# Patient Record
Sex: Female | Born: 1950
Health system: Southern US, Community
[De-identification: ages and names within clinical notes are randomized; demographics above are authoritative.]

## PROBLEM LIST (undated history)

## (undated) DIAGNOSIS — N2889 Other specified disorders of kidney and ureter: Secondary | ICD-10-CM

## (undated) DIAGNOSIS — N200 Calculus of kidney: Secondary | ICD-10-CM

## (undated) DIAGNOSIS — K5730 Diverticulosis of large intestine without perforation or abscess without bleeding: Secondary | ICD-10-CM

## (undated) DIAGNOSIS — I10 Essential (primary) hypertension: Secondary | ICD-10-CM

## (undated) DIAGNOSIS — K648 Other hemorrhoids: Secondary | ICD-10-CM

## (undated) DIAGNOSIS — K644 Residual hemorrhoidal skin tags: Secondary | ICD-10-CM

## (undated) DIAGNOSIS — K7689 Other specified diseases of liver: Secondary | ICD-10-CM

## (undated) DIAGNOSIS — E119 Type 2 diabetes mellitus without complications: Secondary | ICD-10-CM

## (undated) DIAGNOSIS — R011 Cardiac murmur, unspecified: Secondary | ICD-10-CM

## (undated) HISTORY — DX: Calculus of kidney: N20.0

## (undated) HISTORY — DX: Other hemorrhoids: K64.8

## (undated) HISTORY — DX: Type 2 diabetes mellitus without complications: E11.9

## (undated) HISTORY — DX: Diverticulosis of large intestine without perforation or abscess without bleeding: K57.30

## (undated) HISTORY — DX: Cardiac murmur, unspecified: R01.1

## (undated) HISTORY — DX: Residual hemorrhoidal skin tags: K64.4

## (undated) HISTORY — DX: Other specified diseases of liver: K76.89

## (undated) HISTORY — DX: Other specified disorders of kidney and ureter: N28.89

---

## 1981-02-24 HISTORY — PX: LITHOTRIPSY: SUR834

## 1983-02-25 HISTORY — PX: BREAST REDUCTION SURGERY: SHX8

## 1989-02-24 HISTORY — PX: ABDOMINAL HYSTERECTOMY: SHX81

## 1998-03-01 ENCOUNTER — Other Ambulatory Visit: Admission: RE | Admit: 1998-03-01 | Discharge: 1998-03-01 | Payer: Self-pay | Admitting: Obstetrics and Gynecology

## 1998-04-30 ENCOUNTER — Ambulatory Visit (HOSPITAL_COMMUNITY): Admission: RE | Admit: 1998-04-30 | Discharge: 1998-04-30 | Payer: Self-pay | Admitting: Gastroenterology

## 1998-10-09 ENCOUNTER — Encounter: Payer: Self-pay | Admitting: Gastroenterology

## 1998-10-09 ENCOUNTER — Ambulatory Visit (HOSPITAL_COMMUNITY): Admission: RE | Admit: 1998-10-09 | Discharge: 1998-10-09 | Payer: Self-pay | Admitting: Gastroenterology

## 1999-02-25 HISTORY — PX: HAND SURGERY: SHX662

## 2003-02-25 HISTORY — PX: LASIK: SHX215

## 2003-07-10 ENCOUNTER — Ambulatory Visit (HOSPITAL_COMMUNITY): Admission: RE | Admit: 2003-07-10 | Discharge: 2003-07-10 | Payer: Self-pay | Admitting: Gastroenterology

## 2005-09-26 ENCOUNTER — Encounter: Admission: RE | Admit: 2005-09-26 | Discharge: 2005-09-26 | Payer: Self-pay | Admitting: Gastroenterology

## 2005-10-03 ENCOUNTER — Encounter: Admission: RE | Admit: 2005-10-03 | Discharge: 2005-10-03 | Payer: Self-pay | Admitting: Gastroenterology

## 2008-03-27 ENCOUNTER — Emergency Department (HOSPITAL_COMMUNITY): Admission: EM | Admit: 2008-03-27 | Discharge: 2008-03-27 | Payer: Self-pay | Admitting: Emergency Medicine

## 2008-03-28 ENCOUNTER — Emergency Department (HOSPITAL_COMMUNITY): Admission: EM | Admit: 2008-03-28 | Discharge: 2008-03-28 | Payer: Self-pay | Admitting: Emergency Medicine

## 2008-07-25 ENCOUNTER — Encounter: Admission: RE | Admit: 2008-07-25 | Discharge: 2008-07-25 | Payer: Self-pay | Admitting: Gastroenterology

## 2009-03-13 ENCOUNTER — Encounter: Admission: RE | Admit: 2009-03-13 | Discharge: 2009-03-13 | Payer: Self-pay | Admitting: Otolaryngology

## 2010-06-11 LAB — HEMOGLOBIN AND HEMATOCRIT, BLOOD
HCT: 35.7 % — ABNORMAL LOW (ref 36.0–46.0)
Hemoglobin: 11.6 g/dL — ABNORMAL LOW (ref 12.0–15.0)

## 2010-07-12 NOTE — Op Note (Signed)
NAME:  Shannon Craig, Shannon Craig                           ACCOUNT NO.:  [id number]   MEDICAL RECORD NO.:  [id number]                   PATIENT TYPE:  AMB   LOCATION:  ENDO                                 FACILITY:  [facility]   PHYSICIAN:  Petra Kuba, M.D.                 DATE OF BIRTH:  10-02-1950   DATE OF PROCEDURE:  07/10/2003  DATE OF DISCHARGE:                                 OPERATIVE REPORT   PROCEDURE:  Colonoscopy.   ENDOSCOPIST:  Petra Kuba, M.D.   INDICATION:  The patient with questionable history of colon polyps, due for  repeat screening.  Consent was signed after risks, benefits, methods, and  options were thoroughly discussed multiple times in the past.   MEDICINES USED:  Demerol 90, Versed 9.   DESCRIPTION OF PROCEDURE:  Rectal inspection was pertinent for external  hemorrhoids, small.  Digital exam was negative.  Pediatric video adjustable  colonoscope was inserted and with some looping was able to be advanced to  the mid-transverse.  At that point, the patient was rolled on her back.  With abdominal pressure, we were able to advance to the hepatic flexure  which was very tortuous, but we were able to advance around it to the  ascending colon and to the cecum which was identified by the appendiceal  orifice and the ileocecal valve which was slightly lipomatous.  The scope  was then slowly withdrawn.  There was a rare right-sided diverticula with  some stool in the right side which could not be washed off.  Her hepatic  flexure was tortuous, but no other abnormalities were seen.  The scope was  further withdrawn.  Other than some scope trauma on the left side seen on  insertion primarily in the sigmoid, no other abnormalities were seen as we  slowly withdrew back to the rectum.  Anorectal pull through in retroflexion  confirmed some small hemorrhoids.  The scope was reinserted a short ways up  the left side of the colon. Air was suctioned, and the scope removed.  The  patient tolerated the procedure well.  There was no obvious immediate  complication.   ENDOSCOPIC DIAGNOSES:  1. Internal and external hemorrhoids.  2. Scope trauma in the sigmoid.  3. Hepatic flexure tortuosity.  4. Few right-sided diverticula.  5. Otherwise within normal limits to the cecum.   PLAN:  1. Yearly rectals and guaiac per the [facility] doctor.  2. Happy to see back p.r.n.  3. Repeat screening in five years.  Consider colonoscopy at that junction if     covered by insurance.                                               Petra Kuba, M.D.  MEM/MEDQ  D:  07/10/2003  T:  07/10/2003  Job:  [id number]   cc:   Daphene Jaeger, M.D.  [facility]

## 2010-12-09 ENCOUNTER — Other Ambulatory Visit: Payer: Self-pay | Admitting: Surgery

## 2011-09-03 ENCOUNTER — Encounter: Payer: Self-pay | Admitting: Gastroenterology

## 2011-09-03 ENCOUNTER — Ambulatory Visit (INDEPENDENT_AMBULATORY_CARE_PROVIDER_SITE_OTHER): Payer: 59 | Admitting: Gastroenterology

## 2011-09-03 VITALS — BP 134/86 | HR 88 | Ht <= 58 in | Wt 139.1 lb

## 2011-09-03 DIAGNOSIS — R933 Abnormal findings on diagnostic imaging of other parts of digestive tract: Secondary | ICD-10-CM

## 2011-09-03 NOTE — Patient Instructions (Addendum)
We will get copies of any recent liver tests through Dr. Juleen China, and Dr. Providence Lanius at [facility] (very interested in any liver tests).  You will repeat labs if those are not complete (AFP, LFTs, cbc). A copy of this information will be made available to Dr. Juleen China, Dr. Providence Lanius, Dr. Ewing Schlein. We will get copies of recent MRI, ultrasound to Radiologist in [place] to review, comparing to old scans of liver. Will call with results.

## 2011-09-03 NOTE — Progress Notes (Addendum)
HPI: This is a   very pleasant 61 year old woman whom I am meeting for the first time today.  She is along term patient of Dr. Ewing Schlein and plans to continue seeing him for colon cancer screening in the future.  She had a colonoscopy with Dr. Ewing Schlein in 06/2003, this was done for "questionable history of polyps in the past".    No polyps were found, he describes a tortuous colon, right sided tics. He recommended yearly Hemoccult tests following the examination as well as "repeat screening in 5 years, consider colonosocpy."  She has had abnormal lesions in her liver dating at least as far back as 2007 based on imaging I have available in this electronic medical record. Back in 2007 a CT scan describes multiple complex cysts in her liver. Most recently she had an MRI of her abdomen (07/2011). This also describes multiple complex hepatic nodules with indeterminate MR imaging characteristics. The largest is 4.6 x 5.7 cm, located at the hepatic dome. I only have the report here, not the images. The report does state that there is some fluid signal intensity within this lesion.  She never heard about previous scans, liver lesions.  Just now knows about liver lesions.  Never had jaundice or hepatitis.  Overall her weight is decreasing (trying to lose weight 40 pounds in 1.5 years).  No liver disease in family.   6-7 weeks ago she had very high fever 102.7, was found to have UTI.  Pain RUQ, thought maybe to have GB issue.  She had [place], then MRI follow up.  She was given z pack at first, then amoxicillin when UTI culture was back.  She tells me that she had abnormal liver tests as well however I have no record of any lab tests being done in the past 2-3 years.  An ultrasound of her abdomen done last month shows normal gallbladder. Multifocal hepatic nodules that were "not indicative of simple cysts"    Review of systems: Pertinent positive and negative review of systems were noted in the above HPI section.  Complete review of systems was performed and was otherwise normal.    Past Medical History  Diagnosis Date  . Internal hemorrhoid   . External hemorrhoid   . Diverticula of colon   . Diabetes mellitus   . Nodule of kidney   . Liver nodule   . Kidney stones     Past Surgical History  Procedure Date  . Lithotripsy 1983  . Breast reduction surgery 1985  . Abdominal hysterectomy 1991  . Hand surgery 2001    left, pins  . Lasik 2005    bilateral    Current Outpatient Prescriptions  Medication Sig Dispense Refill  . Calcium-Vitamin D-Vitamin K (VIACTIV PO) Take 1 tablet by mouth daily.      . Multiple Vitamin (MULTIVITAMIN) tablet Take 1 tablet by mouth daily.        Allergies as of 09/03/2011 - Review Complete 09/03/2011  Allergen Reaction Noted  . Darvocet (propoxyphene-acetaminophen)  09/03/2011  . Percodan (oxycodone-aspirin)  09/03/2011    Family History  Problem Relation Age of Onset  . Breast cancer Mother   . Diabetes Father   . Prostate cancer Maternal Uncle   . Stroke Maternal Grandmother   . Heart attack Maternal Grandfather   . Diabetes Brother     History   Social History  . Marital Status: Married    Spouse Name: N/A    Number of Children: 0  .  Years of Education: N/A   Occupational History  . housewife    Social History Main Topics  . Smoking status: Never Smoker   . Smokeless tobacco: Never Used  . Alcohol Use: No  . Drug Use: No  . Sexually Active: Not on file   Other Topics Concern  . Not on file   Social History Narrative  . No narrative on file       Physical Exam: BP 134/86  Pulse 88  Ht 4\' 9"  (1.448 m)  Wt 139 lb 2 oz (63.107 kg)  BMI 30.11 kg/m2 Constitutional: generally well-appearing Psychiatric: alert and oriented x3 Eyes: extraocular movements intact Mouth: oral pharynx moist, no lesions Neck: supple no lymphadenopathy Cardiovascular: heart regular rate and rhythm Lungs: clear to auscultation  bilaterally Abdomen: soft, nontender, nondistended, no obvious ascites, no peritoneal signs, normal bowel sounds Extremities: no lower extremity edema bilaterally Skin: no lesions on visible extremities    Assessment and plan: 61 y.o. female with  abnormal lesions in the liver  She has an abnormal liver dating back at least to 2007. I am going to have the films on disc which she brought over reviewed by one of the local radiologist, Dr. Jeronimo Greaves so that he may compare the new images with the old images that we have available here. I explained to her that my suspicion for cancer is low. I do think however it is interesting that she had high fevers, right upper quadrant pain around that time that this was discovered. She has always had several smaller cysts in the liver dating back to 2007 but the recent MRI suggests a larger process, somewhat fluid filled. This is up to 5-6 cm in size. Perhaps this was a hepatic abscess. I would've expected her to become a bit sicker and not to respond to Z-Pak or amoxicillin like she did. She was found instead to have a urinary tract infection.    I reviewed liver tests done early June 2013 and these were all completely normal.

## 2011-09-15 ENCOUNTER — Telehealth: Payer: Self-pay | Admitting: Gastroenterology

## 2011-09-15 NOTE — Telephone Encounter (Signed)
Please call her.  Apologize for the delay (vacation last week).  Let her know I am going to be reviewing the liver images with radiologist at [facility] on Wednesday or Thursday (she is out of town today and tomorrow). Will contact her after that.

## 2011-09-17 NOTE — Telephone Encounter (Signed)
Pt.notified

## 2011-09-19 ENCOUNTER — Telehealth: Payer: Self-pay | Admitting: Gastroenterology

## 2011-09-19 DIAGNOSIS — K769 Liver disease, unspecified: Secondary | ICD-10-CM

## 2011-09-19 NOTE — Telephone Encounter (Signed)
Pt has been notified disc has been sent interoffice mail to [facility] radiology scheduling pt will come in on Mon for labs

## 2011-09-19 NOTE — Telephone Encounter (Signed)
Number given to Dr Christella Hartigan regarding radiologist and info on pt

## 2011-09-19 NOTE — Telephone Encounter (Signed)
I spoke with Dr. Vear Clock, radiology.  She compared to old films.  The larger lesion (dome of liver 5-6cm is predominantly solid and is new).    Patty, Can you forward the discs on my desk over to interventional radiology so that they can review and consider biopsy of the new liver lesion.  She also needs labs (AFP, CEA, CA 19-9, LFTs), she is planning to come Monday for the bloodwork.  Thanks

## 2011-09-19 NOTE — Telephone Encounter (Signed)
Left message on machine to call back  

## 2011-09-19 NOTE — Telephone Encounter (Signed)
Patty, Can you please contact [facility] about this patient.  [phone].  I spoke with a woman there earlier this week who said a radiologist was going to be comparing her [facility] (recent) tests to her older scans done through [facility]. I was told the radiologist would be contacting me Wednesday or Thursday but have not heard from anyone yet.  Thanks

## 2011-09-22 ENCOUNTER — Other Ambulatory Visit: Payer: Self-pay

## 2011-09-22 ENCOUNTER — Other Ambulatory Visit (INDEPENDENT_AMBULATORY_CARE_PROVIDER_SITE_OTHER): Payer: 59

## 2011-09-22 DIAGNOSIS — K769 Liver disease, unspecified: Secondary | ICD-10-CM

## 2011-09-22 DIAGNOSIS — K7689 Other specified diseases of liver: Secondary | ICD-10-CM

## 2011-09-22 LAB — AFP TUMOR MARKER: AFP-Tumor Marker: 2.4 ng/mL (ref 0.0–8.0)

## 2011-09-22 LAB — HEPATIC FUNCTION PANEL
ALT: 17 U/L (ref 0–35)
AST: 18 U/L (ref 0–37)
Bilirubin, Direct: 0.1 mg/dL (ref 0.0–0.3)
Total Bilirubin: 0.6 mg/dL (ref 0.3–1.2)

## 2011-09-24 ENCOUNTER — Other Ambulatory Visit: Payer: Self-pay

## 2011-09-24 DIAGNOSIS — K769 Liver disease, unspecified: Secondary | ICD-10-CM

## 2011-09-25 ENCOUNTER — Other Ambulatory Visit (INDEPENDENT_AMBULATORY_CARE_PROVIDER_SITE_OTHER): Payer: 59

## 2011-09-25 ENCOUNTER — Telehealth: Payer: Self-pay

## 2011-09-25 DIAGNOSIS — K769 Liver disease, unspecified: Secondary | ICD-10-CM

## 2011-09-25 DIAGNOSIS — K7689 Other specified diseases of liver: Secondary | ICD-10-CM

## 2011-09-25 LAB — BUN: BUN: 11 mg/dL (ref 6–23)

## 2011-09-25 LAB — CREATININE, SERUM: Creatinine, Ser: 0.7 mg/dL (ref 0.4–1.2)

## 2011-09-25 NOTE — Telephone Encounter (Signed)
Patient came by to report her medications.  She is on a new invokana 300 mg 1 po qd.  This is a new diabetes medication.  Per Rose at CT because this is not a metformin class of medication it does not need held for CT scan.  Patient is advised

## 2011-09-26 ENCOUNTER — Other Ambulatory Visit: Payer: 59

## 2011-09-26 ENCOUNTER — Ambulatory Visit (INDEPENDENT_AMBULATORY_CARE_PROVIDER_SITE_OTHER)
Admission: RE | Admit: 2011-09-26 | Discharge: 2011-09-26 | Disposition: A | Discharge status: Home / Self Care | Payer: 59 | Source: Ambulatory Visit | Attending: Gastroenterology | Admitting: Gastroenterology

## 2011-09-26 DIAGNOSIS — K7689 Other specified diseases of liver: Secondary | ICD-10-CM

## 2011-09-26 DIAGNOSIS — K769 Liver disease, unspecified: Secondary | ICD-10-CM

## 2011-09-26 MED ORDER — IOHEXOL 350 MG/ML SOLN
100.0000 mL | Freq: Once | INTRAVENOUS | Status: AC | PRN
  Administered 2011-09-26: 100 mL via INTRAVENOUS

## 2011-12-01 ENCOUNTER — Telehealth: Payer: Self-pay

## 2011-12-01 DIAGNOSIS — K769 Liver disease, unspecified: Secondary | ICD-10-CM

## 2011-12-01 NOTE — Telephone Encounter (Signed)
Message copied by Donata Duff on Mon Dec 01, 2011  8:22 AM ------      Message from: Donata Duff      Created: Tue Sep 30, 2011 10:15 AM       MRI of the liver in 2 months

## 2011-12-02 NOTE — Telephone Encounter (Signed)
Pt has been scheduled for an MRI liver 12/09/11 1145 am arrival NPO midnight pt notified of the instructions

## 2011-12-09 ENCOUNTER — Ambulatory Visit (HOSPITAL_COMMUNITY): Payer: 59

## 2012-09-29 ENCOUNTER — Telehealth: Payer: Self-pay

## 2012-09-29 DIAGNOSIS — K769 Liver disease, unspecified: Secondary | ICD-10-CM

## 2012-09-29 NOTE — Telephone Encounter (Signed)
Message copied by Donata Duff on Wed Sep 29, 2012  8:18 AM ------      Message from: Donata Duff      Created: Tue Sep 30, 2011 10:15 AM       chest CT scan in 12 months ------

## 2012-09-29 NOTE — Telephone Encounter (Signed)
Left message on machine to call back  

## 2012-09-30 ENCOUNTER — Other Ambulatory Visit: Payer: Self-pay

## 2012-09-30 DIAGNOSIS — K769 Liver disease, unspecified: Secondary | ICD-10-CM

## 2012-09-30 DIAGNOSIS — R911 Solitary pulmonary nodule: Secondary | ICD-10-CM

## 2012-09-30 NOTE — Telephone Encounter (Signed)
Leb CT chest 10/01/12 10 am NPO 2 hours The patient has been notified of this information and all questions answered.

## 2012-10-01 ENCOUNTER — Ambulatory Visit (INDEPENDENT_AMBULATORY_CARE_PROVIDER_SITE_OTHER)
Admission: RE | Admit: 2012-10-01 | Discharge: 2012-10-01 | Disposition: A | Discharge status: Home / Self Care | Payer: 59 | Source: Ambulatory Visit | Attending: Gastroenterology | Admitting: Gastroenterology

## 2012-10-01 ENCOUNTER — Other Ambulatory Visit: Payer: 59

## 2012-10-01 DIAGNOSIS — R911 Solitary pulmonary nodule: Secondary | ICD-10-CM

## 2012-10-01 DIAGNOSIS — J984 Other disorders of lung: Secondary | ICD-10-CM

## 2012-10-01 MED ORDER — IOHEXOL 300 MG/ML  SOLN
80.0000 mL | Freq: Once | INTRAMUSCULAR | Status: AC | PRN
  Administered 2012-10-01: 80 mL via INTRAVENOUS

## 2012-11-10 ENCOUNTER — Other Ambulatory Visit (INDEPENDENT_AMBULATORY_CARE_PROVIDER_SITE_OTHER): Payer: 59

## 2012-11-10 ENCOUNTER — Encounter: Payer: Self-pay | Admitting: Gastroenterology

## 2012-11-10 ENCOUNTER — Ambulatory Visit (INDEPENDENT_AMBULATORY_CARE_PROVIDER_SITE_OTHER): Payer: 59 | Admitting: Gastroenterology

## 2012-11-10 VITALS — BP 120/70 | HR 83 | Ht <= 58 in | Wt 144.0 lb

## 2012-11-10 DIAGNOSIS — K7689 Other specified diseases of liver: Secondary | ICD-10-CM

## 2012-11-10 DIAGNOSIS — R911 Solitary pulmonary nodule: Secondary | ICD-10-CM

## 2012-11-10 DIAGNOSIS — J984 Other disorders of lung: Secondary | ICD-10-CM

## 2012-11-10 DIAGNOSIS — K769 Liver disease, unspecified: Secondary | ICD-10-CM

## 2012-11-10 LAB — HEPATIC FUNCTION PANEL
ALT: 18 U/L (ref 0–35)
Albumin: 3.6 g/dL (ref 3.5–5.2)
Total Bilirubin: 0.7 mg/dL (ref 0.3–1.2)

## 2012-11-10 NOTE — Progress Notes (Signed)
Review of pertinent gastrointestinal problems: 1. Abnormal liver on imaging: She has had abnormal lesions in her liver dating at least as far back as 2007 based on imaging I have available in this electronic medical record. Back in 2007 a CT scan describes multiple complex cysts in her liver. Most recently she had an MRI of her abdomen (07/2011). This also describes multiple complex hepatic nodules with indeterminate MR imaging characteristics. The largest is 4.6 x 5.7 cm, located at the hepatic dome. I only have the report here, not the images. The report does state that there is some fluid signal intensity within this lesion.  Labs 2013: lfts, afp all normal.  CT scan 2013: I discussed the CT scan results with radiologist in [place] . It seems that there has probably actually been no change in the liver lesions in 5 or 6 years. The quality of the [facility] MRI was brought into question. To be safe, radiologists here recommended repeat MRI of the liver in 2 months (patient cancelled this for cost reasons).  2. 2013 lung lesion on CT: Also the CT scan that was just recently done showed a very small lung nodule, radiologist recommended repeat CT scan of the chest in 12 months time since she has no risk factors (she does not smoke cigarettes and no family history of lung cancer).  Repeat CT scan chest 2 months later showed stable lesions, recommended repeat CT in 12 months.  09/2012: lung lesions no changes (8mm by 6mm), again radiologist recommended repeat CT in 12 months. 3. colonoscopy with Dr. Ewing Schlein in 06/2003, this was done for "questionable history of polyps in the past". No polyps were found, he describes a tortuous colon, right sided tics. He recommended yearly Hemoccult tests following the examination as well as "repeat screening in 5 years, consider colonosocpy."   HPI: This is a  very pleasant 62 year old woman whom I last saw about a year ago.  I have been following abnormal liver, also incidentally  noted lung lesion.  She feels absolutely fine. No pulmonary symptoms, no abdominal symptoms.  Stable weight.  No pains.  No overt gi bleeding.  Breathing comfortably.  Non smoker.    Bowels normal for her.   Past Medical History  Diagnosis Date  . Internal hemorrhoid   . External hemorrhoid   . Diverticula of colon   . Diabetes mellitus   . Nodule of kidney   . Liver nodule   . Kidney stones     Past Surgical History  Procedure Laterality Date  . Lithotripsy  1983  . Breast reduction surgery  1985  . Abdominal hysterectomy  1991  . Hand surgery  2001    left, pins  . Lasik  2005    bilateral    Current Outpatient Prescriptions  Medication Sig Dispense Refill  . Alogliptin Benzoate (NESINA) 25 MG TABS Take by mouth.      Marland Kitchen atorvastatin (LIPITOR) 40 MG tablet Take 40 mg by mouth daily.      . Calcium-Vitamin D-Vitamin K (VIACTIV PO) Take 1 tablet by mouth daily.      . Canagliflozin (INVOKANA) 300 MG TABS Take 300 mg by mouth daily.  30 tablet    . meloxicam (MOBIC) 15 MG tablet       . Multiple Vitamin (MULTIVITAMIN) tablet Take 1 tablet by mouth daily.      . ramipril (ALTACE) 5 MG capsule Take 5 mg by mouth daily.       No current  facility-administered medications for this visit.    Allergies as of 11/10/2012 - Review Complete 11/10/2012  Allergen Reaction Noted  . Darvocet [propoxyphene-acetaminophen]  09/03/2011  . Gadolinium derivatives Hives 09/25/2011  . Percodan [oxycodone-aspirin]  09/03/2011    Family History  Problem Relation Age of Onset  . Breast cancer Mother   . Diabetes Father   . Prostate cancer Maternal Uncle   . Stroke Maternal Grandmother   . Heart attack Maternal Grandfather   . Diabetes Brother     History   Social History  . Marital Status: Married    Spouse Name: N/A    Number of Children: 0  . Years of Education: N/A   Occupational History  . housewife    Social History Main Topics  . Smoking status: Never Smoker   .  Smokeless tobacco: Never Used  . Alcohol Use: No  . Drug Use: No  . Sexual Activity: Not on file   Other Topics Concern  . Not on file   Social History Narrative  . No narrative on file      Physical Exam: BP 120/70  Pulse 83  Ht 4\' 9"  (1.448 m)  Wt 144 lb (65.318 kg)  BMI 31.15 kg/m2  SpO2 97% Constitutional: generally well-appearing Psychiatric: alert and oriented x3 Abdomen: soft, nontender, nondistended, no obvious ascites, no peritoneal signs, normal bowel sounds     Assessment and plan: 62 y.o. female with abnormal liver on imaging, small lung lesion incidentally noted on imaging   She has had an abnormal appearing liver since at least 2007. most recent CT scan it was actually done of her chest seems to suggest that the liver lesions are resolving or at least not growing although admittedly it was not a dedicated liver scan. I spoke with radiologist about her imaging of the liver in 2013. I suspect that these are not neoplastic lesions. She cannot afford MRI in 2013 and knows she cannot afford one now either. I think given the stability of the lesions over 7 years that we can stop imaging for now. I will however get a set of liver tests and alpha-fetoprotein today. The lung lesion is subcentimeter, radiologists recommend repeat CT scan H. 2015 I will set that up for her. She had colon cancer screening with colonoscopy with Dr.  Ewing Schlein in the past. We will get those records sent over here for review to determine when she is due for next colon cancer screening.

## 2012-11-10 NOTE — Patient Instructions (Addendum)
You will have labs checked today in the basement lab.  Please head down after you check out with the front desk  (lfts, AFP). We will call to Dr. Marlane Hatcher office to get your previous colonoscopy reports and any associated pathology.  Dr. Christella Hartigan will review those records to determine when you need repeat colon cancer screening. Will remind you about repeat CT scan of chest in 09/2013.

## 2012-11-11 LAB — AFP TUMOR MARKER: AFP-Tumor Marker: 1.4 ng/mL (ref 0.0–8.0)

## 2012-12-20 ENCOUNTER — Telehealth: Payer: Self-pay | Admitting: Gastroenterology

## 2012-12-20 NOTE — Telephone Encounter (Signed)
Colonoscopy, Dr. Ewing Schlein 04/1998; done for guiac pos stool and 'almost due for colonic screening.' Findings internal hemorrhoids, scope trauma to sigmoid, no polyps Colonoscopy, Dr. Ewing Schlein 06/2003; for "questionable history of colon polyps." findings hemorrhoids, tics, no polyps. Colonoscopy, Dr. Ewing Schlein 08/2008; for "personal history of colonic polyps, IDA." Findings hemorrhoids, tics, no polyps.  Recommended; repeat colonoscopy in 5 years.  Shannon Craig, Please call her.  I cannot find any documentation of previous adenomatous polyps.  She is at routine risk for colon cancer screening and needs recall colonoscopy 08/2018.  Thanks

## 2012-12-20 NOTE — Telephone Encounter (Signed)
Recall has been added to EPIC Left message on machine to call back  

## 2012-12-21 NOTE — Telephone Encounter (Signed)
The patient has been notified of this information and all questions answered.

## 2012-12-21 NOTE — Telephone Encounter (Signed)
Left message on machine to call back  

## 2013-09-26 ENCOUNTER — Telehealth: Payer: Self-pay

## 2013-09-26 NOTE — Telephone Encounter (Signed)
Message copied by Barron Alvine on Mon Sep 26, 2013  8:15 AM ------      Message from: Barron Alvine      Created: Wed Nov 10, 2012  9:00 AM       Pt needs CT chest 09/2013 see 11/10/12 note  ------

## 2013-09-28 NOTE — Telephone Encounter (Signed)
The pt wants to call back to set up appt for CT of the chest

## 2013-09-30 ENCOUNTER — Other Ambulatory Visit: Payer: Self-pay | Admitting: Gastroenterology

## 2013-09-30 DIAGNOSIS — R911 Solitary pulmonary nodule: Secondary | ICD-10-CM

## 2013-09-30 DIAGNOSIS — R932 Abnormal findings on diagnostic imaging of liver and biliary tract: Secondary | ICD-10-CM

## 2013-09-30 DIAGNOSIS — R109 Unspecified abdominal pain: Secondary | ICD-10-CM

## 2013-10-05 ENCOUNTER — Ambulatory Visit
Admission: RE | Admit: 2013-10-05 | Discharge: 2013-10-05 | Disposition: A | Discharge status: Home / Self Care | Payer: 59 | Source: Ambulatory Visit | Attending: Gastroenterology | Admitting: Gastroenterology

## 2013-10-05 ENCOUNTER — Other Ambulatory Visit: Payer: Self-pay | Admitting: Gastroenterology

## 2013-10-05 DIAGNOSIS — R109 Unspecified abdominal pain: Secondary | ICD-10-CM

## 2013-10-05 DIAGNOSIS — R932 Abnormal findings on diagnostic imaging of liver and biliary tract: Secondary | ICD-10-CM

## 2013-10-05 DIAGNOSIS — R911 Solitary pulmonary nodule: Secondary | ICD-10-CM

## 2013-10-05 MED ORDER — IOHEXOL 350 MG/ML SOLN
100.0000 mL | Freq: Once | INTRAVENOUS | Status: AC | PRN
  Administered 2013-10-05: 100 mL via INTRAVENOUS

## 2015-07-21 ENCOUNTER — Emergency Department (HOSPITAL_BASED_OUTPATIENT_CLINIC_OR_DEPARTMENT_OTHER): Payer: Commercial Managed Care - HMO

## 2015-07-21 ENCOUNTER — Emergency Department (HOSPITAL_COMMUNITY): Payer: Commercial Managed Care - HMO

## 2015-07-21 ENCOUNTER — Encounter (HOSPITAL_BASED_OUTPATIENT_CLINIC_OR_DEPARTMENT_OTHER): Payer: Self-pay

## 2015-07-21 ENCOUNTER — Telehealth (HOSPITAL_BASED_OUTPATIENT_CLINIC_OR_DEPARTMENT_OTHER): Payer: Self-pay

## 2015-07-21 ENCOUNTER — Emergency Department (HOSPITAL_BASED_OUTPATIENT_CLINIC_OR_DEPARTMENT_OTHER)
Admission: EM | Admit: 2015-07-21 | Discharge: 2015-07-21 | Disposition: A | Discharge status: Home / Self Care | Payer: Commercial Managed Care - HMO | Attending: Emergency Medicine | Admitting: Emergency Medicine

## 2015-07-21 DIAGNOSIS — Z79899 Other long term (current) drug therapy: Secondary | ICD-10-CM | POA: Insufficient documentation

## 2015-07-21 DIAGNOSIS — R52 Pain, unspecified: Secondary | ICD-10-CM

## 2015-07-21 DIAGNOSIS — M79604 Pain in right leg: Secondary | ICD-10-CM

## 2015-07-21 DIAGNOSIS — I10 Essential (primary) hypertension: Secondary | ICD-10-CM | POA: Diagnosis not present

## 2015-07-21 DIAGNOSIS — M79661 Pain in right lower leg: Secondary | ICD-10-CM | POA: Insufficient documentation

## 2015-07-21 DIAGNOSIS — E119 Type 2 diabetes mellitus without complications: Secondary | ICD-10-CM | POA: Insufficient documentation

## 2015-07-21 HISTORY — DX: Essential (primary) hypertension: I10

## 2015-07-21 LAB — BASIC METABOLIC PANEL
Anion gap: 9 (ref 5–15)
BUN: 12 mg/dL (ref 6–20)
CALCIUM: 9.4 mg/dL (ref 8.9–10.3)
CO2: 25 mmol/L (ref 22–32)
CREATININE: 0.57 mg/dL (ref 0.44–1.00)
Chloride: 101 mmol/L (ref 101–111)
GFR calc Af Amer: >60 mL/min (ref >60)
GLUCOSE: 175 mg/dL — AB (ref 65–99)
POTASSIUM: 3.2 mmol/L — AB (ref 3.5–5.1)
SODIUM: 135 mmol/L (ref 135–145)

## 2015-07-21 LAB — CBC WITH DIFFERENTIAL/PLATELET
BASOS PCT: 0 %
Basophils Absolute: 0 10*3/uL (ref 0.0–0.1)
EOS ABS: 0.1 10*3/uL (ref 0.0–0.7)
Eosinophils Relative: 1 %
HCT: 42.4 % (ref 36.0–46.0)
Hemoglobin: 15.4 g/dL — ABNORMAL HIGH (ref 12.0–15.0)
LYMPHS ABS: 1.3 10*3/uL (ref 0.7–4.0)
Lymphocytes Relative: 20 %
MCH: 30.2 pg (ref 26.0–34.0)
MCHC: 36.3 g/dL — ABNORMAL HIGH (ref 30.0–36.0)
MCV: 83.1 fL (ref 78.0–100.0)
MONO ABS: 0.5 10*3/uL (ref 0.1–1.0)
Monocytes Relative: 8 %
NEUTROS PCT: 71 %
Neutro Abs: 4.8 10*3/uL (ref 1.7–7.7)
PLATELETS: 103 10*3/uL — AB (ref 150–400)
RBC: 5.1 MIL/uL (ref 3.87–5.11)
RDW: 13.8 % (ref 11.5–15.5)
SMEAR REVIEW: DECREASED
WBC: 6.7 10*3/uL (ref 4.0–10.5)

## 2015-07-21 MED ORDER — TRAMADOL HCL 50 MG PO TABS: 50.0000 mg | ORAL_TABLET | Freq: Four times a day (QID) | ORAL | Status: DC | PRN

## 2015-07-21 MED ORDER — POTASSIUM CHLORIDE CRYS ER 20 MEQ PO TBCR
40.0000 meq | EXTENDED_RELEASE_TABLET | Freq: Once | ORAL | Status: AC
  Administered 2015-07-21: 40 meq via ORAL
  Filled 2015-07-21: qty 2

## 2015-07-21 NOTE — ED Provider Notes (Signed)
CSN: [id number]     Arrival date & time 07/21/15  1210 History   First MD Initiated Contact with Patient 07/21/15 1250     Chief Complaint  Patient presents with  . Leg Pain     (Consider location/radiation/quality/duration/timing/severity/associated sxs/prior Treatment) HPI Complains of pain at right calf for the past 1 week. Pain is nonradiating not made better or worse by anything no fever no trauma no chest pain no shortness of breath no other associated symptoms. Treated with ibuprofen and Tylenol, without relief. Nothing makes symptoms better or worse Past Medical History  Diagnosis Date  . Internal hemorrhoid   . External hemorrhoid   . Diverticula of colon   . Diabetes mellitus ([place])   . Nodule of kidney   . Liver nodule   . Kidney stones   . Hypertension    Past Surgical History  Procedure Laterality Date  . Lithotripsy  1983  . Breast reduction surgery  1985  . Abdominal hysterectomy  1991  . Hand surgery  2001    left, pins  . Lasik  2005    bilateral   Family History  Problem Relation Age of Onset  . Breast cancer Mother   . Diabetes Father   . Prostate cancer Maternal Uncle   . Stroke Maternal Grandmother   . Heart attack Maternal Grandfather   . Diabetes Brother    Social History  Substance Use Topics  . Smoking status: Never Smoker   . Smokeless tobacco: Never Used  . Alcohol Use: No   OB History    No data available     Review of Systems  Constitutional: Negative.   HENT: Negative.   Respiratory: Negative.   Cardiovascular: Negative.   Gastrointestinal: Negative.   Musculoskeletal: Positive for myalgias.       Right calf pain  Skin: Negative.   Allergic/Immunologic: Positive for immunocompromised state.       Diabetic  Neurological: Negative.   Psychiatric/Behavioral: Negative.   All other systems reviewed and are negative.     Allergies  Darvocet; Gadolinium derivatives; and Percodan  Home Medications   Prior to Admission  medications   Medication Sig Start Date End Date Taking? Authorizing Provider  Albiglutide (TANZEUM) 50 MG PEN Inject into the skin.   Yes Historical Provider, MD  Calcium-Vitamin D-Vitamin K (VIACTIV PO) Take 1 tablet by mouth daily.   Yes Historical Provider, MD  empagliflozin (JARDIANCE) 25 MG TABS tablet Take 25 mg by mouth daily.   Yes Historical Provider, MD  InFLIXimab (REMICADE IV) Inject into the vein.   Yes Historical Provider, MD  Multiple Vitamin (MULTIVITAMIN) tablet Take 1 tablet by mouth daily.   Yes Historical Provider, MD  pioglitazone (ACTOS) 15 MG tablet Take 15 mg by mouth daily.   Yes Historical Provider, MD  ramipril (ALTACE) 5 MG capsule Take 5 mg by mouth daily.   Yes Historical Provider, MD  Alogliptin Benzoate (NESINA) 25 MG TABS Take by mouth.    Historical Provider, MD  atorvastatin (LIPITOR) 40 MG tablet Take 40 mg by mouth daily.    Historical Provider, MD  Canagliflozin (INVOKANA) 300 MG TABS Take 300 mg by mouth daily. 09/25/11   Milus Banister, MD  meloxicam [facility]) 15 MG tablet  08/27/12   Historical Provider, MD   BP 168/96 mmHg  Pulse 94  Temp(Src) 98.1 F (36.7 C) (Oral)  Resp 20  Ht 4\' 9"  (1.448 m)  Wt 140 lb (63.504 kg)  BMI 30.29 kg/m2  SpO2 98% Physical Exam  Constitutional: She appears well-developed and well-nourished. No distress.  HENT:  Head: Normocephalic and atraumatic.  Eyes: Conjunctivae are normal. Pupils are equal, round, and reactive to light.  Neck: Neck supple. No tracheal deviation present. No thyromegaly present.  Cardiovascular: Normal rate and regular rhythm.   No murmur heard. Pulmonary/Chest: Effort normal and breath sounds normal.  Abdominal: Soft. Bowel sounds are normal. She exhibits no distension. There is no tenderness.  Musculoskeletal: Normal range of motion. She exhibits tenderness. She exhibits no edema.  Right lower extremity not red or warm or swollen. She is tender at calf. Negative Homans sign. DP pulses 2+  bilaterally. Good capillary refill. Full range of motion. All other extremity is redness or tenderness neurovascularly intact  Neurological: She is alert. Coordination normal.  Skin: Skin is warm and dry. No rash noted.  Psychiatric: She has a normal mood and affect.  Nursing note and vitals reviewed.   ED Course  Procedures (including critical care time) Labs Review Labs Reviewed - No data to display  Imaging Review No results found. I have personally reviewed and evaluated these images and lab results as part of my medical decision-making.   EKG Interpretation None     Declines pain medicine Results for orders placed or performed during the hospital encounter of 07/21/15  CBC with Differential/Platelet  Result Value Ref Range   WBC 6.7 4.0 - 10.5 K/uL   RBC 5.10 3.87 - 5.11 MIL/uL   Hemoglobin 15.4 (H) 12.0 - 15.0 g/dL   HCT 42.4 36.0 - 46.0 %   MCV 83.1 78.0 - 100.0 fL   MCH 30.2 26.0 - 34.0 pg   MCHC 36.3 (H) 30.0 - 36.0 g/dL   RDW 13.8 11.5 - 15.5 %   Platelets 103 (L) 150 - 400 K/uL   Neutrophils Relative % 71 %   Lymphocytes Relative 20 %   Monocytes Relative 8 %   Eosinophils Relative 1 %   Basophils Relative 0 %   Neutro Abs 4.8 1.7 - 7.7 K/uL   Lymphs Abs 1.3 0.7 - 4.0 K/uL   Monocytes Absolute 0.5 0.1 - 1.0 K/uL   Eosinophils Absolute 0.1 0.0 - 0.7 K/uL   Basophils Absolute 0.0 0.0 - 0.1 K/uL   Smear Review PLATELETS APPEAR DECREASED   Basic metabolic panel  Result Value Ref Range   Sodium 135 135 - 145 mmol/L   Potassium 3.2 (L) 3.5 - 5.1 mmol/L   Chloride 101 101 - 111 mmol/L   CO2 25 22 - 32 mmol/L   Glucose, Bld 175 (H) 65 - 99 mg/dL   BUN 12 6 - 20 mg/dL   Creatinine, Ser 0.57 0.44 - 1.00 mg/dL   Calcium 9.4 8.9 - 10.3 mg/dL   GFR calc non Af Amer >60 >60 mL/min   GFR calc Af Amer >60 >60 mL/min   Anion gap 9 5 - 15   US Venous Img Lower Unilateral Right  07/21/2015  CLINICAL DATA:  Right calf pain with mild swelling. EXAM: RIGHT LOWER  EXTREMITY VENOUS DOPPLER ULTRASOUND TECHNIQUE: Gray-scale sonography with graded compression, as well as color Doppler and duplex ultrasound were performed to evaluate the lower extremity deep venous systems from the level of the common femoral vein and including the common femoral, femoral, profunda femoral, popliteal and calf veins including the posterior tibial, peroneal and gastrocnemius veins when visible. The superficial great saphenous vein was also interrogated. Spectral Doppler was utilized to evaluate flow at rest and  with distal augmentation maneuvers in the common femoral, femoral and popliteal veins. COMPARISON:  None. FINDINGS: Contralateral Common Femoral Vein: Respiratory phasicity is normal and symmetric with the symptomatic side. No evidence of thrombus. Normal compressibility. Common Femoral Vein: No evidence of thrombus. Normal compressibility, respiratory phasicity and response to augmentation. Saphenofemoral Junction: No evidence of thrombus. Normal compressibility and flow on color Doppler imaging. Profunda Femoral Vein: No evidence of thrombus. Normal compressibility and flow on color Doppler imaging. Femoral Vein: No evidence of thrombus. Normal compressibility, respiratory phasicity and response to augmentation. Popliteal Vein: No evidence of thrombus. Normal compressibility, respiratory phasicity and response to augmentation. Calf Veins: No evidence of thrombus. Normal compressibility and flow on color Doppler imaging. Superficial Great Saphenous Vein: No evidence of thrombus. Normal compressibility and flow on color Doppler imaging. Venous Reflux:  None. Other Findings:  None. IMPRESSION: No evidence of deep venous thrombosis. Electronically Signed   By: Kathreen Devoid   On: 07/21/2015 15:41    MDM  Pretest clinical suspicion for DVT moderate Final diagnoses:  None  No evidence of DVT on ultrasound. No evidence of infection or vascular compromise Plan prescription Ultram. She'll be  administered potassium chloride 40 no: By mouth prior to discharge. Follow-up with PCP Dr. Wilson Singer if significant pain by next week Diagnosis #1 pain in right calf #2 hypokalemia #3 hyperglycemia     Orlie Dakin, MD 07/21/15 1557

## 2015-07-21 NOTE — Discharge Instructions (Signed)
No evidence of blood clot in your right leg. Take the medication prescribed as needed for pain. You can also hold an ice pack on your right calf 4 times daily for 30 minutes at a time, and elevate your leg above your heart which should help with discomfort. Your blood potassium today was 3.2, slightly low. Blood sugar was 175, slightly elevated. Contact Dr.Kohut to be seen in the office if having significant pain by next week.

## 2015-07-21 NOTE — ED Notes (Signed)
Pt states right lower leg pain for 1 week with progressive worsening.  Pt seen at UC this morning and advised to come to ED for further eval.  Denies SOB, in NAD at this time.

## 2015-07-21 NOTE — ED Notes (Signed)
Previous notes documented as EM, entered by Apolonio Schneiders, Therapist, sports.

## 2015-07-21 NOTE — ED Notes (Signed)
Pt remains in ultrasound at present.

## 2016-04-03 ENCOUNTER — Other Ambulatory Visit: Payer: Self-pay | Admitting: Endocrinology

## 2016-04-03 DIAGNOSIS — E240 Pituitary-dependent Cushing's disease: Secondary | ICD-10-CM

## 2016-04-10 ENCOUNTER — Ambulatory Visit
Admission: RE | Admit: 2016-04-10 | Discharge: 2016-04-10 | Disposition: A | Discharge status: Home / Self Care | Payer: Commercial Managed Care - HMO | Source: Ambulatory Visit | Attending: Endocrinology | Admitting: Endocrinology

## 2016-04-10 DIAGNOSIS — E240 Pituitary-dependent Cushing's disease: Secondary | ICD-10-CM

## 2016-05-30 DIAGNOSIS — I10 Essential (primary) hypertension: Secondary | ICD-10-CM | POA: Diagnosis not present

## 2016-05-30 DIAGNOSIS — E118 Type 2 diabetes mellitus with unspecified complications: Secondary | ICD-10-CM | POA: Diagnosis not present

## 2016-06-03 DIAGNOSIS — E118 Type 2 diabetes mellitus with unspecified complications: Secondary | ICD-10-CM | POA: Diagnosis not present

## 2016-06-12 DIAGNOSIS — L4050 Arthropathic psoriasis, unspecified: Secondary | ICD-10-CM | POA: Diagnosis not present

## 2016-07-03 DIAGNOSIS — E118 Type 2 diabetes mellitus with unspecified complications: Secondary | ICD-10-CM | POA: Diagnosis not present

## 2016-07-10 DIAGNOSIS — E118 Type 2 diabetes mellitus with unspecified complications: Secondary | ICD-10-CM | POA: Diagnosis not present

## 2016-07-10 DIAGNOSIS — L4050 Arthropathic psoriasis, unspecified: Secondary | ICD-10-CM | POA: Diagnosis not present

## 2016-07-11 DIAGNOSIS — L4050 Arthropathic psoriasis, unspecified: Secondary | ICD-10-CM | POA: Diagnosis not present

## 2016-07-11 DIAGNOSIS — M150 Primary generalized (osteo)arthritis: Secondary | ICD-10-CM | POA: Diagnosis not present

## 2016-07-11 DIAGNOSIS — M79643 Pain in unspecified hand: Secondary | ICD-10-CM | POA: Diagnosis not present

## 2016-07-11 DIAGNOSIS — D696 Thrombocytopenia, unspecified: Secondary | ICD-10-CM | POA: Diagnosis not present

## 2016-07-11 DIAGNOSIS — Z13828 Encounter for screening for other musculoskeletal disorder: Secondary | ICD-10-CM | POA: Diagnosis not present

## 2016-07-11 DIAGNOSIS — M545 Low back pain: Secondary | ICD-10-CM | POA: Diagnosis not present

## 2016-07-17 DIAGNOSIS — E118 Type 2 diabetes mellitus with unspecified complications: Secondary | ICD-10-CM | POA: Diagnosis not present

## 2016-07-17 DIAGNOSIS — I10 Essential (primary) hypertension: Secondary | ICD-10-CM | POA: Diagnosis not present

## 2016-07-17 DIAGNOSIS — E249 Cushing's syndrome, unspecified: Secondary | ICD-10-CM | POA: Diagnosis not present

## 2016-08-07 DIAGNOSIS — L4050 Arthropathic psoriasis, unspecified: Secondary | ICD-10-CM | POA: Diagnosis not present

## 2016-09-04 DIAGNOSIS — L4050 Arthropathic psoriasis, unspecified: Secondary | ICD-10-CM | POA: Diagnosis not present

## 2016-10-13 DIAGNOSIS — M545 Low back pain: Secondary | ICD-10-CM | POA: Diagnosis not present

## 2016-10-13 DIAGNOSIS — L4050 Arthropathic psoriasis, unspecified: Secondary | ICD-10-CM | POA: Diagnosis not present

## 2016-10-13 DIAGNOSIS — D696 Thrombocytopenia, unspecified: Secondary | ICD-10-CM | POA: Diagnosis not present

## 2016-10-13 DIAGNOSIS — M150 Primary generalized (osteo)arthritis: Secondary | ICD-10-CM | POA: Diagnosis not present

## 2016-10-30 DIAGNOSIS — L4050 Arthropathic psoriasis, unspecified: Secondary | ICD-10-CM | POA: Diagnosis not present

## 2016-11-07 DIAGNOSIS — I10 Essential (primary) hypertension: Secondary | ICD-10-CM | POA: Diagnosis not present

## 2016-11-07 DIAGNOSIS — D3501 Benign neoplasm of right adrenal gland: Secondary | ICD-10-CM | POA: Diagnosis not present

## 2016-11-07 DIAGNOSIS — E119 Type 2 diabetes mellitus without complications: Secondary | ICD-10-CM | POA: Diagnosis not present

## 2016-11-07 DIAGNOSIS — M810 Age-related osteoporosis without current pathological fracture: Secondary | ICD-10-CM | POA: Diagnosis not present

## 2016-11-07 DIAGNOSIS — E118 Type 2 diabetes mellitus with unspecified complications: Secondary | ICD-10-CM | POA: Diagnosis not present

## 2016-12-22 DIAGNOSIS — Z7984 Long term (current) use of oral hypoglycemic drugs: Secondary | ICD-10-CM | POA: Diagnosis not present

## 2016-12-22 DIAGNOSIS — Z794 Long term (current) use of insulin: Secondary | ICD-10-CM | POA: Diagnosis not present

## 2016-12-22 DIAGNOSIS — H524 Presbyopia: Secondary | ICD-10-CM | POA: Diagnosis not present

## 2016-12-22 DIAGNOSIS — H2513 Age-related nuclear cataract, bilateral: Secondary | ICD-10-CM | POA: Diagnosis not present

## 2016-12-22 DIAGNOSIS — E119 Type 2 diabetes mellitus without complications: Secondary | ICD-10-CM | POA: Diagnosis not present

## 2016-12-29 DIAGNOSIS — L4050 Arthropathic psoriasis, unspecified: Secondary | ICD-10-CM | POA: Diagnosis not present

## 2017-01-13 DIAGNOSIS — Z79899 Other long term (current) drug therapy: Secondary | ICD-10-CM | POA: Diagnosis not present

## 2017-01-13 DIAGNOSIS — M810 Age-related osteoporosis without current pathological fracture: Secondary | ICD-10-CM | POA: Diagnosis not present

## 2017-01-13 DIAGNOSIS — M150 Primary generalized (osteo)arthritis: Secondary | ICD-10-CM | POA: Diagnosis not present

## 2017-01-13 DIAGNOSIS — D696 Thrombocytopenia, unspecified: Secondary | ICD-10-CM | POA: Diagnosis not present

## 2017-01-13 DIAGNOSIS — M25561 Pain in right knee: Secondary | ICD-10-CM | POA: Diagnosis not present

## 2017-01-13 DIAGNOSIS — L4050 Arthropathic psoriasis, unspecified: Secondary | ICD-10-CM | POA: Diagnosis not present

## 2017-01-13 DIAGNOSIS — M545 Low back pain: Secondary | ICD-10-CM | POA: Diagnosis not present

## 2017-01-29 DIAGNOSIS — J069 Acute upper respiratory infection, unspecified: Secondary | ICD-10-CM | POA: Diagnosis not present

## 2017-02-23 DIAGNOSIS — L4050 Arthropathic psoriasis, unspecified: Secondary | ICD-10-CM | POA: Diagnosis not present

## 2017-03-02 DIAGNOSIS — I10 Essential (primary) hypertension: Secondary | ICD-10-CM | POA: Diagnosis not present

## 2017-03-02 DIAGNOSIS — E1140 Type 2 diabetes mellitus with diabetic neuropathy, unspecified: Secondary | ICD-10-CM | POA: Diagnosis not present

## 2017-03-02 DIAGNOSIS — L4050 Arthropathic psoriasis, unspecified: Secondary | ICD-10-CM | POA: Diagnosis not present

## 2017-03-02 DIAGNOSIS — Z23 Encounter for immunization: Secondary | ICD-10-CM | POA: Diagnosis not present

## 2017-03-17 DIAGNOSIS — D3501 Benign neoplasm of right adrenal gland: Secondary | ICD-10-CM | POA: Diagnosis not present

## 2017-03-17 DIAGNOSIS — E1140 Type 2 diabetes mellitus with diabetic neuropathy, unspecified: Secondary | ICD-10-CM | POA: Diagnosis not present

## 2017-03-17 DIAGNOSIS — E1165 Type 2 diabetes mellitus with hyperglycemia: Secondary | ICD-10-CM | POA: Diagnosis not present

## 2017-03-17 DIAGNOSIS — I10 Essential (primary) hypertension: Secondary | ICD-10-CM | POA: Diagnosis not present

## 2017-03-26 DIAGNOSIS — E119 Type 2 diabetes mellitus without complications: Secondary | ICD-10-CM | POA: Diagnosis not present

## 2017-03-26 DIAGNOSIS — Z794 Long term (current) use of insulin: Secondary | ICD-10-CM | POA: Diagnosis not present

## 2017-03-26 DIAGNOSIS — R21 Rash and other nonspecific skin eruption: Secondary | ICD-10-CM | POA: Diagnosis not present

## 2017-03-26 DIAGNOSIS — Z862 Personal history of diseases of the blood and blood-forming organs and certain disorders involving the immune mechanism: Secondary | ICD-10-CM | POA: Diagnosis not present

## 2017-03-26 DIAGNOSIS — R319 Hematuria, unspecified: Secondary | ICD-10-CM | POA: Diagnosis not present

## 2017-04-03 DIAGNOSIS — E119 Type 2 diabetes mellitus without complications: Secondary | ICD-10-CM | POA: Diagnosis not present

## 2017-04-03 DIAGNOSIS — H2511 Age-related nuclear cataract, right eye: Secondary | ICD-10-CM | POA: Diagnosis not present

## 2017-04-03 DIAGNOSIS — H2512 Age-related nuclear cataract, left eye: Secondary | ICD-10-CM | POA: Diagnosis not present

## 2017-04-03 DIAGNOSIS — Z794 Long term (current) use of insulin: Secondary | ICD-10-CM | POA: Diagnosis not present

## 2017-04-03 DIAGNOSIS — H524 Presbyopia: Secondary | ICD-10-CM | POA: Diagnosis not present

## 2017-04-09 ENCOUNTER — Other Ambulatory Visit: Payer: Self-pay

## 2017-04-09 ENCOUNTER — Emergency Department (HOSPITAL_BASED_OUTPATIENT_CLINIC_OR_DEPARTMENT_OTHER)
Admission: EM | Admit: 2017-04-09 | Discharge: 2017-04-09 | Disposition: A | Discharge status: Home / Self Care | Payer: Medicare Other | Attending: Physician Assistant | Admitting: Physician Assistant

## 2017-04-09 ENCOUNTER — Encounter (HOSPITAL_BASED_OUTPATIENT_CLINIC_OR_DEPARTMENT_OTHER): Payer: Self-pay

## 2017-04-09 DIAGNOSIS — L959 Vasculitis limited to the skin, unspecified: Secondary | ICD-10-CM | POA: Diagnosis not present

## 2017-04-09 DIAGNOSIS — I776 Arteritis, unspecified: Secondary | ICD-10-CM

## 2017-04-09 DIAGNOSIS — R21 Rash and other nonspecific skin eruption: Secondary | ICD-10-CM | POA: Diagnosis present

## 2017-04-09 LAB — CBC WITH DIFFERENTIAL/PLATELET
Basophils Absolute: 0 10*3/uL (ref 0.0–0.1)
Basophils Relative: 0 %
Eosinophils Absolute: 0.2 10*3/uL (ref 0.0–0.7)
Eosinophils Relative: 4 %
HEMATOCRIT: 39.3 % (ref 36.0–46.0)
Hemoglobin: 13.2 g/dL (ref 12.0–15.0)
LYMPHS PCT: 33 %
Lymphs Abs: 1.9 10*3/uL (ref 0.7–4.0)
MCH: 28.6 pg (ref 26.0–34.0)
MCHC: 33.6 g/dL (ref 30.0–36.0)
MCV: 85.2 fL (ref 78.0–100.0)
MONO ABS: 0.6 10*3/uL (ref 0.1–1.0)
MONOS PCT: 11 %
NEUTROS ABS: 3 10*3/uL (ref 1.7–7.7)
Neutrophils Relative %: 52 %
Platelets: 141 10*3/uL — ABNORMAL LOW (ref 150–400)
RBC: 4.61 MIL/uL (ref 3.87–5.11)
RDW: 13.8 % (ref 11.5–15.5)
WBC: 5.7 10*3/uL (ref 4.0–10.5)

## 2017-04-09 LAB — URINALYSIS, MICROSCOPIC (REFLEX)

## 2017-04-09 LAB — URINALYSIS, ROUTINE W REFLEX MICROSCOPIC
Bilirubin Urine: NEGATIVE
Glucose, UA: >=500 mg/dL — AB
Ketones, ur: NEGATIVE mg/dL
Nitrite: NEGATIVE
Protein, ur: NEGATIVE mg/dL
Specific Gravity, Urine: 1.01 (ref 1.005–1.030)
pH: 6.5 (ref 5.0–8.0)

## 2017-04-09 LAB — COMPREHENSIVE METABOLIC PANEL
ALBUMIN: 3.3 g/dL — AB (ref 3.5–5.0)
ALK PHOS: 95 U/L (ref 38–126)
ALT: 11 U/L — ABNORMAL LOW (ref 14–54)
ANION GAP: 9 (ref 5–15)
AST: 16 U/L (ref 15–41)
BUN: 8 mg/dL (ref 6–20)
CO2: 26 mmol/L (ref 22–32)
Calcium: 8.9 mg/dL (ref 8.9–10.3)
Chloride: 105 mmol/L (ref 101–111)
Creatinine, Ser: 0.59 mg/dL (ref 0.44–1.00)
GFR calc Af Amer: >60 mL/min (ref >60)
GFR calc non Af Amer: >60 mL/min (ref >60)
GLUCOSE: 181 mg/dL — AB (ref 65–99)
POTASSIUM: 3.3 mmol/L — AB (ref 3.5–5.1)
SODIUM: 140 mmol/L (ref 135–145)
Total Bilirubin: 0.5 mg/dL (ref 0.3–1.2)
Total Protein: 7.2 g/dL (ref 6.5–8.1)

## 2017-04-09 LAB — PROTIME-INR
INR: 0.87
Prothrombin Time: 11.7 seconds (ref 11.4–15.2)

## 2017-04-09 NOTE — ED Triage Notes (Signed)
C/o rash to bilat LE x 2 weeks-was seen at UC "small cell vasculitis"-was advised to go to dermatologist-unable to get an appt-NAD-steady gait

## 2017-04-09 NOTE — Discharge Instructions (Signed)
We need you to follow-up with your primary care tomorrow morning.  In addition we will you to make an appointment and follow-up with your rheumatologist.  Please tell them that you are experiencing vasculitis given the fact that you are on  immunologic.  We have given you additional name of a dermatologist in the area.

## 2017-04-09 NOTE — ED Provider Notes (Signed)
[facility] EMERGENCY DEPARTMENT Provider Note   CSN: [id number] Arrival date & time: 04/09/17  1237     History   Chief Complaint Chief Complaint  Patient presents with  . Rash    HPI Shannon Craig is a 67 y.o. female.  HPI   Patient is a 67 year old female presenting with bilateral rash on external.  Was seen in urgent care and diagnosed with small cell vasculitis.  Patient's been unable to get in touch with any kind of dermatologist.  She reports that her normal dermatologist can see her till May.  She reports that she called several others were also not see her for very long time.  Patient has absolutely no symptoms.  No chest pain cough trouble sleeping, weight loss fevers.  Patient had no new medications.  We went extensively over her medication list and she is had no new medications for the last 2 weeks.  Has not had any recent travel, no other changes that she can remember.  Patient is on a monthly injectable biologic for her psoriatic arthritis.  Had at last 3 weeks ago..  Past Medical History:  Diagnosis Date  . Diabetes mellitus ([place])   . Diverticula of colon   . External hemorrhoid   . Hypertension   . Internal hemorrhoid   . Kidney stones   . Liver nodule   . Nodule of kidney     There are no active problems to display for this patient.   Past Surgical History:  Procedure Laterality Date  . ABDOMINAL HYSTERECTOMY  1991  . BREAST REDUCTION SURGERY  1985  . HAND SURGERY  2001   left, pins  . LASIK  2005   bilateral  . LITHOTRIPSY  1983    OB History    No data available       Home Medications    Prior to Admission medications   Medication Sig Start Date End Date Taking? Authorizing Provider  Albiglutide (TANZEUM) 50 MG PEN Inject into the skin.    [provider]  Alogliptin Benzoate (NESINA) 25 MG TABS Take by mouth.    [provider]  atorvastatin (LIPITOR) 40 MG tablet Take 40 mg by mouth daily.    [provider]  Calcium-Vitamin D-Vitamin K (VIACTIV PO) Take 1 tablet by mouth daily.    [provider]  Canagliflozin (INVOKANA) 300 MG TABS Take 300 mg by mouth daily. 09/25/11   Milus Banister, MD  empagliflozin (JARDIANCE) 25 MG TABS tablet Take 25 mg by mouth daily.    [provider]  InFLIXimab (REMICADE IV) Inject into the vein.    [provider]  meloxicam (MOBIC) 15 MG tablet  08/27/12   [provider]  Multiple Vitamin (MULTIVITAMIN) tablet Take 1 tablet by mouth daily.    [provider]  pioglitazone (ACTOS) 15 MG tablet Take 15 mg by mouth daily.    [provider]  ramipril (ALTACE) 5 MG capsule Take 5 mg by mouth daily.    [provider]  traMADol (ULTRAM) 50 MG tablet Take 1 tablet (50 mg total) by mouth every 6 (six) hours as needed. 07/21/15   Orlie Dakin, MD    Family History Family History  Problem Relation Age of Onset  . Breast cancer Mother   . Diabetes Father   . Prostate cancer Maternal Uncle   . Stroke Maternal Grandmother   . Heart attack Maternal Grandfather   . Diabetes Brother  Social History Social History   Tobacco Use  . Smoking status: Never Smoker  . Smokeless tobacco: Never Used  Substance Use Topics  . Alcohol use: No  . Drug use: No     Allergies   Darvocet [propoxyphene n-acetaminophen]; Gadolinium derivatives; Methotrexate derivatives; and Percodan [oxycodone-aspirin]   Review of Systems Review of Systems  Constitutional: Negative for activity change and fatigue.  HENT: Negative for congestion and drooling.   Eyes: Negative for discharge.  Respiratory: Negative for cough and chest tightness.   Cardiovascular: Negative for chest pain.  Gastrointestinal: Negative for abdominal distention.  Genitourinary: Negative for difficulty urinating and dysuria.  Musculoskeletal: Negative for joint swelling.  Skin: Positive for rash.  Allergic/Immunologic: Negative for  immunocompromised state.  Neurological: Negative for seizures and speech difficulty.  Psychiatric/Behavioral: Negative for agitation and behavioral problems.  All other systems reviewed and are negative.    Physical Exam Updated Vital Signs BP (!) 143/75 (BP Location: Right Arm)   Pulse 71   Temp 98.1 F (36.7 C) (Oral)   Resp 20   Ht 4\' 9"  (1.448 m)   Wt 63 kg (138 lb 14.2 oz)   SpO2 100%   BMI 30.06 kg/m   Physical Exam  Constitutional: She is oriented to person, place, and time. She appears well-developed and well-nourished.  HENT:  Head: Normocephalic and atraumatic.  Eyes: Right eye exhibits no discharge.  Cardiovascular: Normal rate.  Pulmonary/Chest: Effort normal.  Neurological: She is oriented to person, place, and time.  Skin: Skin is warm and dry. She is not diaphoretic.  Rash consistent with non-blanchable purpura.  See photo.  Psychiatric: She has a normal mood and affect.  Nursing note and vitals reviewed.      ED Treatments / Results  Labs (all labs ordered are listed, but only abnormal results are displayed) Labs Reviewed  CBC WITH DIFFERENTIAL/PLATELET - Abnormal; Notable for the following components:      Result Value   Platelets 141 (*)    All other components within normal limits  COMPREHENSIVE METABOLIC PANEL  PROTIME-INR  URINALYSIS, ROUTINE W REFLEX MICROSCOPIC    EKG  EKG Interpretation None       Radiology No results found.  Procedures Procedures (including critical care time)  Medications Ordered in ED Medications - No data to display   Initial Impression / Assessment and Plan / ED Course  I have reviewed the triage vital signs and the nursing notes.  Pertinent labs & imaging results that were available during my care of the patient were reviewed by me and considered in my medical decision making (see chart for details).     Patient is a 67 year old female presenting with bilateral rash on external.  Was seen in  urgent care and diagnosed with small cell vasculitis.  Patient's been unable to get in touch with any kind of dermatologist.  She reports that her normal dermatologist can see her till May.  She reports that she called several others were also not see her for very long time.  Patient has absolutely no symptoms.  No chest pain cough trouble sleeping, weight loss fevers.  Patient had no new medications.  We went extensively over her medication list and she is had no new medications for the last 2 weeks.  Has not had any recent travel, no other changes that she can remember.  Patient is on a monthly injectable biologic for her psoriatic arthritis.  5:04 PM With no symptoms, normal vital signs this is reassuring.  I doubt things such as HSP given her age.  Doubt dangerous pathology given her normal vital signs, and well appearance.  And also the time course of it going on for 2 weeks.  We will get labs to make sure the patient's platelets are normal and she has no white count.  Will recommend that she keeps the dermatology appointment and then follows up with rheumatology who is giving her the injectable biologic.  Final Clinical Impressions(s) / ED Diagnoses   Final diagnoses:  None    ED Discharge Orders    None       Macarthur Critchley, MD 04/09/17 2320

## 2017-04-13 DIAGNOSIS — E1165 Type 2 diabetes mellitus with hyperglycemia: Secondary | ICD-10-CM | POA: Diagnosis not present

## 2017-04-13 DIAGNOSIS — E1140 Type 2 diabetes mellitus with diabetic neuropathy, unspecified: Secondary | ICD-10-CM | POA: Diagnosis not present

## 2017-04-13 DIAGNOSIS — R21 Rash and other nonspecific skin eruption: Secondary | ICD-10-CM | POA: Diagnosis not present

## 2017-04-13 DIAGNOSIS — I10 Essential (primary) hypertension: Secondary | ICD-10-CM | POA: Diagnosis not present

## 2017-04-15 DIAGNOSIS — I10 Essential (primary) hypertension: Secondary | ICD-10-CM | POA: Diagnosis not present

## 2017-04-15 DIAGNOSIS — E118 Type 2 diabetes mellitus with unspecified complications: Secondary | ICD-10-CM | POA: Diagnosis not present

## 2017-04-15 DIAGNOSIS — R21 Rash and other nonspecific skin eruption: Secondary | ICD-10-CM | POA: Diagnosis not present

## 2017-04-16 DIAGNOSIS — M310 Hypersensitivity angiitis: Secondary | ICD-10-CM | POA: Diagnosis not present

## 2017-04-16 DIAGNOSIS — L958 Other vasculitis limited to the skin: Secondary | ICD-10-CM | POA: Diagnosis not present

## 2017-04-22 DIAGNOSIS — H2511 Age-related nuclear cataract, right eye: Secondary | ICD-10-CM | POA: Diagnosis not present

## 2017-04-22 DIAGNOSIS — H2512 Age-related nuclear cataract, left eye: Secondary | ICD-10-CM | POA: Diagnosis not present

## 2017-04-23 DIAGNOSIS — M810 Age-related osteoporosis without current pathological fracture: Secondary | ICD-10-CM | POA: Diagnosis not present

## 2017-04-23 DIAGNOSIS — I10 Essential (primary) hypertension: Secondary | ICD-10-CM | POA: Diagnosis not present

## 2017-04-23 DIAGNOSIS — E1140 Type 2 diabetes mellitus with diabetic neuropathy, unspecified: Secondary | ICD-10-CM | POA: Diagnosis not present

## 2017-04-23 DIAGNOSIS — R21 Rash and other nonspecific skin eruption: Secondary | ICD-10-CM | POA: Diagnosis not present

## 2017-04-23 DIAGNOSIS — E1165 Type 2 diabetes mellitus with hyperglycemia: Secondary | ICD-10-CM | POA: Diagnosis not present

## 2017-04-29 DIAGNOSIS — H2512 Age-related nuclear cataract, left eye: Secondary | ICD-10-CM | POA: Diagnosis not present

## 2017-04-30 DIAGNOSIS — L958 Other vasculitis limited to the skin: Secondary | ICD-10-CM | POA: Diagnosis not present

## 2017-05-04 DIAGNOSIS — M310 Hypersensitivity angiitis: Secondary | ICD-10-CM | POA: Diagnosis not present

## 2017-05-04 DIAGNOSIS — D696 Thrombocytopenia, unspecified: Secondary | ICD-10-CM | POA: Diagnosis not present

## 2017-05-04 DIAGNOSIS — M810 Age-related osteoporosis without current pathological fracture: Secondary | ICD-10-CM | POA: Diagnosis not present

## 2017-05-04 DIAGNOSIS — L4050 Arthropathic psoriasis, unspecified: Secondary | ICD-10-CM | POA: Diagnosis not present

## 2017-05-04 DIAGNOSIS — M150 Primary generalized (osteo)arthritis: Secondary | ICD-10-CM | POA: Diagnosis not present

## 2017-05-04 DIAGNOSIS — Z79899 Other long term (current) drug therapy: Secondary | ICD-10-CM | POA: Diagnosis not present

## 2017-05-27 DIAGNOSIS — M150 Primary generalized (osteo)arthritis: Secondary | ICD-10-CM | POA: Diagnosis not present

## 2017-05-27 DIAGNOSIS — L4050 Arthropathic psoriasis, unspecified: Secondary | ICD-10-CM | POA: Diagnosis not present

## 2017-05-27 DIAGNOSIS — M810 Age-related osteoporosis without current pathological fracture: Secondary | ICD-10-CM | POA: Diagnosis not present

## 2017-05-27 DIAGNOSIS — E559 Vitamin D deficiency, unspecified: Secondary | ICD-10-CM | POA: Diagnosis not present

## 2017-05-27 DIAGNOSIS — D696 Thrombocytopenia, unspecified: Secondary | ICD-10-CM | POA: Diagnosis not present

## 2017-05-27 DIAGNOSIS — M310 Hypersensitivity angiitis: Secondary | ICD-10-CM | POA: Diagnosis not present

## 2017-05-27 DIAGNOSIS — M25561 Pain in right knee: Secondary | ICD-10-CM | POA: Diagnosis not present

## 2017-05-27 DIAGNOSIS — Z79899 Other long term (current) drug therapy: Secondary | ICD-10-CM | POA: Diagnosis not present

## 2017-06-22 DIAGNOSIS — L4050 Arthropathic psoriasis, unspecified: Secondary | ICD-10-CM | POA: Diagnosis not present

## 2017-07-16 DIAGNOSIS — L4050 Arthropathic psoriasis, unspecified: Secondary | ICD-10-CM | POA: Diagnosis not present

## 2017-07-30 DIAGNOSIS — J309 Allergic rhinitis, unspecified: Secondary | ICD-10-CM | POA: Diagnosis not present

## 2017-07-30 DIAGNOSIS — R3915 Urgency of urination: Secondary | ICD-10-CM | POA: Diagnosis not present

## 2017-07-30 DIAGNOSIS — R300 Dysuria: Secondary | ICD-10-CM | POA: Diagnosis not present

## 2017-08-03 DIAGNOSIS — E1140 Type 2 diabetes mellitus with diabetic neuropathy, unspecified: Secondary | ICD-10-CM | POA: Diagnosis not present

## 2017-08-03 DIAGNOSIS — Z23 Encounter for immunization: Secondary | ICD-10-CM | POA: Diagnosis not present

## 2017-08-03 DIAGNOSIS — Z0000 Encounter for general adult medical examination without abnormal findings: Secondary | ICD-10-CM | POA: Diagnosis not present

## 2017-08-03 DIAGNOSIS — L4050 Arthropathic psoriasis, unspecified: Secondary | ICD-10-CM | POA: Diagnosis not present

## 2017-08-03 DIAGNOSIS — E559 Vitamin D deficiency, unspecified: Secondary | ICD-10-CM | POA: Diagnosis not present

## 2017-08-03 DIAGNOSIS — I10 Essential (primary) hypertension: Secondary | ICD-10-CM | POA: Diagnosis not present

## 2017-08-03 DIAGNOSIS — E118 Type 2 diabetes mellitus with unspecified complications: Secondary | ICD-10-CM | POA: Diagnosis not present

## 2017-08-05 DIAGNOSIS — L4050 Arthropathic psoriasis, unspecified: Secondary | ICD-10-CM | POA: Diagnosis not present

## 2017-08-05 DIAGNOSIS — M810 Age-related osteoporosis without current pathological fracture: Secondary | ICD-10-CM | POA: Diagnosis not present

## 2017-08-05 DIAGNOSIS — D696 Thrombocytopenia, unspecified: Secondary | ICD-10-CM | POA: Diagnosis not present

## 2017-08-05 DIAGNOSIS — M25561 Pain in right knee: Secondary | ICD-10-CM | POA: Diagnosis not present

## 2017-08-05 DIAGNOSIS — E559 Vitamin D deficiency, unspecified: Secondary | ICD-10-CM | POA: Diagnosis not present

## 2017-08-05 DIAGNOSIS — Z79899 Other long term (current) drug therapy: Secondary | ICD-10-CM | POA: Diagnosis not present

## 2017-08-05 DIAGNOSIS — M310 Hypersensitivity angiitis: Secondary | ICD-10-CM | POA: Diagnosis not present

## 2017-08-05 DIAGNOSIS — M150 Primary generalized (osteo)arthritis: Secondary | ICD-10-CM | POA: Diagnosis not present

## 2017-08-10 DIAGNOSIS — L4050 Arthropathic psoriasis, unspecified: Secondary | ICD-10-CM | POA: Diagnosis not present

## 2017-08-10 DIAGNOSIS — E1165 Type 2 diabetes mellitus with hyperglycemia: Secondary | ICD-10-CM | POA: Diagnosis not present

## 2017-08-10 DIAGNOSIS — N390 Urinary tract infection, site not specified: Secondary | ICD-10-CM | POA: Diagnosis not present

## 2017-08-10 DIAGNOSIS — R319 Hematuria, unspecified: Secondary | ICD-10-CM | POA: Diagnosis not present

## 2017-08-10 DIAGNOSIS — E782 Mixed hyperlipidemia: Secondary | ICD-10-CM | POA: Diagnosis not present

## 2017-08-10 DIAGNOSIS — E1140 Type 2 diabetes mellitus with diabetic neuropathy, unspecified: Secondary | ICD-10-CM | POA: Diagnosis not present

## 2017-08-10 DIAGNOSIS — I10 Essential (primary) hypertension: Secondary | ICD-10-CM | POA: Diagnosis not present

## 2017-08-10 DIAGNOSIS — M810 Age-related osteoporosis without current pathological fracture: Secondary | ICD-10-CM | POA: Diagnosis not present

## 2017-08-10 DIAGNOSIS — D3501 Benign neoplasm of right adrenal gland: Secondary | ICD-10-CM | POA: Diagnosis not present

## 2017-10-02 DIAGNOSIS — Z961 Presence of intraocular lens: Secondary | ICD-10-CM | POA: Diagnosis not present

## 2017-10-15 DIAGNOSIS — E1140 Type 2 diabetes mellitus with diabetic neuropathy, unspecified: Secondary | ICD-10-CM | POA: Diagnosis not present

## 2017-10-15 DIAGNOSIS — M810 Age-related osteoporosis without current pathological fracture: Secondary | ICD-10-CM | POA: Diagnosis not present

## 2017-10-15 DIAGNOSIS — I10 Essential (primary) hypertension: Secondary | ICD-10-CM | POA: Diagnosis not present

## 2017-10-15 DIAGNOSIS — E1165 Type 2 diabetes mellitus with hyperglycemia: Secondary | ICD-10-CM | POA: Diagnosis not present

## 2017-11-06 DIAGNOSIS — M25561 Pain in right knee: Secondary | ICD-10-CM | POA: Diagnosis not present

## 2017-11-06 DIAGNOSIS — M310 Hypersensitivity angiitis: Secondary | ICD-10-CM | POA: Diagnosis not present

## 2017-11-06 DIAGNOSIS — M150 Primary generalized (osteo)arthritis: Secondary | ICD-10-CM | POA: Diagnosis not present

## 2017-11-06 DIAGNOSIS — L4050 Arthropathic psoriasis, unspecified: Secondary | ICD-10-CM | POA: Diagnosis not present

## 2017-11-06 DIAGNOSIS — D696 Thrombocytopenia, unspecified: Secondary | ICD-10-CM | POA: Diagnosis not present

## 2017-11-06 DIAGNOSIS — E559 Vitamin D deficiency, unspecified: Secondary | ICD-10-CM | POA: Diagnosis not present

## 2017-11-06 DIAGNOSIS — M810 Age-related osteoporosis without current pathological fracture: Secondary | ICD-10-CM | POA: Diagnosis not present

## 2017-11-12 DIAGNOSIS — L4050 Arthropathic psoriasis, unspecified: Secondary | ICD-10-CM | POA: Diagnosis not present

## 2017-12-07 DIAGNOSIS — R319 Hematuria, unspecified: Secondary | ICD-10-CM | POA: Diagnosis not present

## 2017-12-07 DIAGNOSIS — R300 Dysuria: Secondary | ICD-10-CM | POA: Diagnosis not present

## 2017-12-07 DIAGNOSIS — I10 Essential (primary) hypertension: Secondary | ICD-10-CM | POA: Diagnosis not present

## 2017-12-07 DIAGNOSIS — E1165 Type 2 diabetes mellitus with hyperglycemia: Secondary | ICD-10-CM | POA: Diagnosis not present

## 2017-12-14 DIAGNOSIS — L4050 Arthropathic psoriasis, unspecified: Secondary | ICD-10-CM | POA: Diagnosis not present

## 2017-12-28 DIAGNOSIS — E782 Mixed hyperlipidemia: Secondary | ICD-10-CM | POA: Diagnosis not present

## 2017-12-28 DIAGNOSIS — E1140 Type 2 diabetes mellitus with diabetic neuropathy, unspecified: Secondary | ICD-10-CM | POA: Diagnosis not present

## 2017-12-28 DIAGNOSIS — I10 Essential (primary) hypertension: Secondary | ICD-10-CM | POA: Diagnosis not present

## 2017-12-28 DIAGNOSIS — M810 Age-related osteoporosis without current pathological fracture: Secondary | ICD-10-CM | POA: Diagnosis not present

## 2017-12-28 DIAGNOSIS — N390 Urinary tract infection, site not specified: Secondary | ICD-10-CM | POA: Diagnosis not present

## 2017-12-28 DIAGNOSIS — L4050 Arthropathic psoriasis, unspecified: Secondary | ICD-10-CM | POA: Diagnosis not present

## 2018-01-04 DIAGNOSIS — M25552 Pain in left hip: Secondary | ICD-10-CM | POA: Diagnosis not present

## 2018-01-04 DIAGNOSIS — Z23 Encounter for immunization: Secondary | ICD-10-CM | POA: Diagnosis not present

## 2018-01-04 DIAGNOSIS — S79912A Unspecified injury of left hip, initial encounter: Secondary | ICD-10-CM | POA: Diagnosis not present

## 2018-01-04 DIAGNOSIS — I10 Essential (primary) hypertension: Secondary | ICD-10-CM | POA: Diagnosis not present

## 2018-01-04 DIAGNOSIS — E1165 Type 2 diabetes mellitus with hyperglycemia: Secondary | ICD-10-CM | POA: Diagnosis not present

## 2018-01-04 DIAGNOSIS — E782 Mixed hyperlipidemia: Secondary | ICD-10-CM | POA: Diagnosis not present

## 2018-01-06 DIAGNOSIS — M310 Hypersensitivity angiitis: Secondary | ICD-10-CM | POA: Diagnosis not present

## 2018-01-06 DIAGNOSIS — M150 Primary generalized (osteo)arthritis: Secondary | ICD-10-CM | POA: Diagnosis not present

## 2018-01-06 DIAGNOSIS — M810 Age-related osteoporosis without current pathological fracture: Secondary | ICD-10-CM | POA: Diagnosis not present

## 2018-01-06 DIAGNOSIS — L4050 Arthropathic psoriasis, unspecified: Secondary | ICD-10-CM | POA: Diagnosis not present

## 2018-01-06 DIAGNOSIS — D696 Thrombocytopenia, unspecified: Secondary | ICD-10-CM | POA: Diagnosis not present

## 2018-01-06 DIAGNOSIS — M25561 Pain in right knee: Secondary | ICD-10-CM | POA: Diagnosis not present

## 2018-01-06 DIAGNOSIS — E559 Vitamin D deficiency, unspecified: Secondary | ICD-10-CM | POA: Diagnosis not present

## 2018-01-06 DIAGNOSIS — M7072 Other bursitis of hip, left hip: Secondary | ICD-10-CM | POA: Diagnosis not present

## 2018-01-12 DIAGNOSIS — L4050 Arthropathic psoriasis, unspecified: Secondary | ICD-10-CM | POA: Diagnosis not present

## 2018-02-09 DIAGNOSIS — L4050 Arthropathic psoriasis, unspecified: Secondary | ICD-10-CM | POA: Diagnosis not present

## 2018-02-10 DIAGNOSIS — M25561 Pain in right knee: Secondary | ICD-10-CM | POA: Diagnosis not present

## 2018-02-10 DIAGNOSIS — L4050 Arthropathic psoriasis, unspecified: Secondary | ICD-10-CM | POA: Diagnosis not present

## 2018-02-10 DIAGNOSIS — M150 Primary generalized (osteo)arthritis: Secondary | ICD-10-CM | POA: Diagnosis not present

## 2018-02-10 DIAGNOSIS — R319 Hematuria, unspecified: Secondary | ICD-10-CM | POA: Diagnosis not present

## 2018-02-10 DIAGNOSIS — D696 Thrombocytopenia, unspecified: Secondary | ICD-10-CM | POA: Diagnosis not present

## 2018-02-10 DIAGNOSIS — E559 Vitamin D deficiency, unspecified: Secondary | ICD-10-CM | POA: Diagnosis not present

## 2018-02-10 DIAGNOSIS — M810 Age-related osteoporosis without current pathological fracture: Secondary | ICD-10-CM | POA: Diagnosis not present

## 2018-02-10 DIAGNOSIS — M310 Hypersensitivity angiitis: Secondary | ICD-10-CM | POA: Diagnosis not present

## 2018-03-11 DIAGNOSIS — L4050 Arthropathic psoriasis, unspecified: Secondary | ICD-10-CM | POA: Diagnosis not present

## 2018-03-15 DIAGNOSIS — E1165 Type 2 diabetes mellitus with hyperglycemia: Secondary | ICD-10-CM | POA: Diagnosis not present

## 2018-03-15 DIAGNOSIS — E782 Mixed hyperlipidemia: Secondary | ICD-10-CM | POA: Diagnosis not present

## 2018-03-15 DIAGNOSIS — M25552 Pain in left hip: Secondary | ICD-10-CM | POA: Diagnosis not present

## 2018-03-22 DIAGNOSIS — E1165 Type 2 diabetes mellitus with hyperglycemia: Secondary | ICD-10-CM | POA: Diagnosis not present

## 2018-03-22 DIAGNOSIS — J0100 Acute maxillary sinusitis, unspecified: Secondary | ICD-10-CM | POA: Diagnosis not present

## 2018-03-22 DIAGNOSIS — I10 Essential (primary) hypertension: Secondary | ICD-10-CM | POA: Diagnosis not present

## 2018-03-22 DIAGNOSIS — E782 Mixed hyperlipidemia: Secondary | ICD-10-CM | POA: Diagnosis not present

## 2018-03-22 DIAGNOSIS — R51 Headache: Secondary | ICD-10-CM | POA: Diagnosis not present

## 2018-03-25 DIAGNOSIS — R310 Gross hematuria: Secondary | ICD-10-CM | POA: Diagnosis not present

## 2018-03-25 DIAGNOSIS — R3121 Asymptomatic microscopic hematuria: Secondary | ICD-10-CM | POA: Diagnosis not present

## 2018-03-25 DIAGNOSIS — N393 Stress incontinence (female) (male): Secondary | ICD-10-CM | POA: Diagnosis not present

## 2018-04-08 DIAGNOSIS — L4050 Arthropathic psoriasis, unspecified: Secondary | ICD-10-CM | POA: Diagnosis not present

## 2018-04-08 DIAGNOSIS — N200 Calculus of kidney: Secondary | ICD-10-CM | POA: Diagnosis not present

## 2018-04-08 DIAGNOSIS — R3121 Asymptomatic microscopic hematuria: Secondary | ICD-10-CM | POA: Diagnosis not present

## 2018-04-20 DIAGNOSIS — D49512 Neoplasm of unspecified behavior of left kidney: Secondary | ICD-10-CM | POA: Diagnosis not present

## 2018-04-20 DIAGNOSIS — R3121 Asymptomatic microscopic hematuria: Secondary | ICD-10-CM | POA: Diagnosis not present

## 2018-04-21 ENCOUNTER — Other Ambulatory Visit: Payer: Self-pay | Admitting: Urology

## 2018-04-21 DIAGNOSIS — D49512 Neoplasm of unspecified behavior of left kidney: Secondary | ICD-10-CM

## 2018-04-29 ENCOUNTER — Telehealth: Payer: Self-pay | Admitting: Nurse Practitioner

## 2018-04-29 NOTE — Telephone Encounter (Signed)
Phone call to patient to review instructions for 13 hr prep for a MRI w/ contrast on May 18th at 0950. Prescription called into [facility]. Pt aware and verbalized understanding of instructions. Prescription: 2050- 50mg  Prednisone 0250- 50mg  Prednisone 0850- 50mg  Prednisone and 50mg  Benadryl

## 2018-05-06 DIAGNOSIS — L4050 Arthropathic psoriasis, unspecified: Secondary | ICD-10-CM | POA: Diagnosis not present

## 2018-06-03 DIAGNOSIS — L4050 Arthropathic psoriasis, unspecified: Secondary | ICD-10-CM | POA: Diagnosis not present

## 2018-06-08 DIAGNOSIS — E1165 Type 2 diabetes mellitus with hyperglycemia: Secondary | ICD-10-CM | POA: Diagnosis not present

## 2018-06-15 DIAGNOSIS — I10 Essential (primary) hypertension: Secondary | ICD-10-CM | POA: Diagnosis not present

## 2018-06-15 DIAGNOSIS — E1165 Type 2 diabetes mellitus with hyperglycemia: Secondary | ICD-10-CM | POA: Diagnosis not present

## 2018-06-15 DIAGNOSIS — Z9641 Presence of insulin pump (external) (internal): Secondary | ICD-10-CM | POA: Diagnosis not present

## 2018-06-15 DIAGNOSIS — E782 Mixed hyperlipidemia: Secondary | ICD-10-CM | POA: Diagnosis not present

## 2018-06-15 DIAGNOSIS — E1140 Type 2 diabetes mellitus with diabetic neuropathy, unspecified: Secondary | ICD-10-CM | POA: Diagnosis not present

## 2018-06-16 DIAGNOSIS — E559 Vitamin D deficiency, unspecified: Secondary | ICD-10-CM | POA: Diagnosis not present

## 2018-06-16 DIAGNOSIS — L4050 Arthropathic psoriasis, unspecified: Secondary | ICD-10-CM | POA: Diagnosis not present

## 2018-06-16 DIAGNOSIS — Z79899 Other long term (current) drug therapy: Secondary | ICD-10-CM | POA: Diagnosis not present

## 2018-06-16 DIAGNOSIS — M310 Hypersensitivity angiitis: Secondary | ICD-10-CM | POA: Diagnosis not present

## 2018-06-16 DIAGNOSIS — E1165 Type 2 diabetes mellitus with hyperglycemia: Secondary | ICD-10-CM | POA: Diagnosis not present

## 2018-06-16 DIAGNOSIS — M79643 Pain in unspecified hand: Secondary | ICD-10-CM | POA: Diagnosis not present

## 2018-06-16 DIAGNOSIS — M150 Primary generalized (osteo)arthritis: Secondary | ICD-10-CM | POA: Diagnosis not present

## 2018-06-16 DIAGNOSIS — R319 Hematuria, unspecified: Secondary | ICD-10-CM | POA: Diagnosis not present

## 2018-06-16 DIAGNOSIS — M810 Age-related osteoporosis without current pathological fracture: Secondary | ICD-10-CM | POA: Diagnosis not present

## 2018-06-16 DIAGNOSIS — D696 Thrombocytopenia, unspecified: Secondary | ICD-10-CM | POA: Diagnosis not present

## 2018-07-12 ENCOUNTER — Ambulatory Visit
Admission: RE | Admit: 2018-07-12 | Discharge: 2018-07-12 | Disposition: A | Discharge status: Home / Self Care | Payer: Medicare Other | Source: Ambulatory Visit | Attending: Urology | Admitting: Urology

## 2018-07-12 ENCOUNTER — Other Ambulatory Visit: Payer: Self-pay

## 2018-07-12 DIAGNOSIS — D49512 Neoplasm of unspecified behavior of left kidney: Secondary | ICD-10-CM

## 2018-07-12 MED ORDER — GADOBENATE DIMEGLUMINE 529 MG/ML IV SOLN
14.0000 mL | Freq: Once | INTRAVENOUS | Status: AC | PRN
  Administered 2018-07-12: 14 mL via INTRAVENOUS

## 2018-07-15 DIAGNOSIS — N281 Cyst of kidney, acquired: Secondary | ICD-10-CM | POA: Diagnosis not present

## 2018-07-15 DIAGNOSIS — R3121 Asymptomatic microscopic hematuria: Secondary | ICD-10-CM | POA: Diagnosis not present

## 2018-08-05 DIAGNOSIS — Z0000 Encounter for general adult medical examination without abnormal findings: Secondary | ICD-10-CM | POA: Diagnosis not present

## 2018-08-05 DIAGNOSIS — I700 Atherosclerosis of aorta: Secondary | ICD-10-CM | POA: Diagnosis not present

## 2018-08-05 DIAGNOSIS — E1165 Type 2 diabetes mellitus with hyperglycemia: Secondary | ICD-10-CM | POA: Diagnosis not present

## 2018-08-05 DIAGNOSIS — Z7189 Other specified counseling: Secondary | ICD-10-CM | POA: Diagnosis not present

## 2018-08-05 DIAGNOSIS — I10 Essential (primary) hypertension: Secondary | ICD-10-CM | POA: Diagnosis not present

## 2018-08-05 DIAGNOSIS — M810 Age-related osteoporosis without current pathological fracture: Secondary | ICD-10-CM | POA: Diagnosis not present

## 2018-08-05 DIAGNOSIS — E782 Mixed hyperlipidemia: Secondary | ICD-10-CM | POA: Diagnosis not present

## 2018-08-05 DIAGNOSIS — N390 Urinary tract infection, site not specified: Secondary | ICD-10-CM | POA: Diagnosis not present

## 2018-08-12 DIAGNOSIS — E1140 Type 2 diabetes mellitus with diabetic neuropathy, unspecified: Secondary | ICD-10-CM | POA: Diagnosis not present

## 2018-08-12 DIAGNOSIS — L4050 Arthropathic psoriasis, unspecified: Secondary | ICD-10-CM | POA: Diagnosis not present

## 2018-08-12 DIAGNOSIS — M810 Age-related osteoporosis without current pathological fracture: Secondary | ICD-10-CM | POA: Diagnosis not present

## 2018-08-12 DIAGNOSIS — E782 Mixed hyperlipidemia: Secondary | ICD-10-CM | POA: Diagnosis not present

## 2018-08-12 DIAGNOSIS — E1165 Type 2 diabetes mellitus with hyperglycemia: Secondary | ICD-10-CM | POA: Diagnosis not present

## 2018-08-12 DIAGNOSIS — I700 Atherosclerosis of aorta: Secondary | ICD-10-CM | POA: Diagnosis not present

## 2018-08-12 DIAGNOSIS — I10 Essential (primary) hypertension: Secondary | ICD-10-CM | POA: Diagnosis not present

## 2018-08-12 DIAGNOSIS — K210 Gastro-esophageal reflux disease with esophagitis: Secondary | ICD-10-CM | POA: Diagnosis not present

## 2018-08-12 DIAGNOSIS — E1169 Type 2 diabetes mellitus with other specified complication: Secondary | ICD-10-CM | POA: Diagnosis not present

## 2018-08-12 DIAGNOSIS — Z7189 Other specified counseling: Secondary | ICD-10-CM | POA: Diagnosis not present

## 2018-08-13 ENCOUNTER — Encounter: Payer: Self-pay | Admitting: Gastroenterology

## 2018-08-26 DIAGNOSIS — E1140 Type 2 diabetes mellitus with diabetic neuropathy, unspecified: Secondary | ICD-10-CM | POA: Diagnosis not present

## 2018-08-26 DIAGNOSIS — Z136 Encounter for screening for cardiovascular disorders: Secondary | ICD-10-CM | POA: Diagnosis not present

## 2018-08-26 DIAGNOSIS — E1165 Type 2 diabetes mellitus with hyperglycemia: Secondary | ICD-10-CM | POA: Diagnosis not present

## 2018-08-31 DIAGNOSIS — M810 Age-related osteoporosis without current pathological fracture: Secondary | ICD-10-CM | POA: Diagnosis not present

## 2018-09-07 DIAGNOSIS — E113292 Type 2 diabetes mellitus with mild nonproliferative diabetic retinopathy without macular edema, left eye: Secondary | ICD-10-CM | POA: Diagnosis not present

## 2018-09-07 DIAGNOSIS — Z961 Presence of intraocular lens: Secondary | ICD-10-CM | POA: Diagnosis not present

## 2018-09-07 DIAGNOSIS — H26493 Other secondary cataract, bilateral: Secondary | ICD-10-CM | POA: Diagnosis not present

## 2018-09-15 DIAGNOSIS — Z9641 Presence of insulin pump (external) (internal): Secondary | ICD-10-CM | POA: Diagnosis not present

## 2018-09-15 DIAGNOSIS — E782 Mixed hyperlipidemia: Secondary | ICD-10-CM | POA: Diagnosis not present

## 2018-09-15 DIAGNOSIS — E1169 Type 2 diabetes mellitus with other specified complication: Secondary | ICD-10-CM | POA: Diagnosis not present

## 2018-09-15 DIAGNOSIS — E1165 Type 2 diabetes mellitus with hyperglycemia: Secondary | ICD-10-CM | POA: Diagnosis not present

## 2018-09-15 DIAGNOSIS — I10 Essential (primary) hypertension: Secondary | ICD-10-CM | POA: Diagnosis not present

## 2018-09-22 DIAGNOSIS — H26492 Other secondary cataract, left eye: Secondary | ICD-10-CM | POA: Diagnosis not present

## 2018-09-23 ENCOUNTER — Other Ambulatory Visit: Payer: Self-pay

## 2018-10-05 DIAGNOSIS — E1165 Type 2 diabetes mellitus with hyperglycemia: Secondary | ICD-10-CM | POA: Diagnosis not present

## 2018-10-05 DIAGNOSIS — Z9641 Presence of insulin pump (external) (internal): Secondary | ICD-10-CM | POA: Diagnosis not present

## 2018-10-05 DIAGNOSIS — I10 Essential (primary) hypertension: Secondary | ICD-10-CM | POA: Diagnosis not present

## 2018-10-05 DIAGNOSIS — E1140 Type 2 diabetes mellitus with diabetic neuropathy, unspecified: Secondary | ICD-10-CM | POA: Diagnosis not present

## 2018-10-05 DIAGNOSIS — E782 Mixed hyperlipidemia: Secondary | ICD-10-CM | POA: Diagnosis not present

## 2018-10-06 DIAGNOSIS — H26491 Other secondary cataract, right eye: Secondary | ICD-10-CM | POA: Diagnosis not present

## 2018-10-07 DIAGNOSIS — E113412 Type 2 diabetes mellitus with severe nonproliferative diabetic retinopathy with macular edema, left eye: Secondary | ICD-10-CM | POA: Diagnosis not present

## 2018-10-07 DIAGNOSIS — H3561 Retinal hemorrhage, right eye: Secondary | ICD-10-CM | POA: Diagnosis not present

## 2018-10-07 DIAGNOSIS — H359 Unspecified retinal disorder: Secondary | ICD-10-CM | POA: Diagnosis not present

## 2018-10-07 DIAGNOSIS — E113411 Type 2 diabetes mellitus with severe nonproliferative diabetic retinopathy with macular edema, right eye: Secondary | ICD-10-CM | POA: Diagnosis not present

## 2018-10-11 DIAGNOSIS — Z79899 Other long term (current) drug therapy: Secondary | ICD-10-CM | POA: Diagnosis not present

## 2018-10-11 DIAGNOSIS — D696 Thrombocytopenia, unspecified: Secondary | ICD-10-CM | POA: Diagnosis not present

## 2018-10-11 DIAGNOSIS — M310 Hypersensitivity angiitis: Secondary | ICD-10-CM | POA: Diagnosis not present

## 2018-10-11 DIAGNOSIS — M150 Primary generalized (osteo)arthritis: Secondary | ICD-10-CM | POA: Diagnosis not present

## 2018-10-11 DIAGNOSIS — L4050 Arthropathic psoriasis, unspecified: Secondary | ICD-10-CM | POA: Diagnosis not present

## 2018-10-11 DIAGNOSIS — M810 Age-related osteoporosis without current pathological fracture: Secondary | ICD-10-CM | POA: Diagnosis not present

## 2018-10-11 DIAGNOSIS — R319 Hematuria, unspecified: Secondary | ICD-10-CM | POA: Diagnosis not present

## 2018-10-11 DIAGNOSIS — E559 Vitamin D deficiency, unspecified: Secondary | ICD-10-CM | POA: Diagnosis not present

## 2018-10-11 DIAGNOSIS — M79643 Pain in unspecified hand: Secondary | ICD-10-CM | POA: Diagnosis not present

## 2018-10-20 DIAGNOSIS — L4050 Arthropathic psoriasis, unspecified: Secondary | ICD-10-CM | POA: Diagnosis not present

## 2018-10-21 DIAGNOSIS — E113412 Type 2 diabetes mellitus with severe nonproliferative diabetic retinopathy with macular edema, left eye: Secondary | ICD-10-CM | POA: Diagnosis not present

## 2018-10-21 DIAGNOSIS — E113411 Type 2 diabetes mellitus with severe nonproliferative diabetic retinopathy with macular edema, right eye: Secondary | ICD-10-CM | POA: Diagnosis not present

## 2018-10-26 DIAGNOSIS — E113411 Type 2 diabetes mellitus with severe nonproliferative diabetic retinopathy with macular edema, right eye: Secondary | ICD-10-CM | POA: Diagnosis not present

## 2018-11-02 DIAGNOSIS — E113412 Type 2 diabetes mellitus with severe nonproliferative diabetic retinopathy with macular edema, left eye: Secondary | ICD-10-CM | POA: Diagnosis not present

## 2018-11-03 DIAGNOSIS — L4050 Arthropathic psoriasis, unspecified: Secondary | ICD-10-CM | POA: Diagnosis not present

## 2018-11-11 DIAGNOSIS — E113411 Type 2 diabetes mellitus with severe nonproliferative diabetic retinopathy with macular edema, right eye: Secondary | ICD-10-CM | POA: Diagnosis not present

## 2018-11-17 DIAGNOSIS — L4050 Arthropathic psoriasis, unspecified: Secondary | ICD-10-CM | POA: Diagnosis not present

## 2018-11-23 DIAGNOSIS — E113412 Type 2 diabetes mellitus with severe nonproliferative diabetic retinopathy with macular edema, left eye: Secondary | ICD-10-CM | POA: Diagnosis not present

## 2018-12-02 DIAGNOSIS — E113411 Type 2 diabetes mellitus with severe nonproliferative diabetic retinopathy with macular edema, right eye: Secondary | ICD-10-CM | POA: Diagnosis not present

## 2018-12-09 DIAGNOSIS — Z1159 Encounter for screening for other viral diseases: Secondary | ICD-10-CM | POA: Diagnosis not present

## 2018-12-14 DIAGNOSIS — Z1211 Encounter for screening for malignant neoplasm of colon: Secondary | ICD-10-CM | POA: Diagnosis not present

## 2018-12-14 DIAGNOSIS — K449 Diaphragmatic hernia without obstruction or gangrene: Secondary | ICD-10-CM | POA: Diagnosis not present

## 2018-12-14 DIAGNOSIS — K3189 Other diseases of stomach and duodenum: Secondary | ICD-10-CM | POA: Diagnosis not present

## 2018-12-14 DIAGNOSIS — K5710 Diverticulosis of small intestine without perforation or abscess without bleeding: Secondary | ICD-10-CM | POA: Diagnosis not present

## 2018-12-14 DIAGNOSIS — K5730 Diverticulosis of large intestine without perforation or abscess without bleeding: Secondary | ICD-10-CM | POA: Diagnosis not present

## 2018-12-14 DIAGNOSIS — D123 Benign neoplasm of transverse colon: Secondary | ICD-10-CM | POA: Diagnosis not present

## 2018-12-14 DIAGNOSIS — D122 Benign neoplasm of ascending colon: Secondary | ICD-10-CM | POA: Diagnosis not present

## 2018-12-14 DIAGNOSIS — K222 Esophageal obstruction: Secondary | ICD-10-CM | POA: Diagnosis not present

## 2018-12-14 DIAGNOSIS — R1310 Dysphagia, unspecified: Secondary | ICD-10-CM | POA: Diagnosis not present

## 2018-12-15 DIAGNOSIS — L4050 Arthropathic psoriasis, unspecified: Secondary | ICD-10-CM | POA: Diagnosis not present

## 2018-12-17 DIAGNOSIS — D123 Benign neoplasm of transverse colon: Secondary | ICD-10-CM | POA: Diagnosis not present

## 2018-12-17 DIAGNOSIS — D122 Benign neoplasm of ascending colon: Secondary | ICD-10-CM | POA: Diagnosis not present

## 2019-01-03 DIAGNOSIS — E113411 Type 2 diabetes mellitus with severe nonproliferative diabetic retinopathy with macular edema, right eye: Secondary | ICD-10-CM | POA: Diagnosis not present

## 2019-01-03 DIAGNOSIS — E113412 Type 2 diabetes mellitus with severe nonproliferative diabetic retinopathy with macular edema, left eye: Secondary | ICD-10-CM | POA: Diagnosis not present

## 2019-01-03 DIAGNOSIS — H359 Unspecified retinal disorder: Secondary | ICD-10-CM | POA: Diagnosis not present

## 2019-01-12 DIAGNOSIS — E782 Mixed hyperlipidemia: Secondary | ICD-10-CM | POA: Diagnosis not present

## 2019-01-12 DIAGNOSIS — E1165 Type 2 diabetes mellitus with hyperglycemia: Secondary | ICD-10-CM | POA: Diagnosis not present

## 2019-01-12 DIAGNOSIS — I10 Essential (primary) hypertension: Secondary | ICD-10-CM | POA: Diagnosis not present

## 2019-01-12 DIAGNOSIS — L4050 Arthropathic psoriasis, unspecified: Secondary | ICD-10-CM | POA: Diagnosis not present

## 2019-01-19 DIAGNOSIS — E1165 Type 2 diabetes mellitus with hyperglycemia: Secondary | ICD-10-CM | POA: Diagnosis not present

## 2019-01-19 DIAGNOSIS — Z794 Long term (current) use of insulin: Secondary | ICD-10-CM | POA: Diagnosis not present

## 2019-01-19 DIAGNOSIS — I10 Essential (primary) hypertension: Secondary | ICD-10-CM | POA: Diagnosis not present

## 2019-01-19 DIAGNOSIS — E782 Mixed hyperlipidemia: Secondary | ICD-10-CM | POA: Diagnosis not present

## 2019-01-19 DIAGNOSIS — L4050 Arthropathic psoriasis, unspecified: Secondary | ICD-10-CM | POA: Diagnosis not present

## 2019-01-19 DIAGNOSIS — Z9641 Presence of insulin pump (external) (internal): Secondary | ICD-10-CM | POA: Diagnosis not present

## 2019-01-19 DIAGNOSIS — E7800 Pure hypercholesterolemia, unspecified: Secondary | ICD-10-CM | POA: Diagnosis not present

## 2019-01-19 DIAGNOSIS — Z23 Encounter for immunization: Secondary | ICD-10-CM | POA: Diagnosis not present

## 2019-01-19 DIAGNOSIS — E1140 Type 2 diabetes mellitus with diabetic neuropathy, unspecified: Secondary | ICD-10-CM | POA: Diagnosis not present

## 2019-01-24 DIAGNOSIS — E1165 Type 2 diabetes mellitus with hyperglycemia: Secondary | ICD-10-CM | POA: Diagnosis not present

## 2019-01-24 DIAGNOSIS — E782 Mixed hyperlipidemia: Secondary | ICD-10-CM | POA: Diagnosis not present

## 2019-01-24 DIAGNOSIS — I10 Essential (primary) hypertension: Secondary | ICD-10-CM | POA: Diagnosis not present

## 2019-02-07 DIAGNOSIS — E113411 Type 2 diabetes mellitus with severe nonproliferative diabetic retinopathy with macular edema, right eye: Secondary | ICD-10-CM | POA: Diagnosis not present

## 2019-02-07 DIAGNOSIS — E113412 Type 2 diabetes mellitus with severe nonproliferative diabetic retinopathy with macular edema, left eye: Secondary | ICD-10-CM | POA: Diagnosis not present

## 2019-03-07 DIAGNOSIS — M810 Age-related osteoporosis without current pathological fracture: Secondary | ICD-10-CM | POA: Diagnosis not present

## 2019-03-09 DIAGNOSIS — Z79899 Other long term (current) drug therapy: Secondary | ICD-10-CM | POA: Diagnosis not present

## 2019-03-09 DIAGNOSIS — M65312 Trigger thumb, left thumb: Secondary | ICD-10-CM | POA: Diagnosis not present

## 2019-03-09 DIAGNOSIS — M810 Age-related osteoporosis without current pathological fracture: Secondary | ICD-10-CM | POA: Diagnosis not present

## 2019-03-09 DIAGNOSIS — R319 Hematuria, unspecified: Secondary | ICD-10-CM | POA: Diagnosis not present

## 2019-03-09 DIAGNOSIS — E559 Vitamin D deficiency, unspecified: Secondary | ICD-10-CM | POA: Diagnosis not present

## 2019-03-09 DIAGNOSIS — M150 Primary generalized (osteo)arthritis: Secondary | ICD-10-CM | POA: Diagnosis not present

## 2019-03-09 DIAGNOSIS — L4050 Arthropathic psoriasis, unspecified: Secondary | ICD-10-CM | POA: Diagnosis not present

## 2019-03-09 DIAGNOSIS — M79643 Pain in unspecified hand: Secondary | ICD-10-CM | POA: Diagnosis not present

## 2019-03-09 DIAGNOSIS — D696 Thrombocytopenia, unspecified: Secondary | ICD-10-CM | POA: Diagnosis not present

## 2019-03-17 DIAGNOSIS — H359 Unspecified retinal disorder: Secondary | ICD-10-CM | POA: Diagnosis not present

## 2019-03-17 DIAGNOSIS — E113412 Type 2 diabetes mellitus with severe nonproliferative diabetic retinopathy with macular edema, left eye: Secondary | ICD-10-CM | POA: Diagnosis not present

## 2019-03-18 ENCOUNTER — Ambulatory Visit: Payer: Medicare Other | Attending: Internal Medicine

## 2019-03-18 DIAGNOSIS — Z23 Encounter for immunization: Secondary | ICD-10-CM | POA: Insufficient documentation

## 2019-03-18 NOTE — Progress Notes (Signed)
   Covid-19 Vaccination Clinic  Name:  [name]    MRN: [id number] DOB: 1950-05-20  03/18/2019  [name] was observed post Covid-19 immunization for 15 minutes without incidence. She was provided with Vaccine Information Sheet and instruction to access the V-Safe system.   [name] was instructed to call 911 with any severe reactions post vaccine: Marland Kitchen Difficulty breathing  . Swelling of your face and throat  . A fast heartbeat  . A bad rash all over your body  . Dizziness and weakness    Immunizations Administered    Name Date Dose VIS Date Route   Pfizer COVID-19 Vaccine 03/18/2019  9:02 AM 0.3 mL 02/04/2019 Intramuscular   Manufacturer: [place]   Lot: [id number]   [place]: [id number]

## 2019-03-31 DIAGNOSIS — E1165 Type 2 diabetes mellitus with hyperglycemia: Secondary | ICD-10-CM | POA: Diagnosis not present

## 2019-03-31 DIAGNOSIS — L4050 Arthropathic psoriasis, unspecified: Secondary | ICD-10-CM | POA: Diagnosis not present

## 2019-03-31 DIAGNOSIS — I10 Essential (primary) hypertension: Secondary | ICD-10-CM | POA: Diagnosis not present

## 2019-03-31 DIAGNOSIS — E782 Mixed hyperlipidemia: Secondary | ICD-10-CM | POA: Diagnosis not present

## 2019-04-04 DIAGNOSIS — E113412 Type 2 diabetes mellitus with severe nonproliferative diabetic retinopathy with macular edema, left eye: Secondary | ICD-10-CM | POA: Diagnosis not present

## 2019-04-04 DIAGNOSIS — E113411 Type 2 diabetes mellitus with severe nonproliferative diabetic retinopathy with macular edema, right eye: Secondary | ICD-10-CM | POA: Diagnosis not present

## 2019-04-06 DIAGNOSIS — L4050 Arthropathic psoriasis, unspecified: Secondary | ICD-10-CM | POA: Diagnosis not present

## 2019-04-07 DIAGNOSIS — I10 Essential (primary) hypertension: Secondary | ICD-10-CM | POA: Diagnosis not present

## 2019-04-07 DIAGNOSIS — E782 Mixed hyperlipidemia: Secondary | ICD-10-CM | POA: Diagnosis not present

## 2019-04-07 DIAGNOSIS — E1140 Type 2 diabetes mellitus with diabetic neuropathy, unspecified: Secondary | ICD-10-CM | POA: Diagnosis not present

## 2019-04-07 DIAGNOSIS — E1169 Type 2 diabetes mellitus with other specified complication: Secondary | ICD-10-CM | POA: Diagnosis not present

## 2019-04-07 DIAGNOSIS — Z794 Long term (current) use of insulin: Secondary | ICD-10-CM | POA: Diagnosis not present

## 2019-04-07 DIAGNOSIS — M810 Age-related osteoporosis without current pathological fracture: Secondary | ICD-10-CM | POA: Diagnosis not present

## 2019-04-07 DIAGNOSIS — Z9641 Presence of insulin pump (external) (internal): Secondary | ICD-10-CM | POA: Diagnosis not present

## 2019-04-08 ENCOUNTER — Ambulatory Visit: Payer: Medicare Other | Attending: Internal Medicine

## 2019-04-08 DIAGNOSIS — Z23 Encounter for immunization: Secondary | ICD-10-CM

## 2019-04-08 NOTE — Progress Notes (Signed)
   Covid-19 Vaccination Clinic  Name:  Shannon Craig    MRN: [id number] DOB: 11/15/1950  04/08/2019  Ms. Grieb was observed post Covid-19 immunization for 15 minutes without incidence. She was provided with Vaccine Information Sheet and instruction to access the V-Safe system.   [name] was instructed to call 911 with any severe reactions post vaccine: Marland Kitchen Difficulty breathing  . Swelling of your face and throat  . A fast heartbeat  . A bad rash all over your body  . Dizziness and weakness    Immunizations Administered    Name Date Dose VIS Date Route   Pfizer COVID-19 Vaccine 04/08/2019  8:52 AM 0.3 mL 02/04/2019 Intramuscular   Manufacturer: [place]   Lot: [id number]   [place]: [id number]

## 2019-04-20 DIAGNOSIS — H35031 Hypertensive retinopathy, right eye: Secondary | ICD-10-CM | POA: Diagnosis not present

## 2019-04-20 DIAGNOSIS — E113411 Type 2 diabetes mellitus with severe nonproliferative diabetic retinopathy with macular edema, right eye: Secondary | ICD-10-CM | POA: Diagnosis not present

## 2019-04-20 DIAGNOSIS — R0683 Snoring: Secondary | ICD-10-CM | POA: Diagnosis not present

## 2019-04-25 DIAGNOSIS — E113412 Type 2 diabetes mellitus with severe nonproliferative diabetic retinopathy with macular edema, left eye: Secondary | ICD-10-CM | POA: Diagnosis not present

## 2019-04-27 DIAGNOSIS — M7751 Other enthesopathy of right foot: Secondary | ICD-10-CM | POA: Diagnosis not present

## 2019-04-27 DIAGNOSIS — G4730 Sleep apnea, unspecified: Secondary | ICD-10-CM | POA: Diagnosis not present

## 2019-04-27 DIAGNOSIS — M19071 Primary osteoarthritis, right ankle and foot: Secondary | ICD-10-CM | POA: Diagnosis not present

## 2019-04-27 DIAGNOSIS — M7661 Achilles tendinitis, right leg: Secondary | ICD-10-CM | POA: Diagnosis not present

## 2019-05-04 DIAGNOSIS — L4050 Arthropathic psoriasis, unspecified: Secondary | ICD-10-CM | POA: Diagnosis not present

## 2019-05-25 DIAGNOSIS — H3561 Retinal hemorrhage, right eye: Secondary | ICD-10-CM | POA: Diagnosis not present

## 2019-05-25 DIAGNOSIS — E113411 Type 2 diabetes mellitus with severe nonproliferative diabetic retinopathy with macular edema, right eye: Secondary | ICD-10-CM | POA: Diagnosis not present

## 2019-05-25 DIAGNOSIS — H35031 Hypertensive retinopathy, right eye: Secondary | ICD-10-CM | POA: Diagnosis not present

## 2019-06-02 ENCOUNTER — Ambulatory Visit (INDEPENDENT_AMBULATORY_CARE_PROVIDER_SITE_OTHER): Payer: Medicare Other | Admitting: Ophthalmology

## 2019-06-02 ENCOUNTER — Other Ambulatory Visit: Payer: Self-pay

## 2019-06-02 ENCOUNTER — Encounter (INDEPENDENT_AMBULATORY_CARE_PROVIDER_SITE_OTHER): Payer: Self-pay | Admitting: Ophthalmology

## 2019-06-02 DIAGNOSIS — Z961 Presence of intraocular lens: Secondary | ICD-10-CM | POA: Diagnosis not present

## 2019-06-02 DIAGNOSIS — H35031 Hypertensive retinopathy, right eye: Secondary | ICD-10-CM | POA: Diagnosis not present

## 2019-06-02 DIAGNOSIS — H359 Unspecified retinal disorder: Secondary | ICD-10-CM | POA: Diagnosis not present

## 2019-06-02 DIAGNOSIS — R0683 Snoring: Secondary | ICD-10-CM | POA: Diagnosis not present

## 2019-06-02 DIAGNOSIS — E113411 Type 2 diabetes mellitus with severe nonproliferative diabetic retinopathy with macular edema, right eye: Secondary | ICD-10-CM | POA: Diagnosis not present

## 2019-06-02 DIAGNOSIS — E113412 Type 2 diabetes mellitus with severe nonproliferative diabetic retinopathy with macular edema, left eye: Secondary | ICD-10-CM | POA: Diagnosis not present

## 2019-06-02 DIAGNOSIS — L4050 Arthropathic psoriasis, unspecified: Secondary | ICD-10-CM | POA: Diagnosis not present

## 2019-06-02 MED ORDER — BEVACIZUMAB CHEMO INJECTION 1.25MG/0.05ML SYRINGE FOR KALEIDOSCOPE
1.2500 mg | INTRAVITREAL | Status: AC | PRN
  Administered 2019-06-02: 09:00:00 1.25 mg via INTRAVITREAL

## 2019-06-02 NOTE — Progress Notes (Signed)
06/02/2019     CHIEF COMPLAINT Patient presents for Diabetic Retinopathy without Macular Edema   HISTORY OF PRESENT ILLNESS: Shannon Craig is a 69 y.o. female who presents to the clinic today for:   HPI    5 week follow up - OCT OU, Possible Avastin OS,, hx of CSME Patient denies change in vision and overall has no complaints, except for allergies. LBS 89 this AM A1C 7.05 Apr 2019    Last edited by Hurman Horn, MD on 06/02/2019  8:59 AM. (History)      Referring physician: Merrilee Seashore, MD [place],  [place] [address]  HISTORICAL INFORMATION:   Selected notes from the [facility]: No current outpatient medications on file. (Ophthalmic Drugs)   No current facility-administered medications for this visit. (Ophthalmic Drugs)   Current Outpatient Medications (Other)  Medication Sig  . Certolizumab Pegol (CIMZIA) 2 X 200 MG KIT Inject 1 mL into the skin.  Marland Kitchen insulin aspart protamine - aspart (NOVOLOG MIX 70/30 FLEXPEN) (70-30) 100 UNIT/ML FlexPen inject 25 units before breakfast and 10 units before dinner subcutaneously  . Albiglutide (TANZEUM) 50 MG PEN Inject into the skin.  . Alogliptin Benzoate (NESINA) 25 MG TABS Take by mouth.  Marland Kitchen atorvastatin (LIPITOR) 40 MG tablet Take 40 mg by mouth daily.  . Calcium-Vitamin D-Vitamin K (VIACTIV PO) Take 1 tablet by mouth daily.  . Canagliflozin (INVOKANA) 300 MG TABS Take 300 mg by mouth daily.  Marland Kitchen diltiazem (CARDIZEM) 120 MG tablet Take by mouth.  . empagliflozin (JARDIANCE) 25 MG TABS tablet Take 25 mg by mouth daily.  . InFLIXimab (REMICADE IV) Inject into the vein.  . meloxicam (MOBIC) 15 MG tablet   . Multiple Vitamin (MULTIVITAMIN) tablet Take 1 tablet by mouth daily.  . pioglitazone (ACTOS) 15 MG tablet Take 15 mg by mouth daily.  . ramipril (ALTACE) 5 MG capsule Take 5 mg by mouth daily.  . traMADol (ULTRAM) 50 MG tablet Take 1 tablet (50 mg total) by  mouth every 6 (six) hours as needed. (Patient not taking: Reported on 06/02/2019)   No current facility-administered medications for this visit. (Other)      REVIEW OF SYSTEMS:    ALLERGIES Allergies  Allergen Reactions  . Darvocet [Propoxyphene N-Acetaminophen]   . Gadolinium Derivatives Hives  . Methotrexate Derivatives   . Percodan [Oxycodone-Aspirin]     PAST MEDICAL HISTORY Past Medical History:  Diagnosis Date  . Diabetes mellitus ([place])   . Diverticula of colon   . External hemorrhoid   . Hypertension   . Internal hemorrhoid   . Kidney stones   . Liver nodule   . Nodule of kidney    Past Surgical History:  Procedure Laterality Date  . ABDOMINAL HYSTERECTOMY  1991  . BREAST REDUCTION SURGERY  1985  . HAND SURGERY  2001   left, pins  . LASIK  2005   bilateral  . LITHOTRIPSY  1983    FAMILY HISTORY Family History  Problem Relation Age of Onset  . Breast cancer Mother   . Diabetes Father   . Prostate cancer Maternal Uncle   . Stroke Maternal Grandmother   . Heart attack Maternal Grandfather   . Diabetes Brother     SOCIAL HISTORY Social History   Tobacco Use  . Smoking status: Never Smoker  . Smokeless tobacco: Never Used  Substance Use Topics  . Alcohol use: No  .  Drug use: No         OPHTHALMIC EXAM:  Base Eye Exam    Visual Acuity (Snellen - Linear)      Right Left   Dist [place] 20/20 20/25 -1       Tonometry (Tonopen, 8:13 AM)      Right Left   Pressure 19 17       Pupils      Pupils Dark Light Shape React APD   Right PERRL 3 2 Round Brisk None   Left PERRL 3 2 Round Brisk None       Visual Fields (Counting fingers)      Left Right    Full Full       Extraocular Movement      Right Left    Full Full       Neuro/Psych    Oriented x3: Yes   Mood/Affect: Normal       Dilation    Left eye: 1.0% Mydriacyl, 2.5% Phenylephrine @ 8:14 AM        Slit Lamp and Fundus Exam    External Exam      Right Left   External  Normal Normal       Slit Lamp Exam      Right Left   Lids/Lashes Normal Normal   Conjunctiva/Sclera White and quiet White and quiet   Cornea Clear Clear   Anterior Chamber Deep and quiet Deep and quiet   Iris Round and reactive Round and reactive   Lens Posterior chamber intraocular lens Posterior chamber intraocular lens   Vitreous Normal Normal       Fundus Exam      Right Left   C/D Ratio  0.5-6   Macula  Macular thickening, Clinically significant macular edema, Mild clinically significant macular edema, Exudates, Microaneurysms   Vessels  Retinopathy, severe nonproliferative diabetic retinopathy   Periphery  Peripheral retinal nonperfusion          IMAGING AND PROCEDURES  Imaging and Procedures for 06/02/19  OCT, Retina - OU - Both Eyes       Right Eye Quality was good. Scan locations included subfoveal. Central Foveal Thickness: 249.   Left Eye Quality was good. Scan locations included subfoveal. Central Foveal Thickness: 242.   Notes OD, clinically significant macular edema persist superior to the foveal region.,  Although focally improved.  OS clinically significant macular edema profound superior to the foveal region, will need repeat Avastin OS today.       Intravitreal Injection, Pharmacologic Agent - OS - Left Eye       Time Out 06/02/2019. 9:03 AM. Confirmed correct patient, procedure, site, and patient consented.   Anesthesia Topical anesthesia was used.   Procedure Preparation included 5% betadine to ocular surface, 10% betadine to eyelids. A 30 gauge needle was used.   Injection:  1.25 mg Bevacizumab (AVASTIN) SOLN   NDC: 70360-001-02, Lot: [id number]   Route: Intravitreal, Site: Left Eye, Waste: 0 mg  Post-op Post injection exam found visual acuity of at least counting fingers. The patient tolerated the procedure well. There were no complications. The patient received written and verbal post procedure care education.                   ASSESSMENT/PLAN:    ICD-10-CM   1. Severe nonproliferative diabetic retinopathy of right eye, with macular edema, associated with type 2 diabetes mellitus (HCC)  E11.3411 OCT, Retina - OU - Both Eyes  2. Severe nonproliferative  diabetic retinopathy of left eye, with macular edema, associated with type 2 diabetes mellitus (HCC)  H72.9021 OCT, Retina - OU - Both Eyes    Intravitreal Injection, Pharmacologic Agent - OS - Left Eye    Bevacizumab (AVASTIN) SOLN 1.25 mg  3. Retinal exudates and deposits  H35.9   4. Pseudophakia  Z96.1   5. Hypertensive retinopathy of right eye  H35.031   6. Snoring  R06.83     1.  2.  3.  Ophthalmic Meds Ordered this visit:  Meds ordered this encounter  Medications  . Bevacizumab (AVASTIN) SOLN 1.25 mg       No follow-ups on file.  There are no Patient Instructions on file for this visit.   Explained the diagnoses, plan, and follow up with the patient and they expressed understanding.  Patient expressed understanding of the importance of proper follow up care.   Clent Demark Raquell Richer M.D. Diseases & Surgery of the Retina and Vitreous Retina & Diabetic [place] 06/02/19     Abbreviations: M myopia (nearsighted); A astigmatism; H hyperopia (farsighted); P presbyopia; Mrx spectacle prescription;  CTL contact lenses; OD right eye; OS left eye; OU both eyes  XT exotropia; ET esotropia; PEK punctate epithelial keratitis; PEE punctate epithelial erosions; DES dry eye syndrome; MGD meibomian gland dysfunction; ATs artificial tears; PFAT's preservative free artificial tears; [place] nuclear sclerotic cataract; PSC posterior subcapsular cataract; ERM epi-retinal membrane; PVD posterior vitreous detachment; RD retinal detachment; DM diabetes mellitus; DR diabetic retinopathy; NPDR non-proliferative diabetic retinopathy; PDR proliferative diabetic retinopathy; CSME clinically significant macular edema; DME diabetic macular edema; dbh dot blot hemorrhages; CWS  cotton wool spot; POAG primary open angle glaucoma; C/D cup-to-disc ratio; HVF humphrey visual field; GVF goldmann visual field; OCT optical coherence tomography; IOP intraocular pressure; BRVO Branch retinal vein occlusion; CRVO central retinal vein occlusion; CRAO central retinal artery occlusion; BRAO branch retinal artery occlusion; RT retinal tear; SB scleral buckle; PPV pars plana vitrectomy; VH Vitreous hemorrhage; PRP panretinal laser photocoagulation; IVK intravitreal kenalog; VMT vitreomacular traction; MH Macular hole;  NVD neovascularization of the disc; NVE neovascularization elsewhere; AREDS age related eye disease study; ARMD age related macular degeneration; POAG primary open angle glaucoma; EBMD epithelial/anterior basement membrane dystrophy; ACIOL anterior chamber intraocular lens; IOL intraocular lens; PCIOL posterior chamber intraocular lens; Phaco/IOL phacoemulsification with intraocular lens placement; [place] photorefractive keratectomy; LASIK laser assisted in situ keratomileusis; HTN hypertension; DM diabetes mellitus; COPD chronic obstructive pulmonary disease

## 2019-06-23 DIAGNOSIS — R319 Hematuria, unspecified: Secondary | ICD-10-CM | POA: Diagnosis not present

## 2019-06-23 DIAGNOSIS — M810 Age-related osteoporosis without current pathological fracture: Secondary | ICD-10-CM | POA: Diagnosis not present

## 2019-06-23 DIAGNOSIS — E559 Vitamin D deficiency, unspecified: Secondary | ICD-10-CM | POA: Diagnosis not present

## 2019-06-23 DIAGNOSIS — M79673 Pain in unspecified foot: Secondary | ICD-10-CM | POA: Diagnosis not present

## 2019-06-23 DIAGNOSIS — M7660 Achilles tendinitis, unspecified leg: Secondary | ICD-10-CM | POA: Diagnosis not present

## 2019-06-23 DIAGNOSIS — M79643 Pain in unspecified hand: Secondary | ICD-10-CM | POA: Diagnosis not present

## 2019-06-23 DIAGNOSIS — Z79899 Other long term (current) drug therapy: Secondary | ICD-10-CM | POA: Diagnosis not present

## 2019-06-23 DIAGNOSIS — L4050 Arthropathic psoriasis, unspecified: Secondary | ICD-10-CM | POA: Diagnosis not present

## 2019-06-23 DIAGNOSIS — M150 Primary generalized (osteo)arthritis: Secondary | ICD-10-CM | POA: Diagnosis not present

## 2019-06-23 DIAGNOSIS — D696 Thrombocytopenia, unspecified: Secondary | ICD-10-CM | POA: Diagnosis not present

## 2019-06-30 DIAGNOSIS — M67871 Other specified disorders of synovium, right ankle and foot: Secondary | ICD-10-CM | POA: Diagnosis not present

## 2019-07-01 DIAGNOSIS — L4050 Arthropathic psoriasis, unspecified: Secondary | ICD-10-CM | POA: Diagnosis not present

## 2019-07-07 ENCOUNTER — Other Ambulatory Visit: Payer: Self-pay

## 2019-07-07 ENCOUNTER — Encounter (INDEPENDENT_AMBULATORY_CARE_PROVIDER_SITE_OTHER): Payer: Self-pay | Admitting: Ophthalmology

## 2019-07-07 ENCOUNTER — Ambulatory Visit (INDEPENDENT_AMBULATORY_CARE_PROVIDER_SITE_OTHER): Payer: Medicare Other | Admitting: Ophthalmology

## 2019-07-07 DIAGNOSIS — E113411 Type 2 diabetes mellitus with severe nonproliferative diabetic retinopathy with macular edema, right eye: Secondary | ICD-10-CM | POA: Diagnosis not present

## 2019-07-07 DIAGNOSIS — E113412 Type 2 diabetes mellitus with severe nonproliferative diabetic retinopathy with macular edema, left eye: Secondary | ICD-10-CM | POA: Diagnosis not present

## 2019-07-07 MED ORDER — BEVACIZUMAB CHEMO INJECTION 1.25MG/0.05ML SYRINGE FOR KALEIDOSCOPE
1.2500 mg | INTRAVITREAL | Status: AC | PRN
  Administered 2019-07-07: 1.25 mg via INTRAVITREAL

## 2019-07-07 NOTE — Progress Notes (Signed)
07/07/2019     CHIEF COMPLAINT Patient presents for Diabetic Eye Exam   HISTORY OF PRESENT ILLNESS: Shannon Craig is a 69 y.o. female who presents to the clinic today for:   HPI    Diabetic Eye Exam    Vision is stable.  Associated Symptoms Negative for Flashes and Floaters.  Diabetes characteristics include Type 2.  Last Blood Glucose 129.  Last A1C 7.9.  I, the attending physician,  performed the HPI with the patient and updated documentation appropriately.          Comments    6 Week Diabetic Exam OD. Possible Avastin OD. OCT  Pt states vision is stable. Denies FOL and floaters.       Last edited by Tilda Franco on 07/07/2019  8:40 AM. (History)      Referring physician: Merrilee Seashore, MD [address] [place],  [place] [address]  HISTORICAL INFORMATION:   Selected notes from the MEDICAL RECORD NUMBER       CURRENT MEDICATIONS: No current outpatient medications on file. (Ophthalmic Drugs)   No current facility-administered medications for this visit. (Ophthalmic Drugs)   Current Outpatient Medications (Other)  Medication Sig  . Albiglutide (TANZEUM) 50 MG PEN Inject into the skin.  . Alogliptin Benzoate (NESINA) 25 MG TABS Take by mouth.  Marland Kitchen atorvastatin (LIPITOR) 40 MG tablet Take 40 mg by mouth daily.  . Calcium-Vitamin D-Vitamin K (VIACTIV PO) Take 1 tablet by mouth daily.  . Canagliflozin (INVOKANA) 300 MG TABS Take 300 mg by mouth daily.  . Certolizumab Pegol (CIMZIA) 2 X 200 MG KIT Inject 1 mL into the skin.  Marland Kitchen diltiazem (CARDIZEM) 120 MG tablet Take by mouth.  . empagliflozin (JARDIANCE) 25 MG TABS tablet Take 25 mg by mouth daily.  . InFLIXimab (REMICADE IV) Inject into the vein.  Marland Kitchen insulin aspart protamine - aspart (NOVOLOG MIX 70/30 FLEXPEN) (70-30) 100 UNIT/ML FlexPen inject 25 units before breakfast and 10 units before dinner subcutaneously  . meloxicam (MOBIC) 15 MG tablet   . Multiple Vitamin (MULTIVITAMIN) tablet Take 1  tablet by mouth daily.  . pioglitazone (ACTOS) 15 MG tablet Take 15 mg by mouth daily.  . ramipril (ALTACE) 5 MG capsule Take 5 mg by mouth daily.  . traMADol (ULTRAM) 50 MG tablet Take 1 tablet (50 mg total) by mouth every 6 (six) hours as needed. (Patient not taking: Reported on 06/02/2019)   No current facility-administered medications for this visit. (Other)      REVIEW OF SYSTEMS: ROS    Positive for: Endocrine   Last edited by Tilda Franco on 07/07/2019  8:40 AM. (History)       ALLERGIES Allergies  Allergen Reactions  . Darvocet [Propoxyphene N-Acetaminophen]   . Gadolinium Derivatives Hives  . Methotrexate Derivatives   . Percodan [Oxycodone-Aspirin]     PAST MEDICAL HISTORY Past Medical History:  Diagnosis Date  . Diabetes mellitus ([place])   . Diverticula of colon   . External hemorrhoid   . Hypertension   . Internal hemorrhoid   . Kidney stones   . Liver nodule   . Nodule of kidney    Past Surgical History:  Procedure Laterality Date  . ABDOMINAL HYSTERECTOMY  1991  . BREAST REDUCTION SURGERY  1985  . HAND SURGERY  2001   left, pins  . LASIK  2005   bilateral  . LITHOTRIPSY  1983    FAMILY HISTORY Family History  Problem Relation Age of Onset  .  Breast cancer Mother   . Diabetes Father   . Prostate cancer Maternal Uncle   . Stroke Maternal Grandmother   . Heart attack Maternal Grandfather   . Diabetes Brother     SOCIAL HISTORY Social History   Tobacco Use  . Smoking status: Never Smoker  . Smokeless tobacco: Never Used  Substance Use Topics  . Alcohol use: No  . Drug use: No         OPHTHALMIC EXAM:  Base Eye Exam    Visual Acuity (Snellen - Linear)      Right Left   Dist [place] 20/25 + 20/30 -2       Tonometry (Tonopen, 8:44 AM)      Right Left   Pressure 15 16       Pupils      Dark Light Shape React APD   Right 3 2 Round Brisk None   Left 3 2 Round Brisk None       Visual Fields (Counting fingers)      Left  Right    Full Full       Neuro/Psych    Oriented x3: Yes   Mood/Affect: Normal       Dilation    Right eye: 1.0% Mydriacyl, 2.5% Phenylephrine @ 8:44 AM        Slit Lamp and Fundus Exam    External Exam      Right Left   External Normal Normal       Slit Lamp Exam      Right Left   Lids/Lashes Normal Normal   Conjunctiva/Sclera White and quiet White and quiet   Cornea Clear Clear   Anterior Chamber Deep and quiet Deep and quiet   Iris Round and reactive Round and reactive   Lens Posterior chamber intraocular lens Posterior chamber intraocular lens   Anterior Vitreous Normal Normal       Fundus Exam      Right Left   Posterior Vitreous Normal    Disc Peripapillary atrophy    C/D Ratio 0.7    Macula Mild clinically significant macular edema, Microaneurysms    Vessels NPDR severe    Periphery Good PRP nasal and superiorly.  Room inferiorly for PRP.           IMAGING AND PROCEDURES  Imaging and Procedures for 07/07/19  OCT, Retina - OU - Both Eyes       Right Eye Quality was good. Scan locations included subfoveal. Central Foveal Thickness: 250. Progression has improved. Findings include abnormal foveal contour.   Left Eye Quality was good. Scan locations included subfoveal. Central Foveal Thickness: 226. Progression has improved. Findings include abnormal foveal contour.   Notes OD, much improved extra foveal CSME superior to the FAZ.  We will repeat intravitreal Avastin OD today and consider focal laser photocoagulation for long-term treatment of this condition and diminish intravitreal Avastin  OS, large region of CSME superior to the foveal avascular zone much improved now 4 weeks post recent intravitreal Avastin.  Will reexamine soon.       Intravitreal Injection, Pharmacologic Agent - OD - Right Eye       Time Out 07/07/2019. 9:24 AM. Confirmed correct patient, procedure, site, and patient consented.   Anesthesia Topical anesthesia was used.  Anesthetic medications included [place] 3.5%.   Procedure Preparation included Ofloxacin , 10% betadine to eyelids, 5% betadine to ocular surface. A 30 gauge needle was used.   Injection:  1.25 mg Bevacizumab (AVASTIN) SOLN  NDC: [phone], Lot: [id number]   Route: Intravitreal, Site: Right Eye, Waste: 0 mg  Post-op Post injection exam found visual acuity of at least counting fingers. The patient tolerated the procedure well. There were no complications. The patient received written and verbal post procedure care education. Post injection medications were not given.                 ASSESSMENT/PLAN:  No problem-specific Assessment & Plan notes found for this encounter.      ICD-10-CM   1. Severe nonproliferative diabetic retinopathy of right eye, with macular edema, associated with type 2 diabetes mellitus (HCC)  E11.3411 OCT, Retina - OU - Both Eyes    Intravitreal Injection, Pharmacologic Agent - OD - Right Eye    Bevacizumab (AVASTIN) SOLN 1.25 mg  2. Severe nonproliferative diabetic retinopathy of left eye, with macular edema, associated with type 2 diabetes mellitus (HCC)  R83.0940 OCT, Retina - OU - Both Eyes    1.OD, much improved extra foveal CSME superior to the FAZ.  We will repeat intravitreal Avastin OD today and consider focal laser photocoagulation for long-term treatment of this condition and diminish intravitreal Avastin  2.OS, large region of CSME superior to the foveal avascular zone much improved now 4 weeks post recent intravitreal Avastin.  Will reexamine soon.  3.  Ophthalmic Meds Ordered this visit:  Meds ordered this encounter  Medications  . Bevacizumab (AVASTIN) SOLN 1.25 mg       Return in about 3 weeks (around 07/28/2019) for FOCAL, OD.  There are no Patient Instructions on file for this visit.   Explained the diagnoses, plan, and follow up with the patient and they expressed understanding.  Patient expressed understanding of the importance of  proper follow up care.   Clent Demark Chirstopher Iovino M.D. Diseases & Surgery of the Retina and Vitreous Retina & Diabetic [place] 07/07/19     Abbreviations: M myopia (nearsighted); A astigmatism; H hyperopia (farsighted); P presbyopia; Mrx spectacle prescription;  CTL contact lenses; OD right eye; OS left eye; OU both eyes  XT exotropia; ET esotropia; PEK punctate epithelial keratitis; PEE punctate epithelial erosions; DES dry eye syndrome; MGD meibomian gland dysfunction; ATs artificial tears; PFAT's preservative free artificial tears; [place] nuclear sclerotic cataract; PSC posterior subcapsular cataract; ERM epi-retinal membrane; PVD posterior vitreous detachment; RD retinal detachment; DM diabetes mellitus; DR diabetic retinopathy; NPDR non-proliferative diabetic retinopathy; PDR proliferative diabetic retinopathy; CSME clinically significant macular edema; DME diabetic macular edema; dbh dot blot hemorrhages; CWS cotton wool spot; POAG primary open angle glaucoma; C/D cup-to-disc ratio; HVF humphrey visual field; GVF goldmann visual field; OCT optical coherence tomography; IOP intraocular pressure; BRVO Branch retinal vein occlusion; CRVO central retinal vein occlusion; CRAO central retinal artery occlusion; BRAO branch retinal artery occlusion; RT retinal tear; SB scleral buckle; PPV pars plana vitrectomy; VH Vitreous hemorrhage; PRP panretinal laser photocoagulation; IVK intravitreal kenalog; VMT vitreomacular traction; MH Macular hole;  NVD neovascularization of the disc; NVE neovascularization elsewhere; AREDS age related eye disease study; ARMD age related macular degeneration; POAG primary open angle glaucoma; EBMD epithelial/anterior basement membrane dystrophy; ACIOL anterior chamber intraocular lens; IOL intraocular lens; PCIOL posterior chamber intraocular lens; Phaco/IOL phacoemulsification with intraocular lens placement; [place] photorefractive keratectomy; LASIK laser assisted in situ keratomileusis;  HTN hypertension; DM diabetes mellitus; COPD chronic obstructive pulmonary disease

## 2019-07-11 ENCOUNTER — Other Ambulatory Visit: Payer: Self-pay

## 2019-07-11 ENCOUNTER — Ambulatory Visit (INDEPENDENT_AMBULATORY_CARE_PROVIDER_SITE_OTHER): Payer: Medicare Other | Admitting: Ophthalmology

## 2019-07-11 ENCOUNTER — Encounter (INDEPENDENT_AMBULATORY_CARE_PROVIDER_SITE_OTHER): Payer: Self-pay | Admitting: Ophthalmology

## 2019-07-11 DIAGNOSIS — E113412 Type 2 diabetes mellitus with severe nonproliferative diabetic retinopathy with macular edema, left eye: Secondary | ICD-10-CM

## 2019-07-11 DIAGNOSIS — E113411 Type 2 diabetes mellitus with severe nonproliferative diabetic retinopathy with macular edema, right eye: Secondary | ICD-10-CM

## 2019-07-11 MED ORDER — BEVACIZUMAB CHEMO INJECTION 1.25MG/0.05ML SYRINGE FOR KALEIDOSCOPE
1.2500 mg | INTRAVITREAL | Status: AC | PRN
  Administered 2019-07-11: 1.25 mg via INTRAVITREAL

## 2019-07-11 NOTE — Patient Instructions (Signed)
Diabetic Retinopathy Diabetic retinopathy is a disease of the retina. The retina is a light-sensitive membrane at the back of the eye. Retinopathy is a complication of diabetes (diabetes mellitus) and a common cause of bad eyesight (visual impairment). It can eventually cause blindness. Early detection and treatment of diabetic retinopathy is important in keeping your eyes healthy and preventing further damage to them. What are the causes? Diabetic retinopathy is caused by blood sugar (glucose) levels that are too high for an extended period of time. High blood glucose over an extended period of time can:  Damage small blood vessels in the retina, allowing blood to leak through the vessel walls.  Cause new, abnormal blood vessels to grow on the retina. This can scar the retina in the advanced stage of diabetic retinopathy. What increases the risk? You are more likely to develop this condition if:  You have had diabetes for a long time.  You have poorly controlled blood glucose.  You have high blood pressure. What are the signs or symptoms? In the early stages of diabetic retinopathy, there are often no symptoms. As the condition gets worse, symptoms may include:  Blurred vision. This is usually caused by swelling due to abnormal blood glucose levels. The blurriness may go away when blood glucose levels return to normal.  Moving specks or dark spots (floaters) in your vision. These can be caused by a small amount of bleeding (hemorrhage) from retinal blood vessels.  Missing parts of your field of vision, such as vision at the sides of the eyes. This can be caused by larger retinal hemorrhages.  Difficulty reading.  Double vision.  Pain in one or both eyes.  Feeling pressure in one or both eyes.  Trouble seeing straight lines. Straight lines may not look straight.  Redness of the eyes that does not go away. How is this diagnosed? This condition may be diagnosed with an eye exam in  which your eye care specialist puts drops in your eyes that enlarge (dilate) your pupils. This lets your health care provider examine your retina and check for changes in your retinal blood vessels. How is this treated? This condition may be treated by:  Keeping your blood glucose and blood pressure within a target range.  Using a type of laser beam to seal your retinal blood vessels. This stops them from bleeding and decreases pressure in your eye.  Getting shots of medicine in the eye to reduce swelling of the center of the retina (macula). You may be given: ? Anti-VEGF medicine. This medicine can help slow vision loss, and may even improve vision. ? Steroid medicine. Follow these instructions at home:   Follow your diabetes management plan as directed by your health care provider. This may include exercising regularly and eating a healthy diet.  Keep your blood glucose level and your blood pressure in your target range, as directed by your health care provider.  Check your blood glucose as often as directed.  Take over the counter and prescription medicines only as told by your health care provider. This includes insulin and oral diabetes medicine.  Get your eyes checked at least once every year. An eye specialist can usually see diabetic retinopathy developing long before it starts to cause problems. In many cases, it can be treated to prevent complications from occurring.  Do not use any products that contain nicotine or tobacco, such as cigarettes and e-cigarettes. If you need help quitting, ask your health care provider.  Keep all follow-up   visits as told by your health care provider. This is important. Contact a health care provider if:  You notice gradual blurring or other changes in your vision over time.  You notice that your glasses or contact lenses do not make things look as sharp as they once did.  You have trouble reading or seeing details at a distance with either  eye.  You notice a change in your vision or notice that parts of your field of vision appear missing or hazy.  You suddenly see moving specks or dark spots in the field of vision of either eye. Get help right away if:  You have sudden pain or pressure in one or both eyes.  You suddenly lose vision or a curtain or veil seems to come across your eyes.  You have a sudden burst of floaters in your vision. Summary  Diabetic retinopathy is a disease of the retina. The retina is a light-sensitive membrane at the back of the eye. Retinopathy is a complication of diabetes.  Get your eyes checked at least once every year. An eye specialist can usually see diabetic retinopathy developing long before it starts to cause problems. In many cases, it can be treated to prevent complications from occurring.  Keep your blood glucose and your blood pressure in target range. Follow your diabetes management plan as directed by your health care provider.  Protect your eyes. Wear sunglasses and eye protection when needed. This information is not intended to replace advice given to you by your health care provider. Make sure you discuss any questions you have with your health care provider. Document Revised: 03/25/2017 Document Reviewed: 03/17/2016 Elsevier Patient Education  2020 [employer]  

## 2019-07-11 NOTE — Assessment & Plan Note (Signed)
CSME OS is still active with a focal region superior to the fovea.  Will repeat treatment today and likely repeat fluorescein angiography with focal laser photocoagulation in 3 to 4 weeks left eye

## 2019-07-11 NOTE — Progress Notes (Signed)
07/11/2019     CHIEF COMPLAINT Patient presents for Diabetic Eye Exam   HISTORY OF PRESENT ILLNESS: [facility] is a 69 y.o. female who presents to the clinic today for:   HPI    Diabetic Eye Exam    Vision is stable.  Associated Symptoms Negative for Flashes and Floaters.  Diabetes characteristics include Type 2.  This started 5 weeks ago.  Blood sugar level fluctuates.  Last Blood Glucose 117.  Last A1C 7.9.  I, the attending physician,  performed the HPI with the patient and updated documentation appropriately.          Comments    5 Week Diabetic Exam OS. Possible Avastin OS. OCT  Pt states vision is stable. Denies any new complaints.       Last edited by Tilda Franco on 07/11/2019  8:53 AM. (History)      Referring physician: Merrilee Seashore, MD [address] [place],  [place] [address]  HISTORICAL INFORMATION:   Selected notes from the [facility]: No current outpatient medications on file. (Ophthalmic Drugs)   No current facility-administered medications for this visit. (Ophthalmic Drugs)   Current Outpatient Medications (Other)  Medication Sig  . Albiglutide (TANZEUM) 50 MG PEN Inject into the skin.  . Alogliptin Benzoate (NESINA) 25 MG TABS Take by mouth.  Marland Kitchen atorvastatin (LIPITOR) 40 MG tablet Take 40 mg by mouth daily.  . Calcium-Vitamin D-Vitamin K (VIACTIV PO) Take 1 tablet by mouth daily.  . Canagliflozin (INVOKANA) 300 MG TABS Take 300 mg by mouth daily.  . Certolizumab Pegol (CIMZIA) 2 X 200 MG KIT Inject 1 mL into the skin.  Marland Kitchen diltiazem (CARDIZEM) 120 MG tablet Take by mouth.  . empagliflozin (JARDIANCE) 25 MG TABS tablet Take 25 mg by mouth daily.  . InFLIXimab (REMICADE IV) Inject into the vein.  Marland Kitchen insulin aspart protamine - aspart (NOVOLOG MIX 70/30 FLEXPEN) (70-30) 100 UNIT/ML FlexPen inject 25 units before breakfast and 10 units before dinner subcutaneously  . meloxicam (MOBIC) 15 MG  tablet   . Multiple Vitamin (MULTIVITAMIN) tablet Take 1 tablet by mouth daily.  . pioglitazone (ACTOS) 15 MG tablet Take 15 mg by mouth daily.  . ramipril (ALTACE) 5 MG capsule Take 5 mg by mouth daily.  . traMADol (ULTRAM) 50 MG tablet Take 1 tablet (50 mg total) by mouth every 6 (six) hours as needed. (Patient not taking: Reported on 06/02/2019)   No current facility-administered medications for this visit. (Other)      REVIEW OF SYSTEMS: ROS    Positive for: Endocrine   Last edited by Tilda Franco on 07/11/2019  8:53 AM. (History)       ALLERGIES Allergies  Allergen Reactions  . Darvocet [Propoxyphene N-Acetaminophen]   . Gadolinium Derivatives Hives  . Methotrexate Derivatives   . Percodan [Oxycodone-Aspirin]     PAST MEDICAL HISTORY Past Medical History:  Diagnosis Date  . Diabetes mellitus ([place])   . Diverticula of colon   . External hemorrhoid   . Hypertension   . Internal hemorrhoid   . Kidney stones   . Liver nodule   . Nodule of kidney    Past Surgical History:  Procedure Laterality Date  . ABDOMINAL HYSTERECTOMY  1991  . BREAST REDUCTION SURGERY  1985  . HAND SURGERY  2001   left, pins  . LASIK  2005   bilateral  . LITHOTRIPSY  1983  FAMILY HISTORY Family History  Problem Relation Age of Onset  . Breast cancer Mother   . Diabetes Father   . Prostate cancer Maternal Uncle   . Stroke Maternal Grandmother   . Heart attack Maternal Grandfather   . Diabetes Brother     SOCIAL HISTORY Social History   Tobacco Use  . Smoking status: Never Smoker  . Smokeless tobacco: Never Used  Substance Use Topics  . Alcohol use: No  . Drug use: No         OPHTHALMIC EXAM:  Base Eye Exam    Visual Acuity (Snellen - Linear)      Right Left   Dist [place] 20/20 -2 20/40   Dist ph [place]  20/20       Tonometry (Tonopen, 8:58 AM)      Right Left   Pressure 15 15       Pupils      Dark Light Shape React APD   Right 3 2 Round Brisk None   Left 3  2 Round Brisk None       Visual Fields (Counting fingers)      Left Right    Full Full       Neuro/Psych    Oriented x3: Yes   Mood/Affect: Normal       Dilation    Left eye: 1.0% Mydriacyl, 2.5% Phenylephrine @ 8:58 AM        Slit Lamp and Fundus Exam    External Exam      Right Left   External Normal Normal       Slit Lamp Exam      Right Left   Lids/Lashes Normal Normal   Conjunctiva/Sclera White and quiet White and quiet   Cornea Clear Clear   Anterior Chamber Deep and quiet Deep and quiet   Iris Round and reactive Round and reactive   Lens Posterior chamber intraocular lens Posterior chamber intraocular lens   Anterior Vitreous Normal Normal       Fundus Exam      Right Left   Posterior Vitreous  Normal   Disc  Normal   C/D Ratio  0.5-6   Macula  Macular thickening, focal clinically significant macular edema, superior to the FAZ region, exudates, Microaneurysms   Vessels  Retinopathy, severe nonproliferative diabetic retinopathy   Periphery  Peripheral retinal nonperfusion          IMAGING AND PROCEDURES  Imaging and Procedures for 07/11/19  OCT, Retina - OU - Both Eyes       Right Eye Quality was good. Scan locations included subfoveal. Progression has improved. Findings include abnormal foveal contour.   Left Eye Quality was good. Scan locations included subfoveal. Central Foveal Thickness: 267. Progression has worsened. Findings include abnormal foveal contour.   Notes OD overall improved and stable upon treatment of clinically significant macular edema.  Small regions superior and inferior to the fovea may need focal laser in the future.  OS, large region superior to the foveal avascular zone of CSME still active will likely need repeat fluorescein angiography and focal laser photocoagulation to the left eye       Intravitreal Injection, Pharmacologic Agent - OS - Left Eye       Time Out 07/11/2019. 10:08 AM. Confirmed correct patient,  procedure, site, and patient consented.   Anesthesia Topical anesthesia was used. Anesthetic medications included Ak[place] 3.5%.   Procedure Preparation included 10% betadine to eyelids, Tobramycin 0.3%, Ofloxacin . A 30 gauge  needle was used.   Injection:  1.25 mg Bevacizumab (AVASTIN) SOLN   NDC: [address]-2, Lot: [id number]   Route: Intravitreal, Site: Left Eye, Waste: 0 mg  Post-op Post injection exam found visual acuity of at least counting fingers. The patient tolerated the procedure well. There were no complications. The patient received written and verbal post procedure care education. Post injection medications were not given.                 ASSESSMENT/PLAN:  Severe nonproliferative diabetic retinopathy of left eye, with macular edema, associated with type 2 diabetes mellitus (HCC) CSME OS is still active with a focal region superior to the fovea.  Will repeat treatment today and likely repeat fluorescein angiography with focal laser photocoagulation in 3 to 4 weeks left eye      ICD-10-CM   1. Severe nonproliferative diabetic retinopathy of left eye, with macular edema, associated with type 2 diabetes mellitus (HCC)  X32.4401 OCT, Retina - OU - Both Eyes    Intravitreal Injection, Pharmacologic Agent - OS - Left Eye    Bevacizumab (AVASTIN) SOLN 1.25 mg  2. Severe nonproliferative diabetic retinopathy of right eye, with macular edema, associated with type 2 diabetes mellitus (HCC)  E11.3411 OCT, Retina - OU - Both Eyes    1.CSME OS is still active with a focal region superior to the fovea.  Will repeat treatment OS with intravitreal Avastin today and likely repeat fluorescein angiography with focal laser photocoagulation in 3 to 4 weeks left eye  2.  Focal laser is now scheduled in the right eye for June 10  3.  Fluorescein angiography and likely focal laser left eye to follow thereafter  Ophthalmic Meds Ordered this visit:  Meds ordered this encounter  Medications   . Bevacizumab (AVASTIN) SOLN 1.25 mg       Return in about 3 weeks (around 08/01/2019) for OPTOS FFA L/R, DILATE OU, FOCAL, OS.  Patient Instructions  Diabetic Retinopathy Diabetic retinopathy is a disease of the retina. The retina is a light-sensitive membrane at the back of the eye. Retinopathy is a complication of diabetes (diabetes mellitus) and a common cause of bad eyesight (visual impairment). It can eventually cause blindness. Early detection and treatment of diabetic retinopathy is important in keeping your eyes healthy and preventing further damage to them. What are the causes? Diabetic retinopathy is caused by blood sugar (glucose) levels that are too high for an extended period of time. High blood glucose over an extended period of time can:  Damage small blood vessels in the retina, allowing blood to leak through the vessel walls.  Cause new, abnormal blood vessels to grow on the retina. This can scar the retina in the advanced stage of diabetic retinopathy. What increases the risk? You are more likely to develop this condition if:  You have had diabetes for a long time.  You have poorly controlled blood glucose.  You have high blood pressure. What are the signs or symptoms? In the early stages of diabetic retinopathy, there are often no symptoms. As the condition gets worse, symptoms may include:  Blurred vision. This is usually caused by swelling due to abnormal blood glucose levels. The blurriness may go away when blood glucose levels return to normal.  Moving specks or dark spots (floaters) in your vision. These can be caused by a small amount of bleeding (hemorrhage) from retinal blood vessels.  Missing parts of your field of vision, such as vision at the sides of the  eyes. This can be caused by larger retinal hemorrhages.  Difficulty reading.  Double vision.  Pain in one or both eyes.  Feeling pressure in one or both eyes.  Trouble seeing straight lines.  Straight lines may not look straight.  Redness of the eyes that does not go away. How is this diagnosed? This condition may be diagnosed with an eye exam in which your eye care specialist puts drops in your eyes that enlarge (dilate) your pupils. This lets your health care provider examine your retina and check for changes in your retinal blood vessels. How is this treated? This condition may be treated by:  Keeping your blood glucose and blood pressure within a target range.  Using a type of laser beam to seal your retinal blood vessels. This stops them from bleeding and decreases pressure in your eye.  Getting shots of medicine in the eye to reduce swelling of the center of the retina (macula). You may be given: ? Anti-VEGF medicine. This medicine can help slow vision loss, and may even improve vision. ? Steroid medicine. Follow these instructions at home:   Follow your diabetes management plan as directed by your health care provider. This may include exercising regularly and eating a healthy diet.  Keep your blood glucose level and your blood pressure in your target range, as directed by your health care provider.  Check your blood glucose as often as directed.  Take over the counter and prescription medicines only as told by your health care provider. This includes insulin and oral diabetes medicine.  Get your eyes checked at least once every year. An eye specialist can usually see diabetic retinopathy developing long before it starts to cause problems. In many cases, it can be treated to prevent complications from occurring.  Do not use any products that contain nicotine or tobacco, such as cigarettes and e-cigarettes. If you need help quitting, ask your health care provider.  Keep all follow-up visits as told by your health care provider. This is important. Contact a health care provider if:  You notice gradual blurring or other changes in your vision over time.  You notice  that your glasses or contact lenses do not make things look as sharp as they once did.  You have trouble reading or seeing details at a distance with either eye.  You notice a change in your vision or notice that parts of your field of vision appear missing or hazy.  You suddenly see moving specks or dark spots in the field of vision of either eye. Get help right away if:  You have sudden pain or pressure in one or both eyes.  You suddenly lose vision or a curtain or veil seems to come across your eyes.  You have a sudden burst of floaters in your vision. Summary  Diabetic retinopathy is a disease of the retina. The retina is a light-sensitive membrane at the back of the eye. Retinopathy is a complication of diabetes.  Get your eyes checked at least once every year. An eye specialist can usually see diabetic retinopathy developing long before it starts to cause problems. In many cases, it can be treated to prevent complications from occurring.  Keep your blood glucose and your blood pressure in target range. Follow your diabetes management plan as directed by your health care provider.  Protect your eyes. Wear sunglasses and eye protection when needed. This information is not intended to replace advice given to you by your health care provider. Make  sure you discuss any questions you have with your health care provider. Document Revised: 03/25/2017 Document Reviewed: 03/17/2016 Elsevier Patient Education  2020 [employer].     Explained the diagnoses, plan, and follow up with the patient and they expressed understanding.  Patient expressed understanding of the importance of proper follow up care.   Clent Demark Rankin M.D. Diseases & Surgery of the Retina and Vitreous [facility]ina [facility] Diabetic [place] 07/11/19     Abbreviations: M myopia (nearsighted); A astigmatism; H hyperopia (farsighted); P presbyopia; Mrx spectacle prescription;  CTL contact lenses; OD right eye; OS left eye; OU  both eyes  XT exotropia; ET esotropia; PEK punctate epithelial keratitis; PEE punctate epithelial erosions; DES dry eye syndrome; MGD meibomian gland dysfunction; ATs artificial tears; PFAT's preservative free artificial tears; [place]ma nuclear sclerotic cataract; PSC posterior subcapsular cataract; ERM epi-retinal membrane; PVD posterior vitreous detachment; RD retinal detachment; DM diabetes mellitus; DR diabetic retinopathy; NPDR non-proliferative diabetic retinopathy; PDR proliferative diabetic retinopathy; CSME clinically significant macular edema; DME diabetic macular edema; dbh dot blot hemorrhages; CWS cotton wool spot; POAG primary open angle glaucoma; C/D cup-to-disc ratio; HVF humphrey visual field; GVF goldmann visual field; OCT optical coherence tomography; IOP intraocular pressure; BRVO Branch retinal vein occlusion; CRVO central retinal vein occlusion; CRAO central retinal artery occlusion; BRAO branch retinal artery occlusion; RT retinal tear; SB scleral buckle; PPV pars plana vitrectomy; VH Vitreous hemorrhage; PRP panretinal laser photocoagulation; IVK intravitreal kenalog; VMT vitreomacular traction; MH Macular hole;  NVD neovascularization of the disc; NVE neovascularization elsewhere; AREDS age related eye disease study; ARMD age related macular degeneration; POAG primary open angle glaucoma; EBMD epithelial/anterior basement membrane dystrophy; ACIOL anterior chamber intraocular lens; IOL intraocular lens; PCIOL posterior chamber intraocular lens; Phaco/IOL phacoemulsification with intraocular lens placement; [place] photorefractive keratectomy; LASIK laser assisted in situ keratomileusis; HTN hypertension; DM diabetes mellitus; COPD chronic obstructive pulmonary disease

## 2019-07-13 DIAGNOSIS — M7661 Achilles tendinitis, right leg: Secondary | ICD-10-CM | POA: Diagnosis not present

## 2019-07-18 DIAGNOSIS — M7661 Achilles tendinitis, right leg: Secondary | ICD-10-CM | POA: Diagnosis not present

## 2019-07-20 DIAGNOSIS — M7661 Achilles tendinitis, right leg: Secondary | ICD-10-CM | POA: Diagnosis not present

## 2019-07-22 DIAGNOSIS — M7661 Achilles tendinitis, right leg: Secondary | ICD-10-CM | POA: Diagnosis not present

## 2019-07-25 ENCOUNTER — Encounter (HOSPITAL_COMMUNITY): Payer: Self-pay | Admitting: Emergency Medicine

## 2019-07-25 ENCOUNTER — Emergency Department (HOSPITAL_COMMUNITY)
Admission: EM | Admit: 2019-07-25 | Discharge: 2019-07-25 | Disposition: A | Discharge status: Home / Self Care | Payer: Medicare Other | Attending: Emergency Medicine | Admitting: Emergency Medicine

## 2019-07-25 ENCOUNTER — Emergency Department (HOSPITAL_COMMUNITY): Payer: Medicare Other

## 2019-07-25 DIAGNOSIS — S0081XA Abrasion of other part of head, initial encounter: Secondary | ICD-10-CM | POA: Insufficient documentation

## 2019-07-25 DIAGNOSIS — S0990XA Unspecified injury of head, initial encounter: Secondary | ICD-10-CM | POA: Diagnosis present

## 2019-07-25 DIAGNOSIS — Z79899 Other long term (current) drug therapy: Secondary | ICD-10-CM | POA: Insufficient documentation

## 2019-07-25 DIAGNOSIS — I10 Essential (primary) hypertension: Secondary | ICD-10-CM | POA: Insufficient documentation

## 2019-07-25 DIAGNOSIS — Y929 Unspecified place or not applicable: Secondary | ICD-10-CM | POA: Diagnosis not present

## 2019-07-25 DIAGNOSIS — E119 Type 2 diabetes mellitus without complications: Secondary | ICD-10-CM | POA: Diagnosis not present

## 2019-07-25 DIAGNOSIS — S40011A Contusion of right shoulder, initial encounter: Secondary | ICD-10-CM | POA: Diagnosis not present

## 2019-07-25 DIAGNOSIS — R52 Pain, unspecified: Secondary | ICD-10-CM | POA: Diagnosis not present

## 2019-07-25 DIAGNOSIS — T07XXXA Unspecified multiple injuries, initial encounter: Secondary | ICD-10-CM

## 2019-07-25 DIAGNOSIS — E1165 Type 2 diabetes mellitus with hyperglycemia: Secondary | ICD-10-CM | POA: Diagnosis not present

## 2019-07-25 DIAGNOSIS — Y939 Activity, unspecified: Secondary | ICD-10-CM | POA: Insufficient documentation

## 2019-07-25 DIAGNOSIS — Z794 Long term (current) use of insulin: Secondary | ICD-10-CM | POA: Insufficient documentation

## 2019-07-25 DIAGNOSIS — Y999 Unspecified external cause status: Secondary | ICD-10-CM | POA: Diagnosis not present

## 2019-07-25 DIAGNOSIS — W109XXA Fall (on) (from) unspecified stairs and steps, initial encounter: Secondary | ICD-10-CM | POA: Diagnosis not present

## 2019-07-25 DIAGNOSIS — W19XXXA Unspecified fall, initial encounter: Secondary | ICD-10-CM | POA: Diagnosis not present

## 2019-07-25 MED ORDER — HYDROMORPHONE HCL 1 MG/ML IJ SOLN
1.0000 mg | Freq: Once | INTRAMUSCULAR | Status: AC
  Administered 2019-07-25: 1 mg via INTRAMUSCULAR
  Filled 2019-07-25: qty 1

## 2019-07-25 MED ORDER — HYDROCODONE-ACETAMINOPHEN 5-325 MG PO TABS: 1.0000 | ORAL_TABLET | Freq: Four times a day (QID) | ORAL | 0 refills | Status: DC | PRN

## 2019-07-25 MED ORDER — LORAZEPAM 1 MG PO TABS
1.0000 mg | ORAL_TABLET | Freq: Once | ORAL | Status: AC
  Administered 2019-07-25: 1 mg via ORAL
  Filled 2019-07-25: qty 1

## 2019-07-25 NOTE — ED Notes (Signed)
Pt verbalized understanding of d/c instructions, follow up care and s/s requiring return to ed. Pt transported via wheelchair to exit.

## 2019-07-25 NOTE — ED Notes (Signed)
Pt to xr 

## 2019-07-25 NOTE — Discharge Instructions (Addendum)
Your x-rays do not show any significant abnormalities. The sling is for comfort purposes. Wear it if you think it helps. Unless you have been specifically told not to, I would take 400 mg of ibuprofen every 6 hours as needed for pain. Take prescribed pain medication as needed if you feel like you need something additional.  I expect you to be very sore for close to a week. If you feel like your pain is not starting to get better then I want you to be re-checked. Your PCP, sports medicine or orthopedics are all appropriate.

## 2019-07-25 NOTE — ED Notes (Signed)
Strong radial pulse present in right arm.  Pt c/o pain to right shoulder

## 2019-07-25 NOTE — ED Triage Notes (Addendum)
Patient was walking down the carpeted concrete steps [place] her [employer] when she tripped and fell head-over-heels down 9 steps. No LOC, no anticoagulants. Patient has history of hypertension, BP on scene with EMS was 260/140, BP in triage 211/104. Patient reports being compliant with BP medications. Complains of right shoulder pain. Patient alert and oriented and in no apparent distress at this time.

## 2019-07-26 NOTE — ED Provider Notes (Signed)
[facility] Provider Note   CSN: [id number] Arrival date & time: 07/25/19  1252     History Chief Complaint  Patient presents with  . Taylors is a 69 y.o. female.  HPI   69 year old female presenting after fall.  Tripped and fell down a carpeted set of concrete steps.  Approximately 9.  She did strike her head.  No loss of consciousness.  Denies any significant headaches.  Is complaining pain primarily in her right shoulder region.  Worse with movement.  She is not anticoagulated.  Past Medical History:  Diagnosis Date  . Diabetes mellitus ([place])   . Diverticula of colon   . External hemorrhoid   . Hypertension   . Internal hemorrhoid   . Kidney stones   . Liver nodule   . Nodule of kidney     Patient Active Problem List   Diagnosis Date Noted  . Severe nonproliferative diabetic retinopathy of right eye, with macular edema, associated with type 2 diabetes mellitus ([place]) 06/02/2019  . Severe nonproliferative diabetic retinopathy of left eye, with macular edema, associated with type 2 diabetes mellitus ([place]) 06/02/2019  . Retinal exudates and deposits 06/02/2019  . Pseudophakia 06/02/2019  . Hypertensive retinopathy of right eye 06/02/2019  . Snoring 06/02/2019    Past Surgical History:  Procedure Laterality Date  . ABDOMINAL HYSTERECTOMY  1991  . BREAST REDUCTION SURGERY  1985  . HAND SURGERY  2001   left, pins  . LASIK  2005   bilateral  . LITHOTRIPSY  1983     OB History   No obstetric history on file.     Family History  Problem Relation Age of Onset  . Breast cancer Mother   . Diabetes Father   . Prostate cancer Maternal Uncle   . Stroke Maternal Grandmother   . Heart attack Maternal Grandfather   . Diabetes Brother     Social History   Tobacco Use  . Smoking status: Never Smoker  . Smokeless tobacco: Never Used  Substance Use Topics  . Alcohol use: No  . Drug use: No    Home  Medications Prior to Admission medications   Medication Sig Start Date End Date Taking? Authorizing Provider  Albiglutide (TANZEUM) 50 MG PEN Inject into the skin.    [provider]  Alogliptin Benzoate (NESINA) 25 MG TABS Take by mouth.    [provider]  atorvastatin (LIPITOR) 40 MG tablet Take 40 mg by mouth daily.    [provider]  Calcium-Vitamin D-Vitamin K (VIACTIV PO) Take 1 tablet by mouth daily.    [provider]  Canagliflozin (INVOKANA) 300 MG TABS Take 300 mg by mouth daily. 09/25/11   Milus Banister, MD  Certolizumab Pegol (CIMZIA) 2 X 200 MG KIT Inject 1 mL into the skin.    [provider]  diltiazem (CARDIZEM) 120 MG tablet Take by mouth.    [provider]  empagliflozin (JARDIANCE) 25 MG TABS tablet Take 25 mg by mouth daily.    [provider]  HYDROcodone-acetaminophen (NORCO/VICODIN) 5-325 MG tablet Take 1 tablet by mouth every 6 (six) hours as needed. 07/25/19   Virgel Manifold, MD  InFLIXimab (REMICADE IV) Inject into the vein.    [provider]  insulin aspart protamine - aspart (NOVOLOG MIX 70/30 FLEXPEN) (70-30) 100 UNIT/ML FlexPen inject 25 units before breakfast and 10 units before dinner subcutaneously 11/07/16   [provider]  meloxicam (MOBIC) 15 MG tablet  08/27/12   [provider]  Multiple Vitamin (MULTIVITAMIN) tablet Take 1 tablet by mouth daily.    [provider]  pioglitazone (ACTOS) 15 MG tablet Take 15 mg by mouth daily.    [provider]  ramipril (ALTACE) 5 MG capsule Take 5 mg by mouth daily.    [provider]  traMADol (ULTRAM) 50 MG tablet Take 1 tablet (50 mg total) by mouth every 6 (six) hours as needed. Patient not taking: Reported on 06/02/2019 07/21/15   Orlie Dakin, MD    Allergies    Darvocet [propoxyphene n-acetaminophen], Gadolinium derivatives, Methotrexate derivatives, and Percodan [oxycodone-aspirin]  Review of  Systems   Review of Systems All systems reviewed and negative, other than as noted in HPI.  Physical Exam Updated Vital Signs BP (!) 167/99 (BP Location: Right Arm)   Pulse 94   Temp 98.1 F (36.7 C) (Oral)   Resp 20   Ht 4' 9"  (1.448 m)   Wt 72.6 kg   SpO2 98%   BMI 34.62 kg/m   Physical Exam Vitals and nursing note reviewed.  Constitutional:      General: She is not in acute distress.    Appearance: She is well-developed.  HENT:     Head: Normocephalic and atraumatic.  Eyes:     General:        Right eye: No discharge.        Left eye: No discharge.     Conjunctiva/sclera: Conjunctivae normal.  Cardiovascular:     Rate and Rhythm: Normal rate and regular rhythm.     Heart sounds: Normal heart sounds. No murmur. No friction rub. No gallop.   Pulmonary:     Effort: Pulmonary effort is normal. No respiratory distress.     Breath sounds: Normal breath sounds.  Abdominal:     General: There is no distension.     Palpations: Abdomen is soft.     Tenderness: There is no abdominal tenderness.  Musculoskeletal:        General: Tenderness present.     Cervical back: Neck supple.     Comments: Tenderness to palpation of the right anterior shoulder and proximal humerus.  Increased pain with range of motion.  No midline spinal tenderness.  Neurovascularly intact.  Small superficial abrasion to right forehead.  Skin:    General: Skin is warm and dry.  Neurological:     Mental Status: She is alert and oriented to person, place, and time.     Cranial Nerves: No cranial nerve deficit.     Sensory: No sensory deficit.     Motor: No weakness.     Coordination: Coordination normal.  Psychiatric:        Behavior: Behavior normal.        Thought Content: Thought content normal.     ED Results / Procedures / Treatments   Labs (all labs ordered are listed, but only abnormal results are displayed) Labs Reviewed - No data to display  EKG None  Radiology DG Ribs Unilateral  W/Chest Right  Result Date: 07/25/2019 CLINICAL DATA:  69 year old female with fall and right chest wall pain. EXAM: RIGHT RIBS AND CHEST - 3+ VIEW COMPARISON:  Chest CT dated 10/05/2013 FINDINGS: No focal consolidation, pleural effusion or pneumothorax. Top-normal cardiac size. Atherosclerotic calcification of the aorta. Osteopenia. No acute osseous pathology. No displaced rib fracture. IMPRESSION: 1. No acute cardiopulmonary process. 2. No displaced rib fracture. Electronically Signed   By:  Anner Crete M.D.   On: 07/25/2019 20:54   DG Shoulder Right  Result Date: 07/25/2019 CLINICAL DATA:  Fall EXAM: RIGHT SHOULDER - 2+ VIEW COMPARISON:  None. FINDINGS: There is no evidence of fracture or dislocation. There is no evidence of arthropathy or other focal bone abnormality. Soft tissues are unremarkable. IMPRESSION: Negative. Electronically Signed   By: Ulyses Jarred M.D.   On: 07/25/2019 20:52    Procedures Procedures (including critical care time)  Medications Ordered in ED Medications  HYDROmorphone (DILAUDID) injection 1 mg (1 mg Intramuscular Given 07/25/19 2051)  LORazepam (ATIVAN) tablet 1 mg (1 mg Oral Given 07/25/19 2050)    ED Course  I have reviewed the triage vital signs and the nursing notes.  Pertinent labs & imaging results that were available during my care of the patient were reviewed by me and considered in my medical decision making (see chart for details).    MDM Rules/Calculators/A&P                      69 year old female with pain in multiple locations, but primarily right shoulder, after mechanical fall earlier today.  Nonfocal neuro exam.  Negative x-rays.  Plan continued symptomatic treatment.  Return precautions discussed.  Activity as tolerated otherwise.  Final Clinical Impression(s) / ED Diagnoses Final diagnoses:  Multiple contusions    Rx / DC Orders ED Discharge Orders         Ordered    HYDROcodone-acetaminophen (NORCO/VICODIN) 5-325 MG tablet   Every 6 hours PRN     07/25/19 2126           Virgel Manifold, MD 07/28/19 1043

## 2019-07-28 DIAGNOSIS — M7661 Achilles tendinitis, right leg: Secondary | ICD-10-CM | POA: Diagnosis not present

## 2019-08-01 DIAGNOSIS — M7661 Achilles tendinitis, right leg: Secondary | ICD-10-CM | POA: Diagnosis not present

## 2019-08-04 ENCOUNTER — Encounter (INDEPENDENT_AMBULATORY_CARE_PROVIDER_SITE_OTHER): Payer: Self-pay | Admitting: Ophthalmology

## 2019-08-04 ENCOUNTER — Ambulatory Visit (INDEPENDENT_AMBULATORY_CARE_PROVIDER_SITE_OTHER): Payer: Medicare Other | Admitting: Ophthalmology

## 2019-08-04 ENCOUNTER — Other Ambulatory Visit: Payer: Self-pay

## 2019-08-04 DIAGNOSIS — E113412 Type 2 diabetes mellitus with severe nonproliferative diabetic retinopathy with macular edema, left eye: Secondary | ICD-10-CM

## 2019-08-04 DIAGNOSIS — E113411 Type 2 diabetes mellitus with severe nonproliferative diabetic retinopathy with macular edema, right eye: Secondary | ICD-10-CM | POA: Diagnosis not present

## 2019-08-04 NOTE — Assessment & Plan Note (Signed)
Focal laser photocoagulation today for extra foveal C CSME

## 2019-08-04 NOTE — Assessment & Plan Note (Signed)
Follow-up as scheduled for repeat intravitreal Avastin

## 2019-08-04 NOTE — Progress Notes (Signed)
08/04/2019     CHIEF COMPLAINT Patient presents for Retina Follow Up   HISTORY OF PRESENT ILLNESS: Shannon Craig is a 69 y.o. female who presents to the clinic today for:   HPI    Retina Follow Up    Patient presents with  Diabetic Retinopathy.  In right eye.  Duration of 3 weeks.  Since onset it is gradually worsening.          Comments    3 week follow up - Poss Focal OD Patient states that she took a bad fall on Memorial day down a flight of steps and she hit her head. Patient states that she has noticed a bit more cloudiness in both eyes.         Last edited by Gerda Diss on 08/04/2019 10:55 AM. (History)      Referring physician: Merrilee Seashore, MD [address] [place],  [place] [address]  HISTORICAL INFORMATION:   Selected notes from the [facility]: No current outpatient medications on file. (Ophthalmic Drugs)   No current facility-administered medications for this visit. (Ophthalmic Drugs)   Current Outpatient Medications (Other)  Medication Sig   Albiglutide (TANZEUM) 50 MG PEN Inject into the skin.   Alogliptin Benzoate (NESINA) 25 MG TABS Take by mouth.   atorvastatin (LIPITOR) 40 MG tablet Take 40 mg by mouth daily.   Calcium-Vitamin D-Vitamin K (VIACTIV PO) Take 1 tablet by mouth daily.   Canagliflozin (INVOKANA) 300 MG TABS Take 300 mg by mouth daily.   Certolizumab Pegol (CIMZIA) 2 X 200 MG KIT Inject 1 mL into the skin.   diltiazem (CARDIZEM) 120 MG tablet Take by mouth.   empagliflozin (JARDIANCE) 25 MG TABS tablet Take 25 mg by mouth daily.   HYDROcodone-acetaminophen (NORCO/VICODIN) 5-325 MG tablet Take 1 tablet by mouth every 6 (six) hours as needed.   InFLIXimab (REMICADE IV) Inject into the vein.   insulin aspart protamine - aspart (NOVOLOG MIX 70/30 FLEXPEN) (70-30) 100 UNIT/ML FlexPen inject 25 units before breakfast and 10 units before dinner subcutaneously   meloxicam  (MOBIC) 15 MG tablet    Multiple Vitamin (MULTIVITAMIN) tablet Take 1 tablet by mouth daily.   pioglitazone (ACTOS) 15 MG tablet Take 15 mg by mouth daily.   ramipril (ALTACE) 5 MG capsule Take 5 mg by mouth daily.   traMADol (ULTRAM) 50 MG tablet Take 1 tablet (50 mg total) by mouth every 6 (six) hours as needed. (Patient not taking: Reported on 06/02/2019)   No current facility-administered medications for this visit. (Other)      REVIEW OF SYSTEMS:    ALLERGIES Allergies  Allergen Reactions   Darvocet [Propoxyphene N-Acetaminophen]    Gadolinium Derivatives Hives   Methotrexate Derivatives    Percodan [Oxycodone-Aspirin]     PAST MEDICAL HISTORY Past Medical History:  Diagnosis Date   Diabetes mellitus ([place])    Diverticula of colon    External hemorrhoid    Hypertension    Internal hemorrhoid    Kidney stones    Liver nodule    Nodule of kidney    Past Surgical History:  Procedure Laterality Date   [place]   HAND SURGERY  2001   left, pins   LASIK  2005   bilateral   LITHOTRIPSY  1983    FAMILY HISTORY Family History  Problem Relation Age of Onset   Breast  cancer Mother    Diabetes Father    Prostate cancer Maternal Uncle    Stroke Maternal Grandmother    Heart attack Maternal Grandfather    Diabetes Brother     SOCIAL HISTORY Social History   Tobacco Use   Smoking status: Never Smoker   Smokeless tobacco: Never Used  Substance Use Topics   Alcohol use: No   Drug use: No         OPHTHALMIC EXAM:  Base Eye Exam    Visual Acuity (Snellen - Linear)      Right Left   Dist [place] 20/20 20/25-2       Tonometry (Tonopen, 10:45 AM)      Right Left   Pressure 15 16       Pupils      Pupils Dark Light Shape React APD   Right PERRL 3 2 Round Brisk None   Left PERRL 3 2 Round Brisk None       Visual Fields (Counting fingers)      Left Right    Full Full        Extraocular Movement      Right Left    Full Full       Neuro/Psych    Oriented x3: Yes   Mood/Affect: Normal       Dilation    Both eyes: 1.0% Mydriacyl, 2.5% Phenylephrine @ 10:45 AM        Slit Lamp and Fundus Exam    External Exam      Right Left   External Normal Normal       Slit Lamp Exam      Right Left   Lids/Lashes Normal Normal   Conjunctiva/Sclera White and quiet White and quiet   Cornea Clear Clear   Anterior Chamber Deep and quiet Deep and quiet   Iris Round and reactive Round and reactive   Lens Posterior chamber intraocular lens Posterior chamber intraocular lens   Vitreous Normal Normal          IMAGING AND PROCEDURES  Imaging and Procedures for 08/04/19  OCT, Retina - OU - Both Eyes       Right Eye Quality was good. Scan locations included subfoveal. Central Foveal Thickness: 246.   Left Eye Quality was good. Scan locations included subfoveal. Central Foveal Thickness: 222.   Notes CSME OS improved dramatically post Avastin   OD, extra foveal CSME will deliver focal laser treatment OD today       Focal Laser - OD - Right Eye       Time Out Confirmed correct patient, procedure, site, and patient consented.   Anesthesia Topical anesthesia was used. Anesthetic medications included Proparacaine 0.5%.   Laser Information The type of laser was diode. Color was yellow. The duration in seconds was 0.05. The spot size was 100 microns. Laser power was 80. Total spots was 71.   Post-op The patient tolerated the procedure well. There were no complications. The patient received written and verbal post procedure care education.                 ASSESSMENT/PLAN:  Severe nonproliferative diabetic retinopathy of right eye, with macular edema, associated with type 2 diabetes mellitus (HCC) Focal laser photocoagulation today for extra foveal C CSME  Severe nonproliferative diabetic retinopathy of left eye, with macular edema,  associated with type 2 diabetes mellitus ([place]) Follow-up as scheduled for repeat intravitreal Avastin      ICD-10-CM   1. Severe  nonproliferative diabetic retinopathy of right eye, with macular edema, associated with type 2 diabetes mellitus (HCC)  E11.3411 OCT, Retina - OU - Both Eyes    Focal Laser - OD - Right Eye  2. Severe nonproliferative diabetic retinopathy of left eye, with macular edema, associated with type 2 diabetes mellitus ([place])  V03.5009     1.  Focal laser OD delivered today for CSME extra foveal.  2.  OS return for follow-up visit as scheduled for diabetics clinically significant MAC edema much improved after recent intravitreal Avastin  3.  Ophthalmic Meds Ordered this visit:  No orders of the defined types were placed in this encounter.      No follow-ups on file.  There are no Patient Instructions on file for this visit.   Explained the diagnoses, plan, and follow up with the patient and they expressed understanding.  Patient expressed understanding of the importance of proper follow up care.   Clent Demark Lissette Schenk M.D. Diseases & Surgery of the Retina and Vitreous Retina & Diabetic [place] 08/04/19     Abbreviations: M myopia (nearsighted); A astigmatism; H hyperopia (farsighted); P presbyopia; Mrx spectacle prescription;  CTL contact lenses; OD right eye; OS left eye; OU both eyes  XT exotropia; ET esotropia; PEK punctate epithelial keratitis; PEE punctate epithelial erosions; DES dry eye syndrome; MGD meibomian gland dysfunction; ATs artificial tears; PFAT's preservative free artificial tears; [place] nuclear sclerotic cataract; PSC posterior subcapsular cataract; ERM epi-retinal membrane; PVD posterior vitreous detachment; RD retinal detachment; DM diabetes mellitus; DR diabetic retinopathy; NPDR non-proliferative diabetic retinopathy; PDR proliferative diabetic retinopathy; CSME clinically significant macular edema; DME diabetic macular edema; dbh dot blot  hemorrhages; CWS cotton wool spot; POAG primary open angle glaucoma; C/D cup-to-disc ratio; HVF humphrey visual field; GVF goldmann visual field; OCT optical coherence tomography; IOP intraocular pressure; BRVO Branch retinal vein occlusion; CRVO central retinal vein occlusion; CRAO central retinal artery occlusion; BRAO branch retinal artery occlusion; RT retinal tear; SB scleral buckle; PPV pars plana vitrectomy; VH Vitreous hemorrhage; PRP panretinal laser photocoagulation; IVK intravitreal kenalog; VMT vitreomacular traction; MH Macular hole;  NVD neovascularization of the disc; NVE neovascularization elsewhere; AREDS age related eye disease study; ARMD age related macular degeneration; POAG primary open angle glaucoma; EBMD epithelial/anterior basement membrane dystrophy; ACIOL anterior chamber intraocular lens; IOL intraocular lens; PCIOL posterior chamber intraocular lens; Phaco/IOL phacoemulsification with intraocular lens placement; [place] photorefractive keratectomy; LASIK laser assisted in situ keratomileusis; HTN hypertension; DM diabetes mellitus; COPD chronic obstructive pulmonary disease

## 2019-08-05 DIAGNOSIS — L4050 Arthropathic psoriasis, unspecified: Secondary | ICD-10-CM | POA: Diagnosis not present

## 2019-08-09 ENCOUNTER — Other Ambulatory Visit: Payer: Self-pay

## 2019-08-09 ENCOUNTER — Encounter (INDEPENDENT_AMBULATORY_CARE_PROVIDER_SITE_OTHER): Payer: Self-pay | Admitting: Ophthalmology

## 2019-08-09 ENCOUNTER — Ambulatory Visit (INDEPENDENT_AMBULATORY_CARE_PROVIDER_SITE_OTHER): Payer: Medicare Other | Admitting: Ophthalmology

## 2019-08-09 DIAGNOSIS — E113412 Type 2 diabetes mellitus with severe nonproliferative diabetic retinopathy with macular edema, left eye: Secondary | ICD-10-CM | POA: Diagnosis not present

## 2019-08-09 NOTE — Assessment & Plan Note (Signed)
Focal laser photocoagulation superior to the foveal avascular zone today.  No complication

## 2019-08-09 NOTE — Progress Notes (Signed)
08/09/2019     CHIEF COMPLAINT Patient presents for Retina Follow Up   HISTORY OF PRESENT ILLNESS: Shannon Craig is a 69 y.o. female who presents to the clinic today for:   HPI    Retina Follow Up    Patient presents with  Diabetic Retinopathy.  In both eyes.  This started 1 week ago.  Severity is mild.  Duration of 1 week.  Since onset it is stable.          Comments    1 Week FFA/FP L/R, Focal OS  Pt denies noticeable changes to [place] OU since last visit. Pt denies ocular pain, flashes of light, or floaters OU.  LBS: 135 this AM       Last edited by Rockie Neighbours, [place] on 08/09/2019  8:11 AM. (History)      Referring physician: Merrilee Seashore, MD [address] [place],  [place] [address]  HISTORICAL INFORMATION:   Selected notes from the [facility]: No current outpatient medications on file. (Ophthalmic Drugs)   No current facility-administered medications for this visit. (Ophthalmic Drugs)   Current Outpatient Medications (Other)  Medication Sig  . Albiglutide (TANZEUM) 50 MG PEN Inject into the skin.  . Alogliptin Benzoate (NESINA) 25 MG TABS Take by mouth.  Marland Kitchen atorvastatin (LIPITOR) 40 MG tablet Take 40 mg by mouth daily.  . Calcium-Vitamin D-Vitamin K (VIACTIV PO) Take 1 tablet by mouth daily.  . Canagliflozin (INVOKANA) 300 MG TABS Take 300 mg by mouth daily.  . Certolizumab Pegol (CIMZIA) 2 X 200 MG KIT Inject 1 mL into the skin.  Marland Kitchen diltiazem (CARDIZEM) 120 MG tablet Take by mouth.  . empagliflozin (JARDIANCE) 25 MG TABS tablet Take 25 mg by mouth daily.  Marland Kitchen HYDROcodone-acetaminophen (NORCO/VICODIN) 5-325 MG tablet Take 1 tablet by mouth every 6 (six) hours as needed.  . InFLIXimab (REMICADE IV) Inject into the vein.  Marland Kitchen insulin aspart protamine - aspart (NOVOLOG MIX 70/30 FLEXPEN) (70-30) 100 UNIT/ML FlexPen inject 25 units before breakfast and 10 units before dinner subcutaneously  . meloxicam (MOBIC) 15  MG tablet   . Multiple Vitamin (MULTIVITAMIN) tablet Take 1 tablet by mouth daily.  . pioglitazone (ACTOS) 15 MG tablet Take 15 mg by mouth daily.  . ramipril (ALTACE) 5 MG capsule Take 5 mg by mouth daily.  . traMADol (ULTRAM) 50 MG tablet Take 1 tablet (50 mg total) by mouth every 6 (six) hours as needed. (Patient not taking: Reported on 06/02/2019)   No current facility-administered medications for this visit. (Other)      REVIEW OF SYSTEMS:    ALLERGIES Allergies  Allergen Reactions  . Darvocet [Propoxyphene N-Acetaminophen]   . Gadolinium Derivatives Hives  . Methotrexate Derivatives   . Percodan [Oxycodone-Aspirin]     PAST MEDICAL HISTORY Past Medical History:  Diagnosis Date  . Diabetes mellitus ([place])   . Diverticula of colon   . External hemorrhoid   . Hypertension   . Internal hemorrhoid   . Kidney stones   . Liver nodule   . Nodule of kidney    Past Surgical History:  Procedure Laterality Date  . ABDOMINAL HYSTERECTOMY  1991  . BREAST REDUCTION SURGERY  1985  . HAND SURGERY  2001   left, pins  . LASIK  2005   bilateral  . LITHOTRIPSY  1983    FAMILY HISTORY Family History  Problem Relation Age of Onset  . Breast cancer  Mother   . Diabetes Father   . Prostate cancer Maternal Uncle   . Stroke Maternal Grandmother   . Heart attack Maternal Grandfather   . Diabetes Brother     SOCIAL HISTORY Social History   Tobacco Use  . Smoking status: Never Smoker  . Smokeless tobacco: Never Used  Substance Use Topics  . Alcohol use: No  . Drug use: No         OPHTHALMIC EXAM:  Base Eye Exam    Visual Acuity (ETDRS)      Right Left   Dist  20/20 -2 20/25 +2       Tonometry (Tonopen, 8:14 AM)      Right Left   Pressure 14 11       Pupils      Pupils Dark Light Shape React APD   Right PERRL 4 3 Round Brisk None   Left PERRL 4 3 Round Brisk None       Visual Fields (Counting fingers)      Left Right    Full Full       Extraocular  Movement      Right Left    Full Full       Neuro/Psych    Oriented x3: Yes   Mood/Affect: Normal       Dilation    Both eyes: 1.0% Mydriacyl, 2.5% Phenylephrine @ 8:14 AM        Slit Lamp and Fundus Exam    External Exam      Right Left   External Normal Normal       Slit Lamp Exam      Right Left   Lids/Lashes Normal Normal   Conjunctiva/Sclera White and quiet White and quiet   Cornea Clear Clear   Anterior Chamber Deep and quiet Deep and quiet   Iris Round and reactive Round and reactive   Lens Posterior chamber intraocular lens Posterior chamber intraocular lens   Anterior Vitreous Normal Normal       Fundus Exam      Right Left   Posterior Vitreous  Normal   Disc  Normal   C/D Ratio  0.5-6   Macula  Macular thickening, focal clinically significant macular edema, superior to the FAZ region, exudates, Microaneurysms   Vessels  Retinopathy, severe nonproliferative diabetic retinopathy   Periphery  Peripheral retinal nonperfusion          IMAGING AND PROCEDURES  Imaging and Procedures for 08/09/19  Fluorescein Angiography Optos (Transit OS)       Right Eye   Progression has been stable. Early phase findings include leakage. Mid/Late phase findings include leakage, microaneurysm. Choroidal neovascularization is not present.   Left Eye   Progression has been stable. Early phase findings include leakage. Mid/Late phase findings include microaneurysm. Choroidal neovascularization is not present.   Notes Severe nonproliferative diabetic retinopathy with extensive peripheral nonperfusion.  Regions needing therapy peripherally will be anterior nasally as well as full quadrant treatment superiorly OS  OD with no large regions of capillary dropout peripherally at this point with good nasal PRP will carefully monitor the region superior and inferior temporal arcades of capillary dropout but no encroaching CSME at this point       Color Fundus Photography  Optos - OU - Both Eyes       Right Eye Progression has been stable. Disc findings include normal observations. Macula : microaneurysms, exudates.   Left Eye Progression has worsened. Disc findings include normal observations.  Macula : microaneurysms, exudates.   Notes OD with severe nonproliferative diabetic retinopathy, status post moderate scatter panel photocoagulation and focal laser treatment.  OS with similar findings with severe NPDR status post focal laser treatment inferiorly.  Will need focal laser treatment OS today superior to the fovea.       Focal Laser - OS - Left Eye       Time Out Confirmed correct patient, procedure, site, and patient consented.   Anesthesia Topical anesthesia was used. Anesthetic medications included Proparacaine 0.5%.   Laser Information The type of laser was diode. Color was yellow. The duration in seconds was 0.02. The spot size was 100 microns. Laser power was 80. Total spots was 132.   Post-op The patient tolerated the procedure well. There were no complications.   Notes Focal laser photocoagulation delivered today in a grid like fashion superior to the fovea.                ASSESSMENT/PLAN:  Severe nonproliferative diabetic retinopathy of left eye, with macular edema, associated with type 2 diabetes mellitus ([place]) Focal laser photocoagulation superior to the foveal avascular zone today.  No complication      [id number]   1. Severe nonproliferative diabetic retinopathy of left eye, with macular edema, associated with type 2 diabetes mellitus (HCC)  T73.2202 Fluorescein Angiography Optos (Transit OS)    Color Fundus Photography Optos - OU - Both Eyes    Focal Laser - OS - Left Eye    1.  2.  3.  Ophthalmic Meds Ordered this visit:  No orders of the defined types were placed in this encounter.      Return in about 4 months (around 12/09/2019) for DILATE OU, OCT.  There are no Patient Instructions on file for  this visit.   Explained the diagnoses, plan, and follow up with the patient and they expressed understanding.  Patient expressed understanding of the importance of proper follow up care.   Clent Demark Burman Bruington M.D. Diseases & Surgery of the Retina and Vitreous Retina & Diabetic [place] 08/09/19     Abbreviations: M myopia (nearsighted); A astigmatism; H hyperopia (farsighted); P presbyopia; Mrx spectacle prescription;  CTL contact lenses; OD right eye; OS left eye; OU both eyes  XT exotropia; ET esotropia; PEK punctate epithelial keratitis; PEE punctate epithelial erosions; DES dry eye syndrome; MGD meibomian gland dysfunction; ATs artificial tears; PFAT's preservative free artificial tears; [place] nuclear sclerotic cataract; PSC posterior subcapsular cataract; ERM epi-retinal membrane; PVD posterior vitreous detachment; RD retinal detachment; DM diabetes mellitus; DR diabetic retinopathy; NPDR non-proliferative diabetic retinopathy; PDR proliferative diabetic retinopathy; CSME clinically significant macular edema; DME diabetic macular edema; dbh dot blot hemorrhages; CWS cotton wool spot; POAG primary open angle glaucoma; C/D cup-to-disc ratio; HVF humphrey visual field; GVF goldmann visual field; OCT optical coherence tomography; IOP intraocular pressure; BRVO Branch retinal vein occlusion; CRVO central retinal vein occlusion; CRAO central retinal artery occlusion; BRAO branch retinal artery occlusion; RT retinal tear; SB scleral buckle; PPV pars plana vitrectomy; VH Vitreous hemorrhage; PRP panretinal laser photocoagulation; IVK intravitreal kenalog; VMT vitreomacular traction; MH Macular hole;  NVD neovascularization of the disc; NVE neovascularization elsewhere; AREDS age related eye disease study; ARMD age related macular degeneration; POAG primary open angle glaucoma; EBMD epithelial/anterior basement membrane dystrophy; ACIOL anterior chamber intraocular lens; IOL intraocular lens; PCIOL posterior  chamber intraocular lens; Phaco/IOL phacoemulsification with intraocular lens placement; [place] photorefractive keratectomy; LASIK laser assisted in situ keratomileusis; HTN hypertension; DM diabetes mellitus; COPD  chronic obstructive pulmonary disease

## 2019-08-10 DIAGNOSIS — M7661 Achilles tendinitis, right leg: Secondary | ICD-10-CM | POA: Diagnosis not present

## 2019-08-11 DIAGNOSIS — N390 Urinary tract infection, site not specified: Secondary | ICD-10-CM | POA: Diagnosis not present

## 2019-08-11 DIAGNOSIS — E782 Mixed hyperlipidemia: Secondary | ICD-10-CM | POA: Diagnosis not present

## 2019-08-11 DIAGNOSIS — Z79899 Other long term (current) drug therapy: Secondary | ICD-10-CM | POA: Diagnosis not present

## 2019-08-11 DIAGNOSIS — E1169 Type 2 diabetes mellitus with other specified complication: Secondary | ICD-10-CM | POA: Diagnosis not present

## 2019-08-11 DIAGNOSIS — M310 Hypersensitivity angiitis: Secondary | ICD-10-CM | POA: Diagnosis not present

## 2019-08-11 DIAGNOSIS — I10 Essential (primary) hypertension: Secondary | ICD-10-CM | POA: Diagnosis not present

## 2019-08-11 DIAGNOSIS — E1140 Type 2 diabetes mellitus with diabetic neuropathy, unspecified: Secondary | ICD-10-CM | POA: Diagnosis not present

## 2019-09-07 DIAGNOSIS — M810 Age-related osteoporosis without current pathological fracture: Secondary | ICD-10-CM | POA: Diagnosis not present

## 2019-09-08 DIAGNOSIS — L4050 Arthropathic psoriasis, unspecified: Secondary | ICD-10-CM | POA: Diagnosis not present

## 2019-09-29 DIAGNOSIS — E7800 Pure hypercholesterolemia, unspecified: Secondary | ICD-10-CM | POA: Diagnosis not present

## 2019-09-29 DIAGNOSIS — I10 Essential (primary) hypertension: Secondary | ICD-10-CM | POA: Diagnosis not present

## 2019-09-29 DIAGNOSIS — E118 Type 2 diabetes mellitus with unspecified complications: Secondary | ICD-10-CM | POA: Diagnosis not present

## 2019-09-29 DIAGNOSIS — Z9641 Presence of insulin pump (external) (internal): Secondary | ICD-10-CM | POA: Diagnosis not present

## 2019-10-06 DIAGNOSIS — L4050 Arthropathic psoriasis, unspecified: Secondary | ICD-10-CM | POA: Diagnosis not present

## 2019-10-07 DIAGNOSIS — L4050 Arthropathic psoriasis, unspecified: Secondary | ICD-10-CM | POA: Diagnosis not present

## 2019-10-12 ENCOUNTER — Encounter (INDEPENDENT_AMBULATORY_CARE_PROVIDER_SITE_OTHER): Payer: Self-pay | Admitting: Ophthalmology

## 2019-10-12 ENCOUNTER — Other Ambulatory Visit: Payer: Self-pay

## 2019-10-12 ENCOUNTER — Ambulatory Visit (INDEPENDENT_AMBULATORY_CARE_PROVIDER_SITE_OTHER): Payer: Medicare Other | Admitting: Ophthalmology

## 2019-10-12 DIAGNOSIS — E113411 Type 2 diabetes mellitus with severe nonproliferative diabetic retinopathy with macular edema, right eye: Secondary | ICD-10-CM

## 2019-10-12 DIAGNOSIS — E113412 Type 2 diabetes mellitus with severe nonproliferative diabetic retinopathy with macular edema, left eye: Secondary | ICD-10-CM

## 2019-10-12 MED ORDER — AFLIBERCEPT 2MG/0.05ML IZ SOLN FOR KALEIDOSCOPE
2.0000 mg | INTRAVITREAL | Status: AC | PRN
  Administered 2019-10-12: 2 mg via INTRAVITREAL

## 2019-10-12 NOTE — Progress Notes (Signed)
10/12/2019     CHIEF COMPLAINT Patient presents for Retina Follow Up   HISTORY OF PRESENT ILLNESS: Shannon Craig is a 69 y.o. female who presents to the clinic today for:   HPI    Retina Follow Up    Patient presents with  Diabetic Retinopathy.  In both eyes.  This started 2 months ago.  Severity is mild.  Duration of 2 months.  Since onset it is gradually worsening.          Comments    2 Month Diabetic F/U OU  Pt c/o decreased VA OU, OS>OD. Pt c/o intermittent "blue and purple strobe lights" OU when she closes her eyes. A1c: 7.2, 08/2019 LBS: 120 this AM right before lunchtime       Last edited by Rockie Neighbours, [place] on 10/12/2019  2:18 PM. (History)      Referring physician: Merrilee Seashore, [place] [place] [address] [place],  [place] [address]  HISTORICAL INFORMATION:   Selected notes from the [facility]: No current outpatient medications on file. (Ophthalmic Drugs)   No current facility-administered medications for this visit. (Ophthalmic Drugs)   Current Outpatient Medications (Other)  Medication Sig  . Albiglutide (TANZEUM) 50 MG PEN Inject into the skin.  . Alogliptin Benzoate (NESINA) 25 MG TABS Take by mouth.  Marland Kitchen atorvastatin (LIPITOR) 40 MG tablet Take 40 mg by mouth daily.  . Calcium-Vitamin D-Vitamin K (VIACTIV PO) Take 1 tablet by mouth daily.  . Canagliflozin (INVOKANA) 300 MG TABS Take 300 mg by mouth daily.  . Certolizumab Pegol (CIMZIA) 2 X 200 MG KIT Inject 1 mL into the skin.  Marland Kitchen diltiazem (CARDIZEM) 120 MG tablet Take by mouth.  . empagliflozin (JARDIANCE) 25 MG TABS tablet Take 25 mg by mouth daily.  Marland Kitchen HYDROcodone-acetaminophen (NORCO/VICODIN) 5-325 MG tablet Take 1 tablet by mouth every 6 (six) hours as needed.  . InFLIXimab (REMICADE IV) Inject into the vein.  Marland Kitchen insulin aspart protamine - aspart (NOVOLOG MIX 70/30 FLEXPEN) (70-30) 100 UNIT/ML FlexPen inject 25 units before breakfast and 10 units  before dinner subcutaneously  . meloxicam (MOBIC) 15 MG tablet   . Multiple Vitamin (MULTIVITAMIN) tablet Take 1 tablet by mouth daily.  . pioglitazone (ACTOS) 15 MG tablet Take 15 mg by mouth daily.  . ramipril (ALTACE) 5 MG capsule Take 5 mg by mouth daily.  . traMADol (ULTRAM) 50 MG tablet Take 1 tablet (50 mg total) by mouth every 6 (six) hours as needed. (Patient not taking: Reported on 06/02/2019)   No current facility-administered medications for this visit. (Other)      REVIEW OF SYSTEMS:    ALLERGIES Allergies  Allergen Reactions  . Darvocet [Propoxyphene N-Acetaminophen]   . Gadolinium Derivatives Hives  . Methotrexate Derivatives   . Percodan [Oxycodone-Aspirin]     PAST MEDICAL HISTORY Past Medical History:  Diagnosis Date  . Diabetes mellitus ([place])   . Diverticula of colon   . External hemorrhoid   . Hypertension   . Internal hemorrhoid   . Kidney stones   . Liver nodule   . Nodule of kidney    Past Surgical History:  Procedure Laterality Date  . ABDOMINAL HYSTERECTOMY  1991  . BREAST REDUCTION SURGERY  1985  . HAND SURGERY  2001   left, pins  . LASIK  2005   bilateral  . LITHOTRIPSY  1983    FAMILY HISTORY Family History  Problem Relation Age of  Onset  . Breast cancer Mother   . Diabetes Father   . Prostate cancer Maternal Uncle   . Stroke Maternal Grandmother   . Heart attack Maternal Grandfather   . Diabetes Brother     SOCIAL HISTORY Social History   Tobacco Use  . Smoking status: Never Smoker  . Smokeless tobacco: Never Used  Substance Use Topics  . Alcohol use: No  . Drug use: No         OPHTHALMIC EXAM:  Base Eye Exam    Visual Acuity (ETDRS)      Right Left   Dist [place] 20/25 +2 20/30 -1   Dist ph [place]  NI       Tonometry (Tonopen, 2:19 PM)      Right Left   Pressure 15 13       Pupils      Pupils Dark Light Shape React APD   Right PERRL 4 3 Round Brisk None   Left PERRL 4 3 Round Brisk None       Visual Fields  (Counting fingers)      Left Right    Full Full       Extraocular Movement      Right Left    Full Full       Neuro/Psych    Oriented x3: Yes   Mood/Affect: Normal       Dilation    Both eyes: 1.0% Mydriacyl, 2.5% Phenylephrine @ 2:22 PM        Slit Lamp and Fundus Exam    External Exam      Right Left   External Normal Normal       Slit Lamp Exam      Right Left   Lids/Lashes Normal Normal   Conjunctiva/Sclera White and quiet White and quiet   Cornea Clear Clear   Anterior Chamber Deep and quiet Deep and quiet   Iris Round and reactive Round and reactive   Lens Posterior chamber intraocular lens Posterior chamber intraocular lens   Anterior Vitreous Normal Normal       Fundus Exam      Right Left   Posterior Vitreous Posterior vitreous detachment Normal   Disc Normal Normal   C/D Ratio 0.6 0.5   Macula Microaneurysms Macular thickening, focal clinically significant macular edema, superior to the FAZ region, exudates, Microaneurysms, Mild clinically significant macular edema   Vessels NPDR-Severe Retinopathy, severe nonproliferative diabetic retinopathy   Periphery Peripheral retinal nonperfusion Peripheral retinal nonperfusion          IMAGING AND PROCEDURES  Imaging and Procedures for 10/12/19  OCT, Retina - OU - Both Eyes       Right Eye Quality was good. Scan locations included subfoveal. Central Foveal Thickness: 393. Progression has worsened. Findings include abnormal foveal contour.   Left Eye Quality was good. Scan locations included subfoveal. Central Foveal Thickness: 493. Progression has worsened. Findings include abnormal foveal contour.   Notes OD, CSME focally juxta foveal new, and superior  macula  OS with massive recurrence of CSME center involvement       Intravitreal Injection, Pharmacologic Agent - OS - Left Eye       Time Out 10/12/2019. 3:57 PM. Confirmed correct patient, procedure, site, and patient consented.    Anesthesia Topical anesthesia was used. Anesthetic medications included Akten 3.5%.   Procedure Preparation included 5% betadine to ocular surface, Tobramycin 0.3%. A 30 gauge needle was used.   Injection:  2 mg aflibercept (EYLEA) SOLN  NDC: A3590391, Lot: [id number]   Route: Intravitreal, Site: Left Eye, Waste: 0 mg  Post-op Post injection exam found visual acuity of at least counting fingers. The patient tolerated the procedure well. There were no complications. The patient received written and verbal post procedure care education. Post injection medications were not given.                 ASSESSMENT/PLAN:  No problem-specific Assessment & Plan notes found for this encounter.      ICD-10-CM   1. Severe nonproliferative diabetic retinopathy of left eye, with macular edema, associated with type 2 diabetes mellitus (HCC)  Z48.2707 OCT, Retina - OU - Both Eyes    Intravitreal Injection, Pharmacologic Agent - OS - Left Eye    aflibercept (EYLEA) SOLN 2 mg  2. Severe nonproliferative diabetic retinopathy of right eye, with macular edema, associated with type 2 diabetes mellitus (HCC)  E11.3411 OCT, Retina - OU - Both Eyes    1.  We will commence with intravitreal Eylea today OS for massive recurrence of CSME.  2.  Patient is scheduled for major leg surgery next week.  I explained the patient that the amount of Eylea in the system would certainly be present but not as much as intravitreal Avastin would be.  Thus we will use Eylea OS today and observe OD for now  3.  Dilate OU next in 8 or 10 weeks when patient is free to return  Ophthalmic Meds Ordered this visit:  Meds ordered this encounter  Medications  . aflibercept (EYLEA) SOLN 2 mg       Return in about 8 weeks (around 12/07/2019) for DILATE OU, OCT, EYLEA OCT, OS.  There are no Patient Instructions on file for this visit.   Explained the diagnoses, plan, and follow up with the patient and they  expressed understanding.  Patient expressed understanding of the importance of proper follow up care.   Clent Demark Myrka Sylva M.D. Diseases & Surgery of the Retina and Vitreous Retina & Diabetic [place] 10/12/19     Abbreviations: M myopia (nearsighted); A astigmatism; H hyperopia (farsighted); P presbyopia; Mrx spectacle prescription;  CTL contact lenses; OD right eye; OS left eye; OU both eyes  XT exotropia; ET esotropia; PEK punctate epithelial keratitis; PEE punctate epithelial erosions; DES dry eye syndrome; MGD meibomian gland dysfunction; ATs artificial tears; PFAT's preservative free artificial tears; [place] nuclear sclerotic cataract; PSC posterior subcapsular cataract; ERM epi-retinal membrane; PVD posterior vitreous detachment; RD retinal detachment; DM diabetes mellitus; DR diabetic retinopathy; NPDR non-proliferative diabetic retinopathy; PDR proliferative diabetic retinopathy; CSME clinically significant macular edema; DME diabetic macular edema; dbh dot blot hemorrhages; CWS cotton wool spot; POAG primary open angle glaucoma; C/D cup-to-disc ratio; HVF humphrey visual field; GVF goldmann visual field; OCT optical coherence tomography; IOP intraocular pressure; BRVO Branch retinal vein occlusion; CRVO central retinal vein occlusion; CRAO central retinal artery occlusion; BRAO branch retinal artery occlusion; RT retinal tear; SB scleral buckle; PPV pars plana vitrectomy; VH Vitreous hemorrhage; PRP panretinal laser photocoagulation; IVK intravitreal kenalog; VMT vitreomacular traction; MH Macular hole;  NVD neovascularization of the disc; NVE neovascularization elsewhere; AREDS age related eye disease study; ARMD age related macular degeneration; POAG primary open angle glaucoma; EBMD epithelial/anterior basement membrane dystrophy; ACIOL anterior chamber intraocular lens; IOL intraocular lens; PCIOL posterior chamber intraocular lens; Phaco/IOL phacoemulsification with intraocular lens placement;  [place] photorefractive keratectomy; LASIK laser assisted in situ keratomileusis; HTN hypertension; DM diabetes mellitus; COPD chronic obstructive pulmonary disease

## 2019-10-18 DIAGNOSIS — G8918 Other acute postprocedural pain: Secondary | ICD-10-CM | POA: Diagnosis not present

## 2019-10-18 DIAGNOSIS — M7731 Calcaneal spur, right foot: Secondary | ICD-10-CM | POA: Diagnosis not present

## 2019-10-18 DIAGNOSIS — M6701 Short Achilles tendon (acquired), right ankle: Secondary | ICD-10-CM | POA: Diagnosis not present

## 2019-10-18 DIAGNOSIS — M7661 Achilles tendinitis, right leg: Secondary | ICD-10-CM | POA: Diagnosis not present

## 2019-10-18 DIAGNOSIS — M25774 Osteophyte, right foot: Secondary | ICD-10-CM | POA: Diagnosis not present

## 2019-11-04 DIAGNOSIS — L4050 Arthropathic psoriasis, unspecified: Secondary | ICD-10-CM | POA: Diagnosis not present

## 2019-11-09 DIAGNOSIS — M79673 Pain in unspecified foot: Secondary | ICD-10-CM | POA: Diagnosis not present

## 2019-11-09 DIAGNOSIS — E559 Vitamin D deficiency, unspecified: Secondary | ICD-10-CM | POA: Diagnosis not present

## 2019-11-09 DIAGNOSIS — D696 Thrombocytopenia, unspecified: Secondary | ICD-10-CM | POA: Diagnosis not present

## 2019-11-09 DIAGNOSIS — L4050 Arthropathic psoriasis, unspecified: Secondary | ICD-10-CM | POA: Diagnosis not present

## 2019-11-09 DIAGNOSIS — M810 Age-related osteoporosis without current pathological fracture: Secondary | ICD-10-CM | POA: Diagnosis not present

## 2019-11-09 DIAGNOSIS — M150 Primary generalized (osteo)arthritis: Secondary | ICD-10-CM | POA: Diagnosis not present

## 2019-11-09 DIAGNOSIS — M79643 Pain in unspecified hand: Secondary | ICD-10-CM | POA: Diagnosis not present

## 2019-11-09 DIAGNOSIS — Z79899 Other long term (current) drug therapy: Secondary | ICD-10-CM | POA: Diagnosis not present

## 2019-11-09 DIAGNOSIS — M7660 Achilles tendinitis, unspecified leg: Secondary | ICD-10-CM | POA: Diagnosis not present

## 2019-11-15 ENCOUNTER — Encounter (INDEPENDENT_AMBULATORY_CARE_PROVIDER_SITE_OTHER): Payer: Medicare Other | Admitting: Ophthalmology

## 2019-12-08 DIAGNOSIS — L4050 Arthropathic psoriasis, unspecified: Secondary | ICD-10-CM | POA: Diagnosis not present

## 2019-12-12 ENCOUNTER — Encounter (INDEPENDENT_AMBULATORY_CARE_PROVIDER_SITE_OTHER): Payer: Self-pay | Admitting: Ophthalmology

## 2019-12-12 ENCOUNTER — Other Ambulatory Visit: Payer: Self-pay

## 2019-12-12 ENCOUNTER — Ambulatory Visit (INDEPENDENT_AMBULATORY_CARE_PROVIDER_SITE_OTHER): Payer: Medicare Other | Admitting: Ophthalmology

## 2019-12-12 DIAGNOSIS — E113411 Type 2 diabetes mellitus with severe nonproliferative diabetic retinopathy with macular edema, right eye: Secondary | ICD-10-CM

## 2019-12-12 DIAGNOSIS — E113412 Type 2 diabetes mellitus with severe nonproliferative diabetic retinopathy with macular edema, left eye: Secondary | ICD-10-CM | POA: Diagnosis not present

## 2019-12-12 MED ORDER — AFLIBERCEPT 2MG/0.05ML IZ SOLN FOR KALEIDOSCOPE
2.0000 mg | INTRAVITREAL | Status: AC | PRN
  Administered 2019-12-12: 2 mg via INTRAVITREAL

## 2019-12-12 NOTE — Assessment & Plan Note (Signed)
The nature of diabetic macular edema was discussed with the patient. Treatment options were outlined including medical therapy, laser & vitrectomy. The use of injectable medications reviewed, including Avastin, Lucentis, and Eylea. Periodic injections into the eye are likely to resolve diabetic macular edema (swelling in the center of vision). Initially, injections are delivered are delivered every 4-6 weeks, and the interval extended as the condition improves. On average, 8-9 injections the first year, and 5 in year 2. Improvement in the condition most often improves on medical therapy. Occasional use of focal laser is also recommended for residual macular edema (swelling). Excellent control of blood glucose and blood pressure are encouraged under the care of a primary physician or endocrinologist. Similarly, attempts to maintain serum cholesterol, low density lipoproteins, and high-density lipoproteins in a favorable range were recommended.   OS, vastly improved status post intravitreal Eylea No. 1 for CSME resistant to Avastin.  Repeat injection today and examination OU in 8 weeks

## 2019-12-12 NOTE — Patient Instructions (Signed)
Patient asked to report new onset visual acuity decline or distortions.

## 2019-12-12 NOTE — Assessment & Plan Note (Signed)
This minor condition OD, has improved over time.  We will continue to observe with excellent acuity and currently

## 2019-12-12 NOTE — Progress Notes (Signed)
12/12/2019     CHIEF COMPLAINT Patient presents for Retina Follow Up   HISTORY OF PRESENT ILLNESS: Shannon Craig is a 69 y.o. female who presents to the clinic today for:   HPI    Retina Follow Up    Patient presents with  Diabetic Retinopathy.  In both eyes.  This started 9 weeks ago.  Severity is mild.  Duration of 9 weeks.  Since onset it is stable.          Comments    9 Week Diabetic F/U OU, poss Eylea OS  Pt c/o continued cloudiness OU. Pt denies new symptoms OU. LBS: 124 this AM       Last edited by Rockie Neighbours, [place] on 12/12/2019 10:18 AM. (History)      Referring physician: Merrilee Seashore, MD [address] [place],  [place] [address]  HISTORICAL INFORMATION:   Selected notes from the [facility]: No current outpatient medications on file. (Ophthalmic Drugs)   No current facility-administered medications for this visit. (Ophthalmic Drugs)   Current Outpatient Medications (Other)  Medication Sig  . Albiglutide (TANZEUM) 50 MG PEN Inject into the skin.  . Alogliptin Benzoate (NESINA) 25 MG TABS Take by mouth.  Marland Kitchen atorvastatin (LIPITOR) 40 MG tablet Take 40 mg by mouth daily.  . Calcium-Vitamin D-Vitamin K (VIACTIV PO) Take 1 tablet by mouth daily.  . Canagliflozin (INVOKANA) 300 MG TABS Take 300 mg by mouth daily.  . Certolizumab Pegol (CIMZIA) 2 X 200 MG KIT Inject 1 mL into the skin.  Marland Kitchen diltiazem (CARDIZEM) 120 MG tablet Take by mouth.  . empagliflozin (JARDIANCE) 25 MG TABS tablet Take 25 mg by mouth daily.  Marland Kitchen HYDROcodone-acetaminophen (NORCO/VICODIN) 5-325 MG tablet Take 1 tablet by mouth every 6 (six) hours as needed.  . InFLIXimab (REMICADE IV) Inject into the vein.  Marland Kitchen insulin aspart protamine - aspart (NOVOLOG MIX 70/30 FLEXPEN) (70-30) 100 UNIT/ML FlexPen inject 25 units before breakfast and 10 units before dinner subcutaneously  . meloxicam (MOBIC) 15 MG tablet   . Multiple Vitamin  (MULTIVITAMIN) tablet Take 1 tablet by mouth daily.  . pioglitazone (ACTOS) 15 MG tablet Take 15 mg by mouth daily.  . ramipril (ALTACE) 5 MG capsule Take 5 mg by mouth daily.  . traMADol (ULTRAM) 50 MG tablet Take 1 tablet (50 mg total) by mouth every 6 (six) hours as needed. (Patient not taking: Reported on 06/02/2019)   No current facility-administered medications for this visit. (Other)      REVIEW OF SYSTEMS:    ALLERGIES Allergies  Allergen Reactions  . Darvocet [Propoxyphene N-Acetaminophen]   . Gadolinium Derivatives Hives  . Methotrexate Derivatives   . Percodan [Oxycodone-Aspirin]     PAST MEDICAL HISTORY Past Medical History:  Diagnosis Date  . Diabetes mellitus ([place])   . Diverticula of colon   . External hemorrhoid   . Hypertension   . Internal hemorrhoid   . Kidney stones   . Liver nodule   . Nodule of kidney    Past Surgical History:  Procedure Laterality Date  . ABDOMINAL HYSTERECTOMY  1991  . BREAST REDUCTION SURGERY  1985  . HAND SURGERY  2001   left, pins  . LASIK  2005   bilateral  . LITHOTRIPSY  1983    FAMILY HISTORY Family History  Problem Relation Age of Onset  . Breast cancer Mother   . Diabetes Father   . Prostate  cancer Maternal Uncle   . Stroke Maternal Grandmother   . Heart attack Maternal Grandfather   . Diabetes Brother     SOCIAL HISTORY Social History   Tobacco Use  . Smoking status: Never Smoker  . Smokeless tobacco: Never Used  Substance Use Topics  . Alcohol use: No  . Drug use: No         OPHTHALMIC EXAM:  Base Eye Exam    Visual Acuity (ETDRS)      Right Left   Dist [place] 20/25 +2 20/25 -1       Tonometry (Tonopen, 10:18 AM)      Right Left   Pressure 12 12       Pupils      Pupils Dark Light Shape React APD   Right PERRL 4 3 Round Brisk None   Left PERRL 4 3 Round Brisk None       Visual Fields (Counting fingers)      Left Right    Full Full       Extraocular Movement      Right Left     Full Full       Neuro/Psych    Oriented x3: Yes   Mood/Affect: Normal       Dilation    Both eyes: 1.0% Mydriacyl, 2.5% Phenylephrine @ 10:22 AM        Slit Lamp and Fundus Exam    External Exam      Right Left   External Normal Normal       Slit Lamp Exam      Right Left   Lids/Lashes Normal Normal   Conjunctiva/Sclera White and quiet White and quiet   Cornea Clear Clear   Anterior Chamber Deep and quiet Deep and quiet   Iris Round and reactive Round and reactive   Lens Posterior chamber intraocular lens, Open posterior capsule Posterior chamber intraocular lens, Open posterior capsule   Anterior Vitreous Normal Normal       Fundus Exam      Right Left   Posterior Vitreous Posterior vitreous detachment Normal   Disc Peripapillary atrophy Normal   C/D Ratio 0.6 0.5   Macula Microaneurysms No Macular thickening, focal clinically significant macular edema, superior to the FAZ region, exudates, Microaneurysms, Mild clinically significant macular edema   Vessels NPDR-Severe Retinopathy, severe nonproliferative diabetic retinopathy   Periphery Peripheral retinal nonperfusion Peripheral retinal nonperfusion          IMAGING AND PROCEDURES  Imaging and Procedures for 12/12/19  OCT, Retina - OU - Both Eyes       Right Eye Quality was good. Scan locations included subfoveal. Central Foveal Thickness: 393. Progression has been stable. Findings include abnormal foveal contour.   Left Eye Scan locations included subfoveal. Central Foveal Thickness: 235. Progression has improved. Findings include abnormal foveal contour.   Notes OD, CSME focally juxta foveal slightly improved, compared to last visit.  OS with vastly improved CSME.  Status post Eylea No. 1.  At 9-week interval today.       Intravitreal Injection, Pharmacologic Agent - OS - Left Eye       Time Out 12/12/2019. 11:13 AM. Confirmed correct patient, procedure, site, and patient consented.    Anesthesia Topical anesthesia was used. Anesthetic medications included Akten 3.5%.   Procedure Preparation included 5% betadine to ocular surface, 10% betadine to eyelids, Ofloxacin . A 30 gauge needle was used.   Injection:  2 mg aflibercept (EYLEA) SOLN   NDC:  93818-299-37, Lot: [id number]   Route: Intravitreal, Site: Left Eye, Waste: 0 mg  Post-op Post injection exam found visual acuity of at least counting fingers. The patient tolerated the procedure well. There were no complications. The patient received written and verbal post procedure care education. Post injection medications were not given.                 ASSESSMENT/PLAN:  Severe nonproliferative diabetic retinopathy of left eye, with macular edema, associated with type 2 diabetes mellitus ([place])  The nature of diabetic macular edema was discussed with the patient. Treatment options were outlined including medical therapy, laser & vitrectomy. The use of injectable medications reviewed, including Avastin, Lucentis, and Eylea. Periodic injections into the eye are likely to resolve diabetic macular edema (swelling in the center of vision). Initially, injections are delivered are delivered every 4-6 weeks, and the interval extended as the condition improves. On average, 8-9 injections the first year, and 5 in year 2. Improvement in the condition most often improves on medical therapy. Occasional use of focal laser is also recommended for residual macular edema (swelling). Excellent control of blood glucose and blood pressure are encouraged under the care of a primary physician or endocrinologist. Similarly, attempts to maintain serum cholesterol, low density lipoproteins, and high-density lipoproteins in a favorable range were recommended.   OS, vastly improved status post intravitreal Eylea No. 1 for CSME resistant to Avastin.  Repeat injection today and examination OU in 8 weeks  Severe nonproliferative diabetic retinopathy  of right eye, with macular edema, associated with type 2 diabetes mellitus ([place]) This minor condition OD, has improved over time.  We will continue to observe with excellent acuity and currently      ICD-10-CM   1. Severe nonproliferative diabetic retinopathy of left eye, with macular edema, associated with type 2 diabetes mellitus (HCC)  O17.5102 OCT, Retina - OU - Both Eyes    Intravitreal Injection, Pharmacologic Agent - OS - Left Eye    aflibercept (EYLEA) SOLN 2 mg  2. Severe nonproliferative diabetic retinopathy of right eye, with macular edema, associated with type 2 diabetes mellitus (HCC)  E11.3411 OCT, Retina - OU - Both Eyes    1.  CSME OS vastly improved status post switch from Avastin to intravitreal Eylea.  We will repeat injection today at 9 weeks.  OS long-term plan will be to deliver peripheral panretinal photocoagulation to diminish peripheral retinal nonperfusion, and decrease vegF burden  2.  Minor CSME OD, has improved will continue to observe.  Similar PRP will be needed in the future OD.  3.  Ophthalmic Meds Ordered this visit:  Meds ordered this encounter  Medications  . aflibercept (EYLEA) SOLN 2 mg       Return in about 9 weeks (around 02/13/2020) for dilate, OS, EYLEA OCT.  Patient Instructions  Patient asked to report new onset visual acuity decline or distortions.    Explained the diagnoses, plan, and follow up with the patient and they expressed understanding.  Patient expressed understanding of the importance of proper follow up care.   Clent Demark Darriel Utter M.D. Diseases & Surgery of the Retina and Vitreous Retina & Diabetic [place] 12/12/19     Abbreviations: M myopia (nearsighted); A astigmatism; H hyperopia (farsighted); P presbyopia; Mrx spectacle prescription;  CTL contact lenses; OD right eye; OS left eye; OU both eyes  XT exotropia; ET esotropia; PEK punctate epithelial keratitis; PEE punctate epithelial erosions; DES dry eye syndrome;  MGD meibomian gland dysfunction; ATs  artificial tears; PFAT's preservative free artificial tears; [place] nuclear sclerotic cataract; PSC posterior subcapsular cataract; ERM epi-retinal membrane; PVD posterior vitreous detachment; RD retinal detachment; DM diabetes mellitus; DR diabetic retinopathy; NPDR non-proliferative diabetic retinopathy; PDR proliferative diabetic retinopathy; CSME clinically significant macular edema; DME diabetic macular edema; dbh dot blot hemorrhages; CWS cotton wool spot; POAG primary open angle glaucoma; C/D cup-to-disc ratio; HVF humphrey visual field; GVF goldmann visual field; OCT optical coherence tomography; IOP intraocular pressure; BRVO Branch retinal vein occlusion; CRVO central retinal vein occlusion; CRAO central retinal artery occlusion; BRAO branch retinal artery occlusion; RT retinal tear; SB scleral buckle; PPV pars plana vitrectomy; VH Vitreous hemorrhage; PRP panretinal laser photocoagulation; IVK intravitreal kenalog; VMT vitreomacular traction; MH Macular hole;  NVD neovascularization of the disc; NVE neovascularization elsewhere; AREDS age related eye disease study; ARMD age related macular degeneration; POAG primary open angle glaucoma; EBMD epithelial/anterior basement membrane dystrophy; ACIOL anterior chamber intraocular lens; IOL intraocular lens; PCIOL posterior chamber intraocular lens; Phaco/IOL phacoemulsification with intraocular lens placement; [place] photorefractive keratectomy; LASIK laser assisted in situ keratomileusis; HTN hypertension; DM diabetes mellitus; COPD chronic obstructive pulmonary disease

## 2019-12-20 DIAGNOSIS — M25571 Pain in right ankle and joints of right foot: Secondary | ICD-10-CM | POA: Diagnosis not present

## 2019-12-20 DIAGNOSIS — M25671 Stiffness of right ankle, not elsewhere classified: Secondary | ICD-10-CM | POA: Diagnosis not present

## 2019-12-22 DIAGNOSIS — M25571 Pain in right ankle and joints of right foot: Secondary | ICD-10-CM | POA: Diagnosis not present

## 2019-12-22 DIAGNOSIS — M25671 Stiffness of right ankle, not elsewhere classified: Secondary | ICD-10-CM | POA: Diagnosis not present

## 2019-12-23 DIAGNOSIS — Z23 Encounter for immunization: Secondary | ICD-10-CM | POA: Diagnosis not present

## 2019-12-29 DIAGNOSIS — M25671 Stiffness of right ankle, not elsewhere classified: Secondary | ICD-10-CM | POA: Diagnosis not present

## 2019-12-29 DIAGNOSIS — M25571 Pain in right ankle and joints of right foot: Secondary | ICD-10-CM | POA: Diagnosis not present

## 2020-01-03 DIAGNOSIS — M25671 Stiffness of right ankle, not elsewhere classified: Secondary | ICD-10-CM | POA: Diagnosis not present

## 2020-01-03 DIAGNOSIS — M25571 Pain in right ankle and joints of right foot: Secondary | ICD-10-CM | POA: Diagnosis not present

## 2020-01-05 DIAGNOSIS — M25671 Stiffness of right ankle, not elsewhere classified: Secondary | ICD-10-CM | POA: Diagnosis not present

## 2020-01-05 DIAGNOSIS — M25571 Pain in right ankle and joints of right foot: Secondary | ICD-10-CM | POA: Diagnosis not present

## 2020-01-05 DIAGNOSIS — L4050 Arthropathic psoriasis, unspecified: Secondary | ICD-10-CM | POA: Diagnosis not present

## 2020-01-12 DIAGNOSIS — M25671 Stiffness of right ankle, not elsewhere classified: Secondary | ICD-10-CM | POA: Diagnosis not present

## 2020-01-12 DIAGNOSIS — M25571 Pain in right ankle and joints of right foot: Secondary | ICD-10-CM | POA: Diagnosis not present

## 2020-02-09 ENCOUNTER — Encounter (INDEPENDENT_AMBULATORY_CARE_PROVIDER_SITE_OTHER): Payer: Medicare Other | Admitting: Ophthalmology

## 2020-02-09 DIAGNOSIS — L4050 Arthropathic psoriasis, unspecified: Secondary | ICD-10-CM | POA: Diagnosis not present

## 2020-02-16 ENCOUNTER — Encounter (INDEPENDENT_AMBULATORY_CARE_PROVIDER_SITE_OTHER): Payer: Medicare Other | Admitting: Ophthalmology

## 2020-02-16 ENCOUNTER — Encounter (INDEPENDENT_AMBULATORY_CARE_PROVIDER_SITE_OTHER): Payer: Self-pay | Admitting: Ophthalmology

## 2020-02-16 ENCOUNTER — Ambulatory Visit (INDEPENDENT_AMBULATORY_CARE_PROVIDER_SITE_OTHER): Payer: Medicare Other | Admitting: Ophthalmology

## 2020-02-16 ENCOUNTER — Other Ambulatory Visit: Payer: Self-pay

## 2020-02-16 DIAGNOSIS — E113411 Type 2 diabetes mellitus with severe nonproliferative diabetic retinopathy with macular edema, right eye: Secondary | ICD-10-CM | POA: Diagnosis not present

## 2020-02-16 DIAGNOSIS — E113412 Type 2 diabetes mellitus with severe nonproliferative diabetic retinopathy with macular edema, left eye: Secondary | ICD-10-CM

## 2020-02-16 MED ORDER — AFLIBERCEPT 2MG/0.05ML IZ SOLN FOR KALEIDOSCOPE
2.0000 mg | INTRAVITREAL | Status: AC | PRN
  Administered 2020-02-16: 2 mg via INTRAVITREAL

## 2020-02-16 NOTE — Assessment & Plan Note (Signed)
CSME noncentral involved persisting, will evaluate next

## 2020-02-16 NOTE — Progress Notes (Signed)
02/16/2020     CHIEF COMPLAINT Patient presents for Retina Follow Up (9 WK FU OS, POSS EYLEA OS///Pt reports cloudy vision OS>OD, no new F/F OS, no pain or pressure. /////Last A1C: 7.2 taken 10/2019////Last BS: 105 taken this AM  )   HISTORY OF PRESENT ILLNESS: Shannon Craig is a 69 y.o. female who presents to the clinic today for:   HPI    Retina Follow Up    Patient presents with  Diabetic Retinopathy.  In both eyes.  This started 9 weeks ago.  Duration of 9 weeks. Additional comments: 9 WK FU OS, POSS EYLEA OS   Pt reports cloudy vision OS>OD, no new F/F OS, no pain or pressure.      Last A1C: 7.2 taken 10/2019    Last BS: 105 taken this AM         Last edited by Nichola Sizer D on 02/16/2020  9:30 AM. (History)      Referring physician: Merrilee Seashore, MD [place] [address] [place],  [place] [address]  HISTORICAL INFORMATION:   Selected notes from the [facility]: No current outpatient medications on file. (Ophthalmic Drugs)   No current facility-administered medications for this visit. (Ophthalmic Drugs)   Current Outpatient Medications (Other)  Medication Sig  . Albiglutide (TANZEUM) 50 MG PEN Inject into the skin.  . Alogliptin Benzoate (NESINA) 25 MG TABS Take by mouth.  Marland Kitchen atorvastatin (LIPITOR) 40 MG tablet Take 40 mg by mouth daily.  . Calcium-Vitamin D-Vitamin K (VIACTIV PO) Take 1 tablet by mouth daily.  . Canagliflozin (INVOKANA) 300 MG TABS Take 300 mg by mouth daily.  . Certolizumab Pegol (CIMZIA) 2 X 200 MG KIT Inject 1 mL into the skin.  Marland Kitchen diltiazem (CARDIZEM) 120 MG tablet Take by mouth.  . empagliflozin (JARDIANCE) 25 MG TABS tablet Take 25 mg by mouth daily.  Marland Kitchen HYDROcodone-acetaminophen (NORCO/VICODIN) 5-325 MG tablet Take 1 tablet by mouth every 6 (six) hours as needed.  . InFLIXimab (REMICADE IV) Inject into the vein.  Marland Kitchen insulin aspart protamine - aspart (NOVOLOG MIX 70/30 FLEXPEN)  (70-30) 100 UNIT/ML FlexPen inject 25 units before breakfast and 10 units before dinner subcutaneously  . meloxicam (MOBIC) 15 MG tablet   . Multiple Vitamin (MULTIVITAMIN) tablet Take 1 tablet by mouth daily.  . pioglitazone (ACTOS) 15 MG tablet Take 15 mg by mouth daily.  . ramipril (ALTACE) 5 MG capsule Take 5 mg by mouth daily.  . traMADol (ULTRAM) 50 MG tablet Take 1 tablet (50 mg total) by mouth every 6 (six) hours as needed. (Patient not taking: Reported on 06/02/2019)   No current facility-administered medications for this visit. (Other)      REVIEW OF SYSTEMS:    ALLERGIES Allergies  Allergen Reactions  . Darvocet [Propoxyphene N-Acetaminophen]   . Gadolinium Derivatives Hives  . Methotrexate Derivatives   . Percodan [Oxycodone-Aspirin]     PAST MEDICAL HISTORY Past Medical History:  Diagnosis Date  . Diabetes mellitus ([place])   . Diverticula of colon   . External hemorrhoid   . Hypertension   . Internal hemorrhoid   . Kidney stones   . Liver nodule   . Nodule of kidney    Past Surgical History:  Procedure Laterality Date  . ABDOMINAL HYSTERECTOMY  1991  . BREAST REDUCTION SURGERY  1985  . HAND SURGERY  2001   left, pins  . LASIK  2005   bilateral  .  LITHOTRIPSY  1983    FAMILY HISTORY Family History  Problem Relation Age of Onset  . Breast cancer Mother   . Diabetes Father   . Prostate cancer Maternal Uncle   . Stroke Maternal Grandmother   . Heart attack Maternal Grandfather   . Diabetes Brother     SOCIAL HISTORY Social History   Tobacco Use  . Smoking status: Never Smoker  . Smokeless tobacco: Never Used  Substance Use Topics  . Alcohol use: No  . Drug use: No         OPHTHALMIC EXAM:  Base Eye Exam    Visual Acuity (ETDRS)      Right Left   Dist [place] 20/25 +2 20/40 +1   Dist ph [place]  NI       Tonometry (Tonopen, 9:37 AM)      Right Left   Pressure 12 14       Pupils      Dark Light Shape React APD   Right 4 3 Round Brisk  None   Left 4 3 Round Brisk None       Visual Fields (Counting fingers)      Left Right    Full    Restrictions  Partial outer inferior temporal deficiency       Extraocular Movement      Right Left    Full Full       Neuro/Psych    Oriented x3: Yes   Mood/Affect: Normal       Dilation    Left eye: 1.0% Mydriacyl, 2.5% Phenylephrine @ 9:37 AM        Slit Lamp and Fundus Exam    External Exam      Right Left   External Normal Normal       Slit Lamp Exam      Right Left   Lids/Lashes Normal Normal   Conjunctiva/Sclera White and quiet White and quiet   Cornea Clear Clear   Anterior Chamber Deep and quiet Deep and quiet   Iris Round and reactive Round and reactive   Lens Posterior chamber intraocular lens, Open posterior capsule Posterior chamber intraocular lens, Open posterior capsule   Anterior Vitreous Normal Normal       Fundus Exam      Right Left   Posterior Vitreous Posterior vitreous detachment    Disc Peripapillary atrophy    C/D Ratio 0.7    Macula Microaneurysms, Macular thickening, Hard drusen, Moderate clinically significant macular edema    Vessels NPDR-Severe    Periphery Peripheral retinal nonperfusion           IMAGING AND PROCEDURES  Imaging and Procedures for 02/16/20  OCT, Retina - OU - Both Eyes       Right Eye Quality was good. Scan locations included subfoveal. Central Foveal Thickness: 383. Progression has improved.   Left Eye Quality was good. Scan locations included subfoveal. Central Foveal Thickness: 358. Progression has worsened.   Notes CSME OS increasing.  Will need to repeat intravitreal Eylea today and examination in 7 weeks OU       Intravitreal Injection, Pharmacologic Agent - OS - Left Eye       Time Out 02/16/2020. 10:25 AM. Confirmed correct patient, procedure, site, and patient consented.   Anesthesia Topical anesthesia was used. Anesthetic medications included Akten 3.5%.   Procedure Preparation  included 5% betadine to ocular surface, 10% betadine to eyelids, Ofloxacin . A 30 gauge needle was used.   Injection:  2 mg aflibercept Alfonse Flavors) SOLN   NDC: A3590391, Lot: [id number]   Route: Intravitreal, Site: Left Eye, Waste: 0 mg  Post-op Post injection exam found visual acuity of at least counting fingers. The patient tolerated the procedure well. There were no complications. The patient received written and verbal post procedure care education. Post injection medications were not given.                 ASSESSMENT/PLAN:  Severe nonproliferative diabetic retinopathy of right eye, with macular edema, associated with type 2 diabetes mellitus (HCC) CSME noncentral involved persisting, will evaluate next      ICD-10-CM   1. Severe nonproliferative diabetic retinopathy of left eye, with macular edema, associated with type 2 diabetes mellitus (HCC)  V76.1607 OCT, Retina - OU - Both Eyes    Intravitreal Injection, Pharmacologic Agent - OS - Left Eye    aflibercept (EYLEA) SOLN 2 mg  2. Severe nonproliferative diabetic retinopathy of right eye, with macular edema, associated with type 2 diabetes mellitus ([place])  E11.3411     1.  OS with increased CSME at 9-week follow-up interval on Eylea.  We will repeat injection today to bring this under control on follow-up examination in 7 weeks  2.  Dilate OU next and perform fluorescein angiography op toes left right next  3.  Likely injection OS Eylea next  Ophthalmic Meds Ordered this visit:  Meds ordered this encounter  Medications  . aflibercept (EYLEA) SOLN 2 mg       Return in about 7 weeks (around 04/05/2020) for DILATE OU, OPTOS FFA L/R, EYLEA OCT, OS.  There are no Patient Instructions on file for this visit.   Explained the diagnoses, plan, and follow up with the patient and they expressed understanding.  Patient expressed understanding of the importance of proper follow up care.   Clent Demark  M.D. Diseases &  Surgery of the Retina and Vitreous Retina & Diabetic [place] 02/16/20     Abbreviations: M myopia (nearsighted); A astigmatism; H hyperopia (farsighted); P presbyopia; Mrx spectacle prescription;  CTL contact lenses; OD right eye; OS left eye; OU both eyes  XT exotropia; ET esotropia; PEK punctate epithelial keratitis; PEE punctate epithelial erosions; DES dry eye syndrome; MGD meibomian gland dysfunction; ATs artificial tears; PFAT's preservative free artificial tears; [place] nuclear sclerotic cataract; PSC posterior subcapsular cataract; ERM epi-retinal membrane; PVD posterior vitreous detachment; RD retinal detachment; DM diabetes mellitus; DR diabetic retinopathy; NPDR non-proliferative diabetic retinopathy; PDR proliferative diabetic retinopathy; CSME clinically significant macular edema; DME diabetic macular edema; dbh dot blot hemorrhages; CWS cotton wool spot; POAG primary open angle glaucoma; C/D cup-to-disc ratio; HVF humphrey visual field; GVF goldmann visual field; OCT optical coherence tomography; IOP intraocular pressure; BRVO Branch retinal vein occlusion; CRVO central retinal vein occlusion; CRAO central retinal artery occlusion; BRAO branch retinal artery occlusion; RT retinal tear; SB scleral buckle; PPV pars plana vitrectomy; VH Vitreous hemorrhage; PRP panretinal laser photocoagulation; IVK intravitreal kenalog; VMT vitreomacular traction; MH Macular hole;  NVD neovascularization of the disc; NVE neovascularization elsewhere; AREDS age related eye disease study; ARMD age related macular degeneration; POAG primary open angle glaucoma; EBMD epithelial/anterior basement membrane dystrophy; ACIOL anterior chamber intraocular lens; IOL intraocular lens; PCIOL posterior chamber intraocular lens; Phaco/IOL phacoemulsification with intraocular lens placement; [place] photorefractive keratectomy; LASIK laser assisted in situ keratomileusis; HTN hypertension; DM diabetes mellitus; COPD chronic  obstructive pulmonary disease

## 2020-02-20 DIAGNOSIS — I10 Essential (primary) hypertension: Secondary | ICD-10-CM | POA: Diagnosis not present

## 2020-02-20 DIAGNOSIS — E118 Type 2 diabetes mellitus with unspecified complications: Secondary | ICD-10-CM | POA: Diagnosis not present

## 2020-02-20 DIAGNOSIS — E7800 Pure hypercholesterolemia, unspecified: Secondary | ICD-10-CM | POA: Diagnosis not present

## 2020-02-20 DIAGNOSIS — Z9641 Presence of insulin pump (external) (internal): Secondary | ICD-10-CM | POA: Diagnosis not present

## 2020-03-08 DIAGNOSIS — L4050 Arthropathic psoriasis, unspecified: Secondary | ICD-10-CM | POA: Diagnosis not present

## 2020-03-15 DIAGNOSIS — E782 Mixed hyperlipidemia: Secondary | ICD-10-CM | POA: Diagnosis not present

## 2020-03-15 DIAGNOSIS — E1140 Type 2 diabetes mellitus with diabetic neuropathy, unspecified: Secondary | ICD-10-CM | POA: Diagnosis not present

## 2020-03-15 DIAGNOSIS — I700 Atherosclerosis of aorta: Secondary | ICD-10-CM | POA: Diagnosis not present

## 2020-03-15 DIAGNOSIS — I10 Essential (primary) hypertension: Secondary | ICD-10-CM | POA: Diagnosis not present

## 2020-03-15 DIAGNOSIS — L4050 Arthropathic psoriasis, unspecified: Secondary | ICD-10-CM | POA: Diagnosis not present

## 2020-03-15 DIAGNOSIS — M810 Age-related osteoporosis without current pathological fracture: Secondary | ICD-10-CM | POA: Diagnosis not present

## 2020-03-15 DIAGNOSIS — E11319 Type 2 diabetes mellitus with unspecified diabetic retinopathy without macular edema: Secondary | ICD-10-CM | POA: Diagnosis not present

## 2020-03-19 DIAGNOSIS — E1169 Type 2 diabetes mellitus with other specified complication: Secondary | ICD-10-CM | POA: Diagnosis not present

## 2020-03-19 DIAGNOSIS — I700 Atherosclerosis of aorta: Secondary | ICD-10-CM | POA: Diagnosis not present

## 2020-03-19 DIAGNOSIS — W19XXXD Unspecified fall, subsequent encounter: Secondary | ICD-10-CM | POA: Diagnosis not present

## 2020-03-19 DIAGNOSIS — E782 Mixed hyperlipidemia: Secondary | ICD-10-CM | POA: Diagnosis not present

## 2020-03-19 DIAGNOSIS — D696 Thrombocytopenia, unspecified: Secondary | ICD-10-CM | POA: Diagnosis not present

## 2020-03-19 DIAGNOSIS — I10 Essential (primary) hypertension: Secondary | ICD-10-CM | POA: Diagnosis not present

## 2020-04-05 ENCOUNTER — Encounter (INDEPENDENT_AMBULATORY_CARE_PROVIDER_SITE_OTHER): Payer: Self-pay | Admitting: Ophthalmology

## 2020-04-05 ENCOUNTER — Encounter (INDEPENDENT_AMBULATORY_CARE_PROVIDER_SITE_OTHER): Payer: Medicare Other | Admitting: Ophthalmology

## 2020-04-05 ENCOUNTER — Ambulatory Visit (INDEPENDENT_AMBULATORY_CARE_PROVIDER_SITE_OTHER): Payer: Medicare Other | Admitting: Ophthalmology

## 2020-04-05 ENCOUNTER — Other Ambulatory Visit: Payer: Self-pay

## 2020-04-05 DIAGNOSIS — E113412 Type 2 diabetes mellitus with severe nonproliferative diabetic retinopathy with macular edema, left eye: Secondary | ICD-10-CM | POA: Diagnosis not present

## 2020-04-05 DIAGNOSIS — E113411 Type 2 diabetes mellitus with severe nonproliferative diabetic retinopathy with macular edema, right eye: Secondary | ICD-10-CM

## 2020-04-05 MED ORDER — AFLIBERCEPT 2MG/0.05ML IZ SOLN FOR KALEIDOSCOPE
2.0000 mg | INTRAVITREAL | Status: AC | PRN
  Administered 2020-04-05: 2 mg via INTRAVITREAL

## 2020-04-05 MED ORDER — FLUORESCEIN SODIUM 10 % IV SOLN
500.0000 mg | INTRAVENOUS | Status: AC | PRN
  Administered 2020-04-05: 500 mg via INTRAVENOUS

## 2020-04-05 NOTE — Progress Notes (Signed)
04/05/2020     CHIEF COMPLAINT Patient presents for Retina Follow Up (7 Week NPDR f\u. Possible Eylea OS. OCT. FP and FF L/R/Pt states vision is not clear. Pt states acuity is fine but her vision is cloudy./BGL:83)   HISTORY OF PRESENT ILLNESS: Shannon Craig is a 70 y.o. female who presents to the clinic today for:   HPI    Retina Follow Up    Patient presents with  Diabetic Retinopathy.  In left eye.  Severity is severe.  Duration of 7 weeks.  Since onset it is stable.  I, the attending physician,  performed the HPI with the patient and updated documentation appropriately. Additional comments: 7 Week NPDR f\u. Possible Eylea OS. OCT. FP and FF L/R Pt states vision is not clear. Pt states acuity is fine but her vision is cloudy. BGL:83       Last edited by Tilda Franco on 04/05/2020 10:50 AM. (History)      Referring physician: Merrilee Seashore, MD [address] [place],  [place] [address]  HISTORICAL INFORMATION:   Selected notes from the MEDICAL RECORD NUMBER       CURRENT MEDICATIONS: No current outpatient medications on file. (Ophthalmic Drugs)   No current facility-administered medications for this visit. (Ophthalmic Drugs)   Current Outpatient Medications (Other)  Medication Sig  . Albiglutide (TANZEUM) 50 MG PEN Inject into the skin.  . Alogliptin Benzoate (NESINA) 25 MG TABS Take by mouth.  Marland Kitchen atorvastatin (LIPITOR) 40 MG tablet Take 40 mg by mouth daily.  . Calcium-Vitamin D-Vitamin K (VIACTIV PO) Take 1 tablet by mouth daily.  . Canagliflozin (INVOKANA) 300 MG TABS Take 300 mg by mouth daily.  . Certolizumab Pegol (CIMZIA) 2 X 200 MG KIT Inject 1 mL into the skin.  Marland Kitchen diltiazem (CARDIZEM) 120 MG tablet Take by mouth.  . empagliflozin (JARDIANCE) 25 MG TABS tablet Take 25 mg by mouth daily.  Marland Kitchen HYDROcodone-acetaminophen (NORCO/VICODIN) 5-325 MG tablet Take 1 tablet by mouth every 6 (six) hours as needed.  . InFLIXimab (REMICADE IV) Inject into  the vein.  Marland Kitchen insulin aspart protamine - aspart (NOVOLOG MIX 70/30 FLEXPEN) (70-30) 100 UNIT/ML FlexPen inject 25 units before breakfast and 10 units before dinner subcutaneously  . meloxicam (MOBIC) 15 MG tablet   . Multiple Vitamin (MULTIVITAMIN) tablet Take 1 tablet by mouth daily.  . pioglitazone (ACTOS) 15 MG tablet Take 15 mg by mouth daily.  . ramipril (ALTACE) 5 MG capsule Take 5 mg by mouth daily.  . traMADol (ULTRAM) 50 MG tablet Take 1 tablet (50 mg total) by mouth every 6 (six) hours as needed. (Patient not taking: Reported on 06/02/2019)   No current facility-administered medications for this visit. (Other)      REVIEW OF SYSTEMS: ROS    Positive for: Endocrine   Last edited by Tilda Franco on 04/05/2020 10:50 AM. (History)       ALLERGIES Allergies  Allergen Reactions  . Darvocet [Propoxyphene N-Acetaminophen]   . Gadolinium Derivatives Hives  . Methotrexate Derivatives   . Percodan [Oxycodone-Aspirin]     PAST MEDICAL HISTORY Past Medical History:  Diagnosis Date  . Diabetes mellitus ([place])   . Diverticula of colon   . External hemorrhoid   . Hypertension   . Internal hemorrhoid   . Kidney stones   . Liver nodule   . Nodule of kidney    Past Surgical History:  Procedure Laterality Date  . ABDOMINAL HYSTERECTOMY  1991  .  BREAST REDUCTION SURGERY  1985  . HAND SURGERY  2001   left, pins  . LASIK  2005   bilateral  . LITHOTRIPSY  1983    FAMILY HISTORY Family History  Problem Relation Age of Onset  . Breast cancer Mother   . Diabetes Father   . Prostate cancer Maternal Uncle   . Stroke Maternal Grandmother   . Heart attack Maternal Grandfather   . Diabetes Brother     SOCIAL HISTORY Social History   Tobacco Use  . Smoking status: Never Smoker  . Smokeless tobacco: Never Used  Substance Use Topics  . Alcohol use: No  . Drug use: No         OPHTHALMIC EXAM:  Base Eye Exam    Visual Acuity (Snellen - Linear)      Right Left    Dist [place] 20/25 20/30 -1       Tonometry (Tonopen, 10:54 AM)      Right Left   Pressure 15 15       Pupils      Pupils Dark Light Shape React APD   Right PERRL 3 2 Round Brisk None   Left PERRL 3 2 Round Brisk None       Visual Fields (Counting fingers)      Left Right    Full Full       Neuro/Psych    Oriented x3: Yes   Mood/Affect: Normal       Dilation    Both eyes: 1.0% Mydriacyl, 2.5% Phenylephrine @ 10:54 AM        Slit Lamp and Fundus Exam    External Exam      Right Left   External Normal Normal       Slit Lamp Exam      Right Left   Lids/Lashes Normal Normal   Conjunctiva/Sclera White and quiet White and quiet   Cornea Clear Clear   Anterior Chamber Deep and quiet Deep and quiet   Iris Round and reactive Round and reactive   Lens Posterior chamber intraocular lens, Open posterior capsule Posterior chamber intraocular lens, Open posterior capsule   Anterior Vitreous Normal Normal       Fundus Exam      Right Left   Posterior Vitreous Posterior vitreous detachment Normal   Disc Peripapillary atrophy Normal   C/D Ratio 0.7 0.5   Macula Microaneurysms, Macular thickening, Hard drusen, Moderate clinically significant macular edema No Macular thickening, focal clinically significant macular edema, superior to the FAZ region, exudates, Microaneurysms, Mild clinically significant macular edema   Vessels NPDR-Severe Retinopathy, severe nonproliferative diabetic retinopathy   Periphery Peripheral retinal nonperfusion, anterior nasal Peripheral retinal nonperfusion nasal and inferior          IMAGING AND PROCEDURES  Imaging and Procedures for 04/05/20  OCT, Retina - OU - Both Eyes       Right Eye Quality was good. Scan locations included subfoveal. Central Foveal Thickness: 529. Progression has worsened.   Left Eye Quality was good. Scan locations included subfoveal. Central Foveal Thickness: 205. Progression has improved.   Notes CSME OD center  involved will need intravitreal antivegF promptly  OS will repeat intravitreal Eylea today as recent CSME has vastly improved.       Color Fundus Photography Optos - OU - Both Eyes       Right Eye Progression has worsened. Disc findings include normal observations. Macula : microaneurysms.   Left Eye Progression has worsened. Disc findings include  normal observations. Macula : microaneurysms.   Notes OD with severe nonproliferative diabetic retinopathy, status post moderate scatter panel photocoagulation and focal laser treatment.  CSME OD recurrent will need treatment  OS with similar findings with severe NPDR status post focal laser treatment inferiorly.  CSME improved OS centrally       Fluorescein Angiography Optos (Transit OS)       Injection:  500 mg Fluorescein Sodium 10 % injection   NDC: 817 [id number] 3111   Route: Intravenous, Site: Left ArmRight Eye   Progression has worsened. Mid/Late phase findings include leakage, vascular perfusion defect. Choroidal neovascularization is not present.   Left Eye   Progression has worsened. Early phase findings include leakage, microaneurysm. Mid/Late phase findings include vascular perfusion defect. Choroidal neovascularization is not present.   Notes Severe capillary dropout OU OD worse than OS.  CSME as noted OS,  Will need intravitreal Eylea OS today and examination soon and possible PRP OS in the future  OD will need peripheral PRP with attention nasal to the nerve as well as temporal anterior periphery       Intravitreal Injection, Pharmacologic Agent - OS - Left Eye       Time Out 04/05/2020. 12:03 PM. Confirmed correct patient, procedure, site, and patient consented.   Anesthesia Topical anesthesia was used. Anesthetic medications included Akten 3.5%.   Procedure Preparation included 5% betadine to ocular surface, 10% betadine to eyelids, Ofloxacin . A 30 gauge needle was used.   Injection:  2 mg  aflibercept Alfonse Flavors) SOLN   NDC: A3590391, Lot: [id number]   Route: Intravitreal, Site: Left Eye, Waste: 0 mg  Post-op Post injection exam found visual acuity of at least counting fingers. The patient tolerated the procedure well. There were no complications. The patient received written and verbal post procedure care education. Post injection medications were not given.                 ASSESSMENT/PLAN:  Severe nonproliferative diabetic retinopathy of left eye, with macular edema, associated with type 2 diabetes mellitus (HCC) CSME OS severe combined with severe NPDR.  Moderate scatter PRP noted inferonasal and nasal.  Need intravitreal Eylea OS today to prevent progression of severe NPDR but more importantly treat the CSME so that peripheral PRP can be delivered to induce total quiescent's of diabetic retinopathy  Severe nonproliferative diabetic retinopathy of right eye, with macular edema, associated with type 2 diabetes mellitus ([place]) Fluorescein retinal angiographic findings today's disclosed severe capillary dropout particularly nasal to the optic nerve not in the region of previous PRP nasally  With CSME noted by OCT will need intravitreal Eylea OD soon followed thereafter by PRP  OD will need peripheral PRP with attention nasal to the nerve as well as temporal anterior periphery      ICD-10-CM   1. Severe nonproliferative diabetic retinopathy of left eye, with macular edema, associated with type 2 diabetes mellitus (HCC)  Q59.5638 OCT, Retina - OU - Both Eyes    Color Fundus Photography Optos - OU - Both Eyes    Fluorescein Angiography Optos (Transit OS)    Intravitreal Injection, Pharmacologic Agent - OS - Left Eye    aflibercept (EYLEA) SOLN 2 mg    Fluorescein Sodium 10 % injection 500 mg  2. Severe nonproliferative diabetic retinopathy of right eye, with macular edema, associated with type 2 diabetes mellitus (HCC)  E11.3411     1.  OS, injection intravitreal  Eylea today to maintain massive, vastly improved macular edema, anticipating  PRP in the left eye in the near future  2.  OD, center involved CSME confirmed will need intravitreal Eylea in 10 days or so followed thereafter by PRP  3.  Ophthalmic Meds Ordered this visit:  Meds ordered this encounter  Medications  . aflibercept (EYLEA) SOLN 2 mg  . Fluorescein Sodium 10 % injection 500 mg       Return in about 10 days (around 04/15/2020) for dilate, OD, EYLEA OCT.  There are no Patient Instructions on file for this visit.   Explained the diagnoses, plan, and follow up with the patient and they expressed understanding.  Patient expressed understanding of the importance of proper follow up care.   Clent Demark Natasha Paulson M.D. Diseases & Surgery of the Retina and Vitreous Retina & Diabetic [place] 04/05/20     Abbreviations: M myopia (nearsighted); A astigmatism; H hyperopia (farsighted); P presbyopia; Mrx spectacle prescription;  CTL contact lenses; OD right eye; OS left eye; OU both eyes  XT exotropia; ET esotropia; PEK punctate epithelial keratitis; PEE punctate epithelial erosions; DES dry eye syndrome; MGD meibomian gland dysfunction; ATs artificial tears; PFAT's preservative free artificial tears; [place] nuclear sclerotic cataract; PSC posterior subcapsular cataract; ERM epi-retinal membrane; PVD posterior vitreous detachment; RD retinal detachment; DM diabetes mellitus; DR diabetic retinopathy; NPDR non-proliferative diabetic retinopathy; PDR proliferative diabetic retinopathy; CSME clinically significant macular edema; DME diabetic macular edema; dbh dot blot hemorrhages; CWS cotton wool spot; POAG primary open angle glaucoma; C/D cup-to-disc ratio; HVF humphrey visual field; GVF goldmann visual field; OCT optical coherence tomography; IOP intraocular pressure; BRVO Branch retinal vein occlusion; CRVO central retinal vein occlusion; CRAO central retinal artery occlusion; BRAO branch retinal  artery occlusion; RT retinal tear; SB scleral buckle; PPV pars plana vitrectomy; VH Vitreous hemorrhage; PRP panretinal laser photocoagulation; IVK intravitreal kenalog; VMT vitreomacular traction; MH Macular hole;  NVD neovascularization of the disc; NVE neovascularization elsewhere; AREDS age related eye disease study; ARMD age related macular degeneration; POAG primary open angle glaucoma; EBMD epithelial/anterior basement membrane dystrophy; ACIOL anterior chamber intraocular lens; IOL intraocular lens; PCIOL posterior chamber intraocular lens; Phaco/IOL phacoemulsification with intraocular lens placement; [place] photorefractive keratectomy; LASIK laser assisted in situ keratomileusis; HTN hypertension; DM diabetes mellitus; COPD chronic obstructive pulmonary disease

## 2020-04-05 NOTE — Assessment & Plan Note (Signed)
CSME OS severe combined with severe NPDR.  Moderate scatter PRP noted inferonasal and nasal.  Need intravitreal Eylea OS today to prevent progression of severe NPDR but more importantly treat the CSME so that peripheral PRP can be delivered to induce total quiescent's of diabetic retinopathy

## 2020-04-05 NOTE — Assessment & Plan Note (Addendum)
Fluorescein retinal angiographic findings today's disclosed severe capillary dropout particularly nasal to the optic nerve not in the region of previous PRP nasally  With CSME noted by OCT will need intravitreal Eylea OD soon followed thereafter by PRP  OD will need peripheral PRP with attention nasal to the nerve as well as temporal anterior periphery

## 2020-04-06 DIAGNOSIS — L4050 Arthropathic psoriasis, unspecified: Secondary | ICD-10-CM | POA: Diagnosis not present

## 2020-04-12 ENCOUNTER — Encounter (INDEPENDENT_AMBULATORY_CARE_PROVIDER_SITE_OTHER): Payer: Medicare Other | Admitting: Ophthalmology

## 2020-04-16 ENCOUNTER — Encounter (INDEPENDENT_AMBULATORY_CARE_PROVIDER_SITE_OTHER): Payer: Self-pay | Admitting: Ophthalmology

## 2020-04-16 ENCOUNTER — Other Ambulatory Visit: Payer: Self-pay

## 2020-04-16 ENCOUNTER — Ambulatory Visit (INDEPENDENT_AMBULATORY_CARE_PROVIDER_SITE_OTHER): Payer: Medicare Other | Admitting: Ophthalmology

## 2020-04-16 DIAGNOSIS — E113412 Type 2 diabetes mellitus with severe nonproliferative diabetic retinopathy with macular edema, left eye: Secondary | ICD-10-CM | POA: Diagnosis not present

## 2020-04-16 DIAGNOSIS — E113411 Type 2 diabetes mellitus with severe nonproliferative diabetic retinopathy with macular edema, right eye: Secondary | ICD-10-CM

## 2020-04-16 MED ORDER — AFLIBERCEPT 2MG/0.05ML IZ SOLN FOR KALEIDOSCOPE
2.0000 mg | INTRAVITREAL | Status: AC | PRN
  Administered 2020-04-16: 2 mg via INTRAVITREAL

## 2020-04-16 NOTE — Assessment & Plan Note (Signed)
Massive center involved CSME, will deliver intravitreal Eylea OD today, may need focal and/or PRP in the future

## 2020-04-16 NOTE — Assessment & Plan Note (Signed)
OS improved macular edema post recent Eylea, preserved acuity

## 2020-04-16 NOTE — Progress Notes (Addendum)
04/16/2020     CHIEF COMPLAINT Patient presents for Retina Follow Up (10 Day  FU OD, POSS Eylea OD///Pt reports stable vision OD. Pt denies any new F/F, pain, or pressure OD.////Last BS: 108 this AM. )   HISTORY OF PRESENT ILLNESS: Shannon Craig is a 70 y.o. female who presents to the clinic today for:   HPI    Retina Follow Up    Patient presents with  Diabetic Retinopathy.  In right eye.  This started 10 days ago.  Duration of 10 days.  Since onset it is stable. Additional comments: 10 Day  FU OD, POSS Eylea OD   Pt reports stable vision OD. Pt denies any new F/F, pain, or pressure OD.    Last BS: 108 this AM.        Last edited by Nichola Sizer D on 04/16/2020  9:52 AM. (History)      Referring physician: Merrilee Seashore, [address],  [place] [address]  HISTORICAL INFORMATION:   Selected notes from the [facility]: No current outpatient medications on file. (Ophthalmic Drugs)   No current facility-administered medications for this visit. (Ophthalmic Drugs)   Current Outpatient Medications (Other)  Medication Sig  . Albiglutide (TANZEUM) 50 MG PEN Inject into the skin.  . Alogliptin Benzoate (NESINA) 25 MG TABS Take by mouth.  Marland Kitchen atorvastatin (LIPITOR) 40 MG tablet Take 40 mg by mouth daily.  . Calcium-Vitamin D-Vitamin K (VIACTIV PO) Take 1 tablet by mouth daily.  . Canagliflozin (INVOKANA) 300 MG TABS Take 300 mg by mouth daily.  . Certolizumab Pegol (CIMZIA) 2 X 200 MG KIT Inject 1 mL into the skin.  Marland Kitchen diltiazem (CARDIZEM) 120 MG tablet Take by mouth.  . empagliflozin (JARDIANCE) 25 MG TABS tablet Take 25 mg by mouth daily.  Marland Kitchen HYDROcodone-acetaminophen (NORCO/VICODIN) 5-325 MG tablet Take 1 tablet by mouth every 6 (six) hours as needed.  . InFLIXimab (REMICADE IV) Inject into the vein.  Marland Kitchen insulin aspart protamine - aspart (NOVOLOG MIX 70/30 FLEXPEN) (70-30) 100 UNIT/ML FlexPen inject 25  units before breakfast and 10 units before dinner subcutaneously  . meloxicam (MOBIC) 15 MG tablet   . Multiple Vitamin (MULTIVITAMIN) tablet Take 1 tablet by mouth daily.  . pioglitazone (ACTOS) 15 MG tablet Take 15 mg by mouth daily.  . ramipril (ALTACE) 5 MG capsule Take 5 mg by mouth daily.  . traMADol (ULTRAM) 50 MG tablet Take 1 tablet (50 mg total) by mouth every 6 (six) hours as needed. (Patient not taking: Reported on 06/02/2019)   No current facility-administered medications for this visit. (Other)      REVIEW OF SYSTEMS:    ALLERGIES Allergies  Allergen Reactions  . Darvocet [Propoxyphene N-Acetaminophen]   . Gadolinium Derivatives Hives  . Methotrexate Derivatives   . Percodan [Oxycodone-Aspirin]     PAST MEDICAL HISTORY Past Medical History:  Diagnosis Date  . Diabetes mellitus ([place])   . Diverticula of colon   . External hemorrhoid   . Hypertension   . Internal hemorrhoid   . Kidney stones   . Liver nodule   . Nodule of kidney    Past Surgical History:  Procedure Laterality Date  . ABDOMINAL HYSTERECTOMY  1991  . BREAST REDUCTION SURGERY  1985  . HAND SURGERY  2001   left, pins  . LASIK  2005   bilateral  . LITHOTRIPSY  1983    FAMILY HISTORY  Family History  Problem Relation Age of Onset  . Breast cancer Mother   . Diabetes Father   . Prostate cancer Maternal Uncle   . Stroke Maternal Grandmother   . Heart attack Maternal Grandfather   . Diabetes Brother     SOCIAL HISTORY Social History   Tobacco Use  . Smoking status: Never Smoker  . Smokeless tobacco: Never Used  Substance Use Topics  . Alcohol use: No  . Drug use: No         OPHTHALMIC EXAM:  Base Eye Exam    Visual Acuity (ETDRS)      Right Left   Dist [place] 20/25 +2 20/30 +2   Dist ph [place]  NI       Tonometry (Tonopen, 9:54 AM)      Right Left   Pressure 17 13       Pupils      Pupils Dark Light Shape React APD   Right PERRL 3 2 Round Brisk None   Left PERRL 3 2  Round Brisk None       Visual Fields (Counting fingers)      Left Right    Full    Restrictions  Partial outer inferior temporal deficiency       Extraocular Movement      Right Left    Full Full       Neuro/Psych    Oriented x3: Yes   Mood/Affect: Normal       Dilation    Right eye: 1.0% Mydriacyl, 2.5% Phenylephrine @ 9:55 AM        Slit Lamp and Fundus Exam    External Exam      Right Left   External Normal Normal       Slit Lamp Exam      Right Left   Lids/Lashes Normal Normal   Conjunctiva/Sclera White and quiet White and quiet   Cornea Clear Clear   Anterior Chamber Deep and quiet Deep and quiet   Iris Round and reactive Round and reactive   Lens Posterior chamber intraocular lens, Open posterior capsule Posterior chamber intraocular lens, Open posterior capsule   Anterior Vitreous Normal Normal       Fundus Exam      Right Left   Posterior Vitreous Posterior vitreous detachment    Disc Peripapillary atrophy    C/D Ratio 0.7    Macula Microaneurysms, Macular thickening, Hard drusen, Moderate clinically significant macular edema, Exudates    Vessels NPDR-Severe    Periphery Peripheral retinal nonperfusion, anterior nasal           IMAGING AND PROCEDURES  Imaging and Procedures for 04/16/20  OCT, Retina - OU - Both Eyes       Right Eye Quality was good. Scan locations included subfoveal. Central Foveal Thickness: 471. Progression has worsened. Findings include abnormal foveal contour.   Left Eye Quality was good. Scan locations included subfoveal. Central Foveal Thickness: 208. Progression has been stable.   Notes [facility] perifoveal and subfoveal center involved CSME right eye, will need to deliver intravitreal Eylea OD today to resolve and then perhaps 1 day complete PRP and/or focal OD       Intravitreal Injection, Pharmacologic Agent - OD - Right Eye       Time Out 04/16/2020. 10:38 AM. Confirmed correct patient, procedure, site, and  patient consented.   Anesthesia Topical anesthesia was used. Anesthetic medications included Akten 3.5%.   Procedure Preparation included Ofloxacin , 10% betadine to  eyelids, 5% betadine to ocular surface. A 30 gauge needle was used.   Injection:  2 mg aflibercept Alfonse Flavors) SOLN   NDC: [id number], Lot: [id number]   Route: Intravitreal, Site: Right Eye, Waste: 0 mg  Post-op Post injection exam found visual acuity of at least counting fingers. The patient tolerated the procedure well. There were no complications. The patient received written and verbal post procedure care education. Post injection medications were not given.                 ASSESSMENT/PLAN:  Severe nonproliferative diabetic retinopathy of left eye, with macular edema, associated with type 2 diabetes mellitus (HCC) OS improved macular edema post recent Eylea, preserved acuity  Severe nonproliferative diabetic retinopathy of right eye, with macular edema, associated with type 2 diabetes mellitus ([place]) [facility] center involved CSME, will deliver intravitreal Eylea OD today, may need focal and/or PRP in the future      ICD-10-CM   1. Severe nonproliferative diabetic retinopathy of right eye, with macular edema, associated with type 2 diabetes mellitus (HCC)  E11.3411 OCT, Retina - OU - Both Eyes    Intravitreal Injection, Pharmacologic Agent - OD - Right Eye    aflibercept (EYLEA) SOLN 2 mg  2. Severe nonproliferative diabetic retinopathy of left eye, with macular edema, associated with type 2 diabetes mellitus ([place])  I77.8242     1.  We will deliver intravitreal Eylea OD today and follow-up in 6 weeks  2.  Follow-up dilated examination left eye as scheduled  3.  Ophthalmic Meds Ordered this visit:  Meds ordered this encounter  Medications  . aflibercept (EYLEA) SOLN 2 mg       Return in about 6 weeks (around 05/28/2020) for dilate, OD, EYLEA OCT,,, and follow-up 3 mos Dil OS Eylea and OCT.  There are  no Patient Instructions on file for this visit.   Explained the diagnoses, plan, and follow up with the patient and they expressed understanding.  Patient expressed understanding of the importance of proper follow up care.   Clent Demark Gokul Waybright M.D. Diseases & Surgery of the Retina and Vitreous Retina & Diabetic [place] 04/16/20     Abbreviations: M myopia (nearsighted); A astigmatism; H hyperopia (farsighted); P presbyopia; Mrx spectacle prescription;  CTL contact lenses; OD right eye; OS left eye; OU both eyes  XT exotropia; ET esotropia; PEK punctate epithelial keratitis; PEE punctate epithelial erosions; DES dry eye syndrome; MGD meibomian gland dysfunction; ATs artificial tears; PFAT's preservative free artificial tears; [place] nuclear sclerotic cataract; PSC posterior subcapsular cataract; ERM epi-retinal membrane; PVD posterior vitreous detachment; RD retinal detachment; DM diabetes mellitus; DR diabetic retinopathy; NPDR non-proliferative diabetic retinopathy; PDR proliferative diabetic retinopathy; CSME clinically significant macular edema; DME diabetic macular edema; dbh dot blot hemorrhages; CWS cotton wool spot; POAG primary open angle glaucoma; C/D cup-to-disc ratio; HVF humphrey visual field; GVF goldmann visual field; OCT optical coherence tomography; IOP intraocular pressure; BRVO Branch retinal vein occlusion; CRVO central retinal vein occlusion; CRAO central retinal artery occlusion; BRAO branch retinal artery occlusion; RT retinal tear; SB scleral buckle; PPV pars plana vitrectomy; VH Vitreous hemorrhage; PRP panretinal laser photocoagulation; IVK intravitreal kenalog; VMT vitreomacular traction; MH Macular hole;  NVD neovascularization of the disc; NVE neovascularization elsewhere; AREDS age related eye disease study; ARMD age related macular degeneration; POAG primary open angle glaucoma; EBMD epithelial/anterior basement membrane dystrophy; ACIOL anterior chamber intraocular lens; IOL  intraocular lens; PCIOL posterior chamber intraocular lens; Phaco/IOL phacoemulsification with intraocular lens placement; [place] photorefractive keratectomy;  LASIK laser assisted in situ keratomileusis; HTN hypertension; DM diabetes mellitus; COPD chronic obstructive pulmonary disease

## 2020-05-10 DIAGNOSIS — M150 Primary generalized (osteo)arthritis: Secondary | ICD-10-CM | POA: Diagnosis not present

## 2020-05-10 DIAGNOSIS — Z79899 Other long term (current) drug therapy: Secondary | ICD-10-CM | POA: Diagnosis not present

## 2020-05-10 DIAGNOSIS — M7060 Trochanteric bursitis, unspecified hip: Secondary | ICD-10-CM | POA: Diagnosis not present

## 2020-05-10 DIAGNOSIS — M810 Age-related osteoporosis without current pathological fracture: Secondary | ICD-10-CM | POA: Diagnosis not present

## 2020-05-10 DIAGNOSIS — L4050 Arthropathic psoriasis, unspecified: Secondary | ICD-10-CM | POA: Diagnosis not present

## 2020-05-10 DIAGNOSIS — E559 Vitamin D deficiency, unspecified: Secondary | ICD-10-CM | POA: Diagnosis not present

## 2020-05-10 DIAGNOSIS — M79643 Pain in unspecified hand: Secondary | ICD-10-CM | POA: Diagnosis not present

## 2020-05-10 DIAGNOSIS — D696 Thrombocytopenia, unspecified: Secondary | ICD-10-CM | POA: Diagnosis not present

## 2020-05-28 ENCOUNTER — Ambulatory Visit (INDEPENDENT_AMBULATORY_CARE_PROVIDER_SITE_OTHER): Payer: Medicare Other | Admitting: Ophthalmology

## 2020-05-28 ENCOUNTER — Encounter (INDEPENDENT_AMBULATORY_CARE_PROVIDER_SITE_OTHER): Payer: Self-pay | Admitting: Ophthalmology

## 2020-05-28 ENCOUNTER — Other Ambulatory Visit: Payer: Self-pay

## 2020-05-28 DIAGNOSIS — E113412 Type 2 diabetes mellitus with severe nonproliferative diabetic retinopathy with macular edema, left eye: Secondary | ICD-10-CM | POA: Diagnosis not present

## 2020-05-28 DIAGNOSIS — E113411 Type 2 diabetes mellitus with severe nonproliferative diabetic retinopathy with macular edema, right eye: Secondary | ICD-10-CM | POA: Diagnosis not present

## 2020-05-28 MED ORDER — AFLIBERCEPT 2MG/0.05ML IZ SOLN FOR KALEIDOSCOPE
2.0000 mg | INTRAVITREAL | Status: AC | PRN
  Administered 2020-05-28: 2 mg via INTRAVITREAL

## 2020-05-28 NOTE — Assessment & Plan Note (Signed)
OS, follow-up as scheduled

## 2020-05-28 NOTE — Progress Notes (Signed)
05/28/2020     CHIEF COMPLAINT Patient presents for Retina Follow Up (6 Week NPDR f\u OD. Possible Eylea OD. OCT/Pt states vision is about the same. Denies new complaints./BGL: 145)   HISTORY OF PRESENT ILLNESS: Shannon Craig is a 70 y.o. female who presents to the clinic today for:   HPI    Retina Follow Up    Patient presents with  Diabetic Retinopathy.  In right eye.  Severity is severe.  Duration of 6 weeks.  Since onset it is stable.  I, the attending physician,  performed the HPI with the patient and updated documentation appropriately. Additional comments: 6 Week NPDR f\u OD. Possible Eylea OD. OCT Pt states vision is about the same. Denies new complaints. BGL: 145       Last edited by Tilda Franco on 05/28/2020  9:33 AM. (History)      Referring physician: Merrilee Seashore, [address],  [place] [address]  HISTORICAL INFORMATION:   Selected notes from the [facility]: No current outpatient medications on file. (Ophthalmic Drugs)   No current facility-administered medications for this visit. (Ophthalmic Drugs)   Current Outpatient Medications (Other)  Medication Sig  . Albiglutide (TANZEUM) 50 MG PEN Inject into the skin.  . Alogliptin Benzoate (NESINA) 25 MG TABS Take by mouth.  Marland Kitchen atorvastatin (LIPITOR) 40 MG tablet Take 40 mg by mouth daily.  . Calcium-Vitamin D-Vitamin K (VIACTIV PO) Take 1 tablet by mouth daily.  . Canagliflozin (INVOKANA) 300 MG TABS Take 300 mg by mouth daily.  . Certolizumab Pegol (CIMZIA) 2 X 200 MG KIT Inject 1 mL into the skin.  Marland Kitchen diltiazem (CARDIZEM) 120 MG tablet Take by mouth.  . empagliflozin (JARDIANCE) 25 MG TABS tablet Take 25 mg by mouth daily.  Marland Kitchen HYDROcodone-acetaminophen (NORCO/VICODIN) 5-325 MG tablet Take 1 tablet by mouth every 6 (six) hours as needed.  . InFLIXimab (REMICADE IV) Inject into the vein.  Marland Kitchen insulin aspart protamine - aspart (NOVOLOG MIX 70/30  FLEXPEN) (70-30) 100 UNIT/ML FlexPen inject 25 units before breakfast and 10 units before dinner subcutaneously  . meloxicam (MOBIC) 15 MG tablet   . Multiple Vitamin (MULTIVITAMIN) tablet Take 1 tablet by mouth daily.  . pioglitazone (ACTOS) 15 MG tablet Take 15 mg by mouth daily.  . ramipril (ALTACE) 5 MG capsule Take 5 mg by mouth daily.  . traMADol (ULTRAM) 50 MG tablet Take 1 tablet (50 mg total) by mouth every 6 (six) hours as needed. (Patient not taking: Reported on 06/02/2019)   No current facility-administered medications for this visit. (Other)      REVIEW OF SYSTEMS: ROS    Positive for: Endocrine   Last edited by Tilda Franco on 05/28/2020  9:33 AM. (History)       ALLERGIES Allergies  Allergen Reactions  . Darvocet [Propoxyphene N-Acetaminophen]   . Gadolinium Derivatives Hives  . Methotrexate Derivatives   . Percodan [Oxycodone-Aspirin]     PAST MEDICAL HISTORY Past Medical History:  Diagnosis Date  . Diabetes mellitus ([place])   . Diverticula of colon   . External hemorrhoid   . Hypertension   . Internal hemorrhoid   . Kidney stones   . Liver nodule   . Nodule of kidney    Past Surgical History:  Procedure Laterality Date  . ABDOMINAL HYSTERECTOMY  1991  . BREAST REDUCTION SURGERY  1985  . HAND SURGERY  2001   left,  pins  . LASIK  2005   bilateral  . LITHOTRIPSY  1983    FAMILY HISTORY Family History  Problem Relation Age of Onset  . Breast cancer Mother   . Diabetes Father   . Prostate cancer Maternal Uncle   . Stroke Maternal Grandmother   . Heart attack Maternal Grandfather   . Diabetes Brother     SOCIAL HISTORY Social History   Tobacco Use  . Smoking status: Never Smoker  . Smokeless tobacco: Never Used  Substance Use Topics  . Alcohol use: No  . Drug use: No         OPHTHALMIC EXAM: Base Eye Exam    Visual Acuity (Snellen - Linear)      Right Left   Dist [place] 20/25 -1 20/30 -2       Tonometry (Tonopen, 9:38 AM)       Right Left   Pressure 14 12       Pupils      Pupils Dark Light Shape React APD   Right PERRL 3 2 Round Brisk None   Left PERRL 3 2 Round Brisk None       Visual Fields (Counting fingers)      Left Right    Full Full       Neuro/Psych    Oriented x3: Yes   Mood/Affect: Normal       Dilation    Right eye: 1.0% Mydriacyl, 2.5% Phenylephrine @ 9:38 AM        Slit Lamp and Fundus Exam    External Exam      Right Left   External Normal Normal       Slit Lamp Exam      Right Left   Lids/Lashes Normal Normal   Conjunctiva/Sclera White and quiet White and quiet   Cornea Clear Clear   Anterior Chamber Deep and quiet Deep and quiet   Iris Round and reactive Round and reactive   Lens Posterior chamber intraocular lens, Open posterior capsule Posterior chamber intraocular lens, Open posterior capsule   Anterior Vitreous Normal Normal       Fundus Exam      Right Left   Posterior Vitreous Posterior vitreous detachment    Disc Peripapillary atrophy    C/D Ratio 0.7    Macula Microaneurysms, Macular thickening, Hard drusen, Moderate clinically significant macular edema, Exudates    Vessels NPDR-Severe    Periphery Peripheral retinal nonperfusion, anterior nasal           IMAGING AND PROCEDURES  Imaging and Procedures for 05/28/20  OCT, Retina - OU - Both Eyes       Right Eye Quality was good. Scan locations included subfoveal. Central Foveal Thickness: 322. Progression has worsened. Findings include abnormal foveal contour.   Left Eye Quality was good. Scan locations included subfoveal. Central Foveal Thickness: 227. Progression has been stable. Findings include abnormal foveal contour.   Notes [facility] center involved CSME OD now 6 weeks post intravitreal Eylea, will repeat injection today and again in 8 weeks evaluate OS, no active CSME       Intravitreal Injection, Pharmacologic Agent - OD - Right Eye       Time Out 05/28/2020. 11:29 AM.  Confirmed correct patient, procedure, site, and patient consented.   Anesthesia Topical anesthesia was used. Anesthetic medications included Akten 3.5%.   Procedure Preparation included Ofloxacin , 10% betadine to eyelids, 5% betadine to ocular surface. A 30 gauge needle was used.  Injection:  2 mg aflibercept Alfonse Flavors) SOLN   NDC: [id number], Lot: [id number]   Route: Intravitreal, Site: Right Eye, Waste: 0 mg  Post-op Post injection exam found visual acuity of at least counting fingers. The patient tolerated the procedure well. There were no complications. The patient received written and verbal post procedure care education. Post injection medications were not given.                 ASSESSMENT/PLAN:  Severe nonproliferative diabetic retinopathy of right eye, with macular edema, associated with type 2 diabetes mellitus (HCC) Much improved status post intravitreal Eylea some 6 weeks previous we will repeat injection today  Severe nonproliferative diabetic retinopathy of left eye, with macular edema, associated with type 2 diabetes mellitus (HCC) OS, follow-up as scheduled      ICD-10-CM   1. Severe nonproliferative diabetic retinopathy of right eye, with macular edema, associated with type 2 diabetes mellitus (HCC)  E11.3411 OCT, Retina - OU - Both Eyes    Intravitreal Injection, Pharmacologic Agent - OD - Right Eye    aflibercept (EYLEA) SOLN 2 mg  2. Severe nonproliferative diabetic retinopathy of left eye, with macular edema, associated with type 2 diabetes mellitus ([place])  Y61.6837     1.  OD, overall improved post Eylea will repeat injection today  2.  OS follow-up as scheduled  3.  Ophthalmic Meds Ordered this visit:  Meds ordered this encounter  Medications  . aflibercept (EYLEA) SOLN 2 mg       Return in about 8 weeks (around 07/23/2020) for dilate, OD, EYLEA OCT.  There are no Patient Instructions on file for this visit.   Explained the diagnoses,  plan, and follow up with the patient and they expressed understanding.  Patient expressed understanding of the importance of proper follow up care.   Clent Demark Ahria Slappey M.D. Diseases & Surgery of the Retina and Vitreous Retina & Diabetic [place] 05/28/20     Abbreviations: M myopia (nearsighted); A astigmatism; H hyperopia (farsighted); P presbyopia; Mrx spectacle prescription;  CTL contact lenses; OD right eye; OS left eye; OU both eyes  XT exotropia; ET esotropia; PEK punctate epithelial keratitis; PEE punctate epithelial erosions; DES dry eye syndrome; MGD meibomian gland dysfunction; ATs artificial tears; PFAT's preservative free artificial tears; [place] nuclear sclerotic cataract; PSC posterior subcapsular cataract; ERM epi-retinal membrane; PVD posterior vitreous detachment; RD retinal detachment; DM diabetes mellitus; DR diabetic retinopathy; NPDR non-proliferative diabetic retinopathy; PDR proliferative diabetic retinopathy; CSME clinically significant macular edema; DME diabetic macular edema; dbh dot blot hemorrhages; CWS cotton wool spot; POAG primary open angle glaucoma; C/D cup-to-disc ratio; HVF humphrey visual field; GVF goldmann visual field; OCT optical coherence tomography; IOP intraocular pressure; BRVO Branch retinal vein occlusion; CRVO central retinal vein occlusion; CRAO central retinal artery occlusion; BRAO branch retinal artery occlusion; RT retinal tear; SB scleral buckle; PPV pars plana vitrectomy; VH Vitreous hemorrhage; PRP panretinal laser photocoagulation; IVK intravitreal kenalog; VMT vitreomacular traction; MH Macular hole;  NVD neovascularization of the disc; NVE neovascularization elsewhere; AREDS age related eye disease study; ARMD age related macular degeneration; POAG primary open angle glaucoma; EBMD epithelial/anterior basement membrane dystrophy; ACIOL anterior chamber intraocular lens; IOL intraocular lens; PCIOL posterior chamber intraocular lens; Phaco/IOL  phacoemulsification with intraocular lens placement; [place] photorefractive keratectomy; LASIK laser assisted in situ keratomileusis; HTN hypertension; DM diabetes mellitus; COPD chronic obstructive pulmonary disease

## 2020-05-28 NOTE — Assessment & Plan Note (Signed)
Much improved status post intravitreal Eylea some 6 weeks previous we will repeat injection today

## 2020-06-07 DIAGNOSIS — L4050 Arthropathic psoriasis, unspecified: Secondary | ICD-10-CM | POA: Diagnosis not present

## 2020-06-18 ENCOUNTER — Other Ambulatory Visit (HOSPITAL_COMMUNITY): Payer: Self-pay

## 2020-06-18 MED ORDER — MOLNUPIRAVIR 200 MG PO CAPS
ORAL_CAPSULE | ORAL | 0 refills | Status: DC
  Filled 2020-06-18: qty 40, 5d supply, fill #0

## 2020-06-19 ENCOUNTER — Other Ambulatory Visit (HOSPITAL_COMMUNITY): Payer: Self-pay

## 2020-07-05 DIAGNOSIS — L4050 Arthropathic psoriasis, unspecified: Secondary | ICD-10-CM | POA: Diagnosis not present

## 2020-07-16 ENCOUNTER — Other Ambulatory Visit: Payer: Self-pay

## 2020-07-16 ENCOUNTER — Ambulatory Visit (INDEPENDENT_AMBULATORY_CARE_PROVIDER_SITE_OTHER): Payer: Medicare Other | Admitting: Ophthalmology

## 2020-07-16 ENCOUNTER — Encounter (INDEPENDENT_AMBULATORY_CARE_PROVIDER_SITE_OTHER): Payer: Self-pay | Admitting: Ophthalmology

## 2020-07-16 DIAGNOSIS — E113411 Type 2 diabetes mellitus with severe nonproliferative diabetic retinopathy with macular edema, right eye: Secondary | ICD-10-CM | POA: Diagnosis not present

## 2020-07-16 DIAGNOSIS — E113412 Type 2 diabetes mellitus with severe nonproliferative diabetic retinopathy with macular edema, left eye: Secondary | ICD-10-CM

## 2020-07-16 MED ORDER — AFLIBERCEPT 2MG/0.05ML IZ SOLN FOR KALEIDOSCOPE
2.0000 mg | INTRAVITREAL | Status: AC | PRN
  Administered 2020-07-16: 2 mg via INTRAVITREAL

## 2020-07-16 NOTE — Progress Notes (Signed)
07/16/2020     CHIEF COMPLAINT Patient presents for Retina Follow Up (3 month fu OS and Eylea OS/Pt states, "Overall I feel like I am not seeing as well. I feel like it has a film over both of my eyes. Very cloudy."/A1C:8.2/LBS:123)   HISTORY OF PRESENT ILLNESS: Racquel J Craig is a 70 y.o. female who presents to the clinic today for:   HPI    Retina Follow Up    Diagnosis: Diabetic Retinopathy   Laterality: left eye   Onset: 3 months ago   Severity: mild   Duration: 3 months   Course: gradually worsening   Comments: 3 month fu OS and Eylea OS Pt states, "Overall I feel like I am not seeing as well. I feel like it has a film over both of my eyes. Very cloudy." A1C:8.2 LBS:123          Comments    History of diabetic CSME OU.       Last edited by Hurman Horn, MD on 07/16/2020 10:33 AM. (History)      Referring physician: Merrilee Seashore, MD [place],  [place] [address]  HISTORICAL INFORMATION:   Selected notes from the [facility]: No current outpatient medications on file. (Ophthalmic Drugs)   No current facility-administered medications for this visit. (Ophthalmic Drugs)   Current Outpatient Medications (Other)  Medication Sig  . Albiglutide (TANZEUM) 50 MG PEN Inject into the skin.  . Alogliptin Benzoate (NESINA) 25 MG TABS Take by mouth.  Marland Kitchen atorvastatin (LIPITOR) 40 MG tablet Take 40 mg by mouth daily.  . Calcium-Vitamin D-Vitamin K (VIACTIV PO) Take 1 tablet by mouth daily.  . Canagliflozin (INVOKANA) 300 MG TABS Take 300 mg by mouth daily.  . Certolizumab Pegol (CIMZIA) 2 X 200 MG KIT Inject 1 mL into the skin.  Marland Kitchen diltiazem (CARDIZEM) 120 MG tablet Take by mouth.  . empagliflozin (JARDIANCE) 25 MG TABS tablet Take 25 mg by mouth daily.  Marland Kitchen HYDROcodone-acetaminophen (NORCO/VICODIN) 5-325 MG tablet Take 1 tablet by mouth every 6 (six) hours as needed.  . InFLIXimab (REMICADE IV) Inject into  the vein.  Marland Kitchen insulin aspart protamine - aspart (NOVOLOG MIX 70/30 FLEXPEN) (70-30) 100 UNIT/ML FlexPen inject 25 units before breakfast and 10 units before dinner subcutaneously  . meloxicam (MOBIC) 15 MG tablet   . Molnupiravir 200 MG CAPS Take 4 capules by mouth every 12 hours  . Multiple Vitamin (MULTIVITAMIN) tablet Take 1 tablet by mouth daily.  . pioglitazone (ACTOS) 15 MG tablet Take 15 mg by mouth daily.  . ramipril (ALTACE) 5 MG capsule Take 5 mg by mouth daily.  . traMADol (ULTRAM) 50 MG tablet Take 1 tablet (50 mg total) by mouth every 6 (six) hours as needed. (Patient not taking: Reported on 06/02/2019)   No current facility-administered medications for this visit. (Other)      REVIEW OF SYSTEMS:    ALLERGIES Allergies  Allergen Reactions  . Darvocet [Propoxyphene N-Acetaminophen]   . Gadolinium Derivatives Hives  . Methotrexate Derivatives   . Percodan [Oxycodone-Aspirin]     PAST MEDICAL HISTORY Past Medical History:  Diagnosis Date  . Diabetes mellitus ([place])   . Diverticula of colon   . External hemorrhoid   . Hypertension   . Internal hemorrhoid   . Kidney stones   . Liver nodule   . Nodule of kidney    Past Surgical History:  Procedure Laterality  Date  . ABDOMINAL HYSTERECTOMY  1991  . BREAST REDUCTION SURGERY  1985  . HAND SURGERY  2001   left, pins  . LASIK  2005   bilateral  . LITHOTRIPSY  1983    FAMILY HISTORY Family History  Problem Relation Age of Onset  . Breast cancer Mother   . Diabetes Father   . Prostate cancer Maternal Uncle   . Stroke Maternal Grandmother   . Heart attack Maternal Grandfather   . Diabetes Brother     SOCIAL HISTORY Social History   Tobacco Use  . Smoking status: Never Smoker  . Smokeless tobacco: Never Used  Substance Use Topics  . Alcohol use: No  . Drug use: No         OPHTHALMIC EXAM: Base Eye Exam    Visual Acuity (ETDRS)      Right Left   Dist [place] 20/25 -1 20/60 -1   Dist ph [place] NI 20/50  -2       Tonometry (Tonopen, 9:44 AM)      Right Left   Pressure 14 14       Pupils      Pupils Dark Light Shape React APD   Right PERRL 3 2 Round Brisk None   Left PERRL 3 2 Round Brisk None       Visual Fields      Left Right    Full    Restrictions  Partial outer inferior temporal deficiency       Neuro/Psych    Oriented x3: Yes   Mood/Affect: Normal       Dilation    Left eye: 1.0% Mydriacyl, 2.5% Phenylephrine @ 9:44 AM        Slit Lamp and Fundus Exam    External Exam      Right Left   External Normal Normal       Slit Lamp Exam      Right Left   Lids/Lashes Normal Normal   Conjunctiva/Sclera White and quiet White and quiet   Cornea Clear Clear   Anterior Chamber Deep and quiet Deep and quiet   Iris Round and reactive Round and reactive   Lens Posterior chamber intraocular lens, Open posterior capsule Posterior chamber intraocular lens, Open posterior capsule   Anterior Vitreous Normal Normal       Fundus Exam      Right Left   Posterior Vitreous  Normal   Disc  Normal   C/D Ratio  0.5   Macula  No Macular thickening, focal clinically significant macular edema, superior to the FAZ region, exudates, Microaneurysms, Mild clinically significant macular edema   Vessels  Retinopathy, severe nonproliferative diabetic retinopathy with limited peripheral PRP room temporally and superiorly for more laser   Periphery  Peripheral retinal nonperfusion nasal and inferior          IMAGING AND PROCEDURES  Imaging and Procedures for 07/16/20  OCT, Retina - OU - Both Eyes       Right Eye Quality was good. Scan locations included subfoveal. Central Foveal Thickness: 270. Progression has improved. Findings include abnormal foveal contour.   Left Eye Quality was good. Scan locations included subfoveal. Central Foveal Thickness: 370. Progression has worsened. Findings include abnormal foveal contour.   Notes Vastly improved center involved CSME OD now 7  weeks post intravitreal Eylea, examination OD next as scheduled  OS with worsening of CSME off of therapy now in the left eye for some 3 months.  Need repeat injection  today and may consider angiographic delivery guided peripheral PRP to decrease vegF burden in the future       Intravitreal Injection, Pharmacologic Agent - OS - Left Eye       Time Out 07/16/2020. 10:36 AM. Confirmed correct patient, procedure, site, and patient consented.   Anesthesia Topical anesthesia was used. Anesthetic medications included Akten 3.5%.   Procedure Preparation included 5% betadine to ocular surface, 10% betadine to eyelids, Ofloxacin . A 30 gauge needle was used.   Injection:  2 mg aflibercept Alfonse Flavors) SOLN   NDC: [id number], Lot: [id number]   Route: Intravitreal, Site: Left Eye, Waste: 0 mg  Post-op Post injection exam found visual acuity of at least counting fingers. The patient tolerated the procedure well. There were no complications. The patient received written and verbal post procedure care education. Post injection medications were not given.                 ASSESSMENT/PLAN:  Severe nonproliferative diabetic retinopathy of right eye, with macular edema, associated with type 2 diabetes mellitus (HCC) Overall improved CSME OD at 7-week follow-up.  Schedule visit in the future for assessment of CSME control next  Severe nonproliferative diabetic retinopathy of left eye, with macular edema, associated with type 2 diabetes mellitus (HCC) OS, will need repeat injection today intravitreal Eylea at 87-monthinterval.  Peripheral PRP may to be needed in the future to decrease vegF burden      ICD-10-CM   1. Severe nonproliferative diabetic retinopathy of left eye, with macular edema, associated with type 2 diabetes mellitus (HCC)  EQ59.5638OCT, Retina - OU - Both Eyes    Intravitreal Injection, Pharmacologic Agent - OS - Left Eye    aflibercept (EYLEA) SOLN 2 mg  2. Severe  nonproliferative diabetic retinopathy of right eye, with macular edema, associated with type 2 diabetes mellitus ([place]  E11.3411     1.  OS will need repeat intravitreal Eylea currently at 368-monthollow-up and shorten interval of examination and delivery of entire vegF in future if needed  Will also examine peripheral retinal perfusion next visit and possible addition of peripheral PRP to decrease vegF burden OS  2.  OD, follow-up as scheduled  3.  Ophthalmic Meds Ordered this visit:  Meds ordered this encounter  Medications  . aflibercept (EYLEA) SOLN 2 mg       Return in about 6 weeks (around 08/27/2020) for DILATE OU, OPTOS FFA L/R, COLOR FP, PRP, OS.  There are no Patient Instructions on file for this visit.   Explained the diagnoses, plan, and follow up with the patient and they expressed understanding.  Patient expressed understanding of the importance of proper follow up care.   GaClent Demarkankin M.D. Diseases & Surgery of the Retina and Vitreous Retina & Diabetic [place]5/23/22     Abbreviations: M myopia (nearsighted); A astigmatism; H hyperopia (farsighted); P presbyopia; Mrx spectacle prescription;  CTL contact lenses; OD right eye; OS left eye; OU both eyes  XT exotropia; ET esotropia; PEK punctate epithelial keratitis; PEE punctate epithelial erosions; DES dry eye syndrome; MGD meibomian gland dysfunction; ATs artificial tears; PFAT's preservative free artificial tears; NSPeoriauclear sclerotic cataract; PSC posterior subcapsular cataract; ERM epi-retinal membrane; PVD posterior vitreous detachment; RD retinal detachment; DM diabetes mellitus; DR diabetic retinopathy; NPDR non-proliferative diabetic retinopathy; PDR proliferative diabetic retinopathy; CSME clinically significant macular edema; DME diabetic macular edema; dbh dot blot hemorrhages; CWS cotton wool spot; POAG primary open angle glaucoma; C/D cup-to-disc ratio;  HVF humphrey visual field; GVF goldmann visual  field; OCT optical coherence tomography; IOP intraocular pressure; BRVO Branch retinal vein occlusion; CRVO central retinal vein occlusion; CRAO central retinal artery occlusion; BRAO branch retinal artery occlusion; RT retinal tear; SB scleral buckle; PPV pars plana vitrectomy; VH Vitreous hemorrhage; PRP panretinal laser photocoagulation; IVK intravitreal kenalog; VMT vitreomacular traction; MH Macular hole;  NVD neovascularization of the disc; NVE neovascularization elsewhere; AREDS age related eye disease study; ARMD age related macular degeneration; POAG primary open angle glaucoma; EBMD epithelial/anterior basement membrane dystrophy; ACIOL anterior chamber intraocular lens; IOL intraocular lens; PCIOL posterior chamber intraocular lens; Phaco/IOL phacoemulsification with intraocular lens placement; [place] photorefractive keratectomy; LASIK laser assisted in situ keratomileusis; HTN hypertension; DM diabetes mellitus; COPD chronic obstructive pulmonary disease

## 2020-07-16 NOTE — Assessment & Plan Note (Signed)
OS, will need repeat injection today intravitreal Eylea at 18-month interval.  Peripheral PRP may to be needed in the future to decrease vegF burden

## 2020-07-16 NOTE — Assessment & Plan Note (Signed)
Overall improved CSME OD at 7-week follow-up.  Schedule visit in the future for assessment of CSME control next

## 2020-07-24 ENCOUNTER — Encounter (INDEPENDENT_AMBULATORY_CARE_PROVIDER_SITE_OTHER): Payer: Self-pay | Admitting: Ophthalmology

## 2020-07-24 ENCOUNTER — Other Ambulatory Visit: Payer: Self-pay

## 2020-07-24 ENCOUNTER — Ambulatory Visit (INDEPENDENT_AMBULATORY_CARE_PROVIDER_SITE_OTHER): Payer: Medicare Other | Admitting: Ophthalmology

## 2020-07-24 DIAGNOSIS — E113411 Type 2 diabetes mellitus with severe nonproliferative diabetic retinopathy with macular edema, right eye: Secondary | ICD-10-CM

## 2020-07-24 DIAGNOSIS — E113412 Type 2 diabetes mellitus with severe nonproliferative diabetic retinopathy with macular edema, left eye: Secondary | ICD-10-CM

## 2020-07-24 MED ORDER — AFLIBERCEPT 2MG/0.05ML IZ SOLN FOR KALEIDOSCOPE
2.0000 mg | INTRAVITREAL | Status: AC | PRN
  Administered 2020-07-24: 2 mg via INTRAVITREAL

## 2020-07-24 NOTE — Progress Notes (Signed)
07/24/2020     CHIEF COMPLAINT Patient presents for Retina Follow Up (8 Wk F/U OD, poss Eylea OD//Pt denies noticeable changes to [place] OU since last visit. Pt denies ocular pain, flashes of light, or floaters OU. /LBS: 115 this AM)   HISTORY OF PRESENT ILLNESS: Shannon Craig is a 70 y.o. female who presents to the clinic today for:   HPI    Retina Follow Up    Diagnosis: Diabetic Retinopathy   Laterality: right eye   Onset: 8 weeks ago   Severity: mild   Duration: 8 weeks   Course: stable   Comments: 8 Wk F/U OD, poss Eylea OD  Pt denies noticeable changes to [place] OU since last visit. Pt denies ocular pain, flashes of light, or floaters OU.  LBS: 115 this AM       Last edited by Ileana Roup, COA on 07/24/2020  9:39 AM. (History)      Referring physician: Georgianne Fick, MD [address] [place],  [place] [address]  HISTORICAL INFORMATION:   Selected notes from the MEDICAL RECORD NUMBER       CURRENT MEDICATIONS: No current outpatient medications on file. (Ophthalmic Drugs)   No current facility-administered medications for this visit. (Ophthalmic Drugs)   Current Outpatient Medications (Other)  Medication Sig  . Albiglutide (TANZEUM) 50 MG PEN Inject into the skin.  . Alogliptin Benzoate (NESINA) 25 MG TABS Take by mouth.  Marland Kitchen atorvastatin (LIPITOR) 40 MG tablet Take 40 mg by mouth daily.  . Calcium-Vitamin D-Vitamin K (VIACTIV PO) Take 1 tablet by mouth daily.  . Canagliflozin (INVOKANA) 300 MG TABS Take 300 mg by mouth daily.  . Certolizumab Pegol (CIMZIA) 2 X 200 MG KIT Inject 1 mL into the skin.  Marland Kitchen diltiazem (CARDIZEM) 120 MG tablet Take by mouth.  . empagliflozin (JARDIANCE) 25 MG TABS tablet Take 25 mg by mouth daily.  Marland Kitchen HYDROcodone-acetaminophen (NORCO/VICODIN) 5-325 MG tablet Take 1 tablet by mouth every 6 (six) hours as needed.  . InFLIXimab (REMICADE IV) Inject into the vein.  Marland Kitchen insulin aspart protamine - aspart (NOVOLOG MIX 70/30  FLEXPEN) (70-30) 100 UNIT/ML FlexPen inject 25 units before breakfast and 10 units before dinner subcutaneously  . meloxicam (MOBIC) 15 MG tablet   . Molnupiravir 200 MG CAPS Take 4 capules by mouth every 12 hours  . Multiple Vitamin (MULTIVITAMIN) tablet Take 1 tablet by mouth daily.  . pioglitazone (ACTOS) 15 MG tablet Take 15 mg by mouth daily.  . ramipril (ALTACE) 5 MG capsule Take 5 mg by mouth daily.  . traMADol (ULTRAM) 50 MG tablet Take 1 tablet (50 mg total) by mouth every 6 (six) hours as needed. (Patient not taking: Reported on 06/02/2019)   No current facility-administered medications for this visit. (Other)      REVIEW OF SYSTEMS:    ALLERGIES Allergies  Allergen Reactions  . Darvocet [Propoxyphene N-Acetaminophen]   . Gadolinium Derivatives Hives  . Methotrexate Derivatives   . Percodan [Oxycodone-Aspirin]     PAST MEDICAL HISTORY Past Medical History:  Diagnosis Date  . Diabetes mellitus (HCC)   . Diverticula of colon   . External hemorrhoid   . Hypertension   . Internal hemorrhoid   . Kidney stones   . Liver nodule   . Nodule of kidney    Past Surgical History:  Procedure Laterality Date  . ABDOMINAL HYSTERECTOMY  1991  . BREAST REDUCTION SURGERY  1985  . HAND SURGERY  2001   left,  pins  . LASIK  2005   bilateral  . LITHOTRIPSY  1983    FAMILY HISTORY Family History  Problem Relation Age of Onset  . Breast cancer Mother   . Diabetes Father   . Prostate cancer Maternal Uncle   . Stroke Maternal Grandmother   . Heart attack Maternal Grandfather   . Diabetes Brother     SOCIAL HISTORY Social History   Tobacco Use  . Smoking status: Never Smoker  . Smokeless tobacco: Never Used  Substance Use Topics  . Alcohol use: No  . Drug use: No         OPHTHALMIC EXAM: Base Eye Exam    Visual Acuity (ETDRS)      Right Left   Dist [place] 20/20 -1 20/25 -1       Tonometry (Tonopen, 9:43 AM)      Right Left   Pressure 18 14       Pupils       Pupils Dark Light Shape React APD   Right PERRL 3 2 Round Brisk None   Left PERRL 3 2 Round Brisk None       Visual Fields (Counting fingers)      Left Right    Full    Restrictions  Partial outer inferior temporal deficiency       Extraocular Movement      Right Left    Full Full       Neuro/Psych    Oriented x3: Yes   Mood/Affect: Normal       Dilation    Right eye: 1.0% Mydriacyl, 2.5% Phenylephrine @ 9:43 AM        Slit Lamp and Fundus Exam    External Exam      Right Left   External Normal Normal       Slit Lamp Exam      Right Left   Lids/Lashes Normal Normal   Conjunctiva/Sclera White and quiet White and quiet   Cornea Clear Clear   Anterior Chamber Deep and quiet Deep and quiet   Iris Round and reactive Round and reactive   Lens Posterior chamber intraocular lens, Open posterior capsule Posterior chamber intraocular lens, Open posterior capsule   Anterior Vitreous Normal Normal       Fundus Exam      Right Left   Posterior Vitreous Posterior vitreous detachment    Disc Peripapillary atrophy    C/D Ratio 0.7    Macula Microaneurysms, Macular thickening, Hard drusen, Moderate clinically significant macular edema, Exudates    Vessels NPDR-Severe    Periphery Peripheral retinal nonperfusion, good PRP superiorly and nasally, room inferiorly for more PRP           IMAGING AND PROCEDURES  Imaging and Procedures for 07/24/20  OCT, Retina - OU - Both Eyes       Right Eye Quality was good. Scan locations included subfoveal. Central Foveal Thickness: 315. Progression has improved. Findings include abnormal foveal contour, cystoid macular edema.   Left Eye Quality was good. Scan locations included subfoveal. Central Foveal Thickness: 246. Progression has improved. Findings include abnormal foveal contour, cystoid macular edema.   Notes Vastly improved center involved CSME OD now 8 weeks post intravitreal Eylea, examination OD next as  scheduled  OS with worsening of CSME off of therapy now in the left eye for some 3 months.  Improved 1 week post recent injection left eye       Intravitreal Injection, Pharmacologic Agent - OD -  Right Eye       Time Out 07/24/2020. 10:32 AM. Confirmed correct patient, procedure, site, and patient consented.   Anesthesia Topical anesthesia was used. Anesthetic medications included Akten 3.5%.   Procedure Preparation included Ofloxacin , 10% betadine to eyelids, 5% betadine to ocular surface. A 30 gauge needle was used.   Injection:  2 mg aflibercept Alfonse Flavors) SOLN   NDC: [id number], Lot: [id number]   Route: Intravitreal, Site: Right Eye, Waste: 0 mg  Post-op Post injection exam found visual acuity of at least counting fingers. The patient tolerated the procedure well. There were no complications. The patient received written and verbal post procedure care education. Post injection medications were not given.                 ASSESSMENT/PLAN:  Severe nonproliferative diabetic retinopathy of left eye, with macular edema, associated with type 2 diabetes mellitus (HCC) CSME OS much improved 1 week post recent injection into vegF  Severe nonproliferative diabetic retinopathy of right eye, with macular edema, associated with type 2 diabetes mellitus ([place]) At 8-week follow-up, CSME overall improved.  Repeat intravitreal Eylea today and examination next in 8 weeks      ICD-10-CM   1. Severe nonproliferative diabetic retinopathy of right eye, with macular edema, associated with type 2 diabetes mellitus (HCC)  E11.3411 OCT, Retina - OU - Both Eyes    Intravitreal Injection, Pharmacologic Agent - OD - Right Eye    aflibercept (EYLEA) SOLN 2 mg  2. Severe nonproliferative diabetic retinopathy of left eye, with macular edema, associated with type 2 diabetes mellitus ([place])  I32.5498     1.  CSME OD, improved overall but still active.  Will need repeat injection OD today And  examination next in 8 weeks.  2.  Severe NPDR OD may still need completion of PRP inferiorly to decrease vegF load in the future  3.  Dilate OS next as scheduled  Ophthalmic Meds Ordered this visit:  Meds ordered this encounter  Medications  . aflibercept (EYLEA) SOLN 2 mg       Return in about 8 weeks (around 09/18/2020) for dilate, OD, EYLEA OCT,,,, and follow-up OS as scheduled.  There are no Patient Instructions on file for this visit.   Explained the diagnoses, plan, and follow up with the patient and they expressed understanding.  Patient expressed understanding of the importance of proper follow up care.   Clent Demark Ieesha Abbasi M.D. Diseases & Surgery of the Retina and Vitreous Retina & Diabetic [place] 07/24/20     Abbreviations: M myopia (nearsighted); A astigmatism; H hyperopia (farsighted); P presbyopia; Mrx spectacle prescription;  CTL contact lenses; OD right eye; OS left eye; OU both eyes  XT exotropia; ET esotropia; PEK punctate epithelial keratitis; PEE punctate epithelial erosions; DES dry eye syndrome; MGD meibomian gland dysfunction; ATs artificial tears; PFAT's preservative free artificial tears; [place] nuclear sclerotic cataract; PSC posterior subcapsular cataract; ERM epi-retinal membrane; PVD posterior vitreous detachment; RD retinal detachment; DM diabetes mellitus; DR diabetic retinopathy; NPDR non-proliferative diabetic retinopathy; PDR proliferative diabetic retinopathy; CSME clinically significant macular edema; DME diabetic macular edema; dbh dot blot hemorrhages; CWS cotton wool spot; POAG primary open angle glaucoma; C/D cup-to-disc ratio; HVF humphrey visual field; GVF goldmann visual field; OCT optical coherence tomography; IOP intraocular pressure; BRVO Branch retinal vein occlusion; CRVO central retinal vein occlusion; CRAO central retinal artery occlusion; BRAO branch retinal artery occlusion; RT retinal tear; SB scleral buckle; PPV pars plana vitrectomy; VH  Vitreous hemorrhage; PRP  panretinal laser photocoagulation; IVK intravitreal kenalog; VMT vitreomacular traction; MH Macular hole;  NVD neovascularization of the disc; NVE neovascularization elsewhere; AREDS age related eye disease study; ARMD age related macular degeneration; POAG primary open angle glaucoma; EBMD epithelial/anterior basement membrane dystrophy; ACIOL anterior chamber intraocular lens; IOL intraocular lens; PCIOL posterior chamber intraocular lens; Phaco/IOL phacoemulsification with intraocular lens placement; [place] photorefractive keratectomy; LASIK laser assisted in situ keratomileusis; HTN hypertension; DM diabetes mellitus; COPD chronic obstructive pulmonary disease

## 2020-07-24 NOTE — Assessment & Plan Note (Signed)
At 8-week follow-up, CSME overall improved.  Repeat intravitreal Eylea today and examination next in 8 weeks

## 2020-07-24 NOTE — Assessment & Plan Note (Signed)
CSME OS much improved 1 week post recent injection into vegF

## 2020-07-26 DIAGNOSIS — E782 Mixed hyperlipidemia: Secondary | ICD-10-CM | POA: Diagnosis not present

## 2020-07-26 DIAGNOSIS — D3500 Benign neoplasm of unspecified adrenal gland: Secondary | ICD-10-CM | POA: Diagnosis not present

## 2020-07-26 DIAGNOSIS — E1169 Type 2 diabetes mellitus with other specified complication: Secondary | ICD-10-CM | POA: Diagnosis not present

## 2020-07-26 DIAGNOSIS — I10 Essential (primary) hypertension: Secondary | ICD-10-CM | POA: Diagnosis not present

## 2020-07-26 DIAGNOSIS — Z9641 Presence of insulin pump (external) (internal): Secondary | ICD-10-CM | POA: Diagnosis not present

## 2020-07-31 DIAGNOSIS — M79672 Pain in left foot: Secondary | ICD-10-CM | POA: Diagnosis not present

## 2020-08-02 DIAGNOSIS — L4050 Arthropathic psoriasis, unspecified: Secondary | ICD-10-CM | POA: Diagnosis not present

## 2020-08-13 DIAGNOSIS — M7989 Other specified soft tissue disorders: Secondary | ICD-10-CM | POA: Diagnosis not present

## 2020-08-13 DIAGNOSIS — M722 Plantar fascial fibromatosis: Secondary | ICD-10-CM | POA: Diagnosis not present

## 2020-08-13 DIAGNOSIS — M79672 Pain in left foot: Secondary | ICD-10-CM | POA: Diagnosis not present

## 2020-08-14 DIAGNOSIS — M150 Primary generalized (osteo)arthritis: Secondary | ICD-10-CM | POA: Diagnosis not present

## 2020-08-14 DIAGNOSIS — Z79899 Other long term (current) drug therapy: Secondary | ICD-10-CM | POA: Diagnosis not present

## 2020-08-14 DIAGNOSIS — M79643 Pain in unspecified hand: Secondary | ICD-10-CM | POA: Diagnosis not present

## 2020-08-14 DIAGNOSIS — L4050 Arthropathic psoriasis, unspecified: Secondary | ICD-10-CM | POA: Diagnosis not present

## 2020-08-14 DIAGNOSIS — E559 Vitamin D deficiency, unspecified: Secondary | ICD-10-CM | POA: Diagnosis not present

## 2020-08-14 DIAGNOSIS — M7060 Trochanteric bursitis, unspecified hip: Secondary | ICD-10-CM | POA: Diagnosis not present

## 2020-08-14 DIAGNOSIS — M810 Age-related osteoporosis without current pathological fracture: Secondary | ICD-10-CM | POA: Diagnosis not present

## 2020-08-14 DIAGNOSIS — D696 Thrombocytopenia, unspecified: Secondary | ICD-10-CM | POA: Diagnosis not present

## 2020-08-27 DIAGNOSIS — Z20822 Contact with and (suspected) exposure to covid-19: Secondary | ICD-10-CM | POA: Diagnosis not present

## 2020-08-28 ENCOUNTER — Encounter (INDEPENDENT_AMBULATORY_CARE_PROVIDER_SITE_OTHER): Payer: Self-pay | Admitting: Ophthalmology

## 2020-08-28 ENCOUNTER — Ambulatory Visit (INDEPENDENT_AMBULATORY_CARE_PROVIDER_SITE_OTHER): Payer: Medicare Other | Admitting: Ophthalmology

## 2020-08-28 ENCOUNTER — Other Ambulatory Visit: Payer: Self-pay

## 2020-08-28 DIAGNOSIS — E782 Mixed hyperlipidemia: Secondary | ICD-10-CM | POA: Diagnosis not present

## 2020-08-28 DIAGNOSIS — E1169 Type 2 diabetes mellitus with other specified complication: Secondary | ICD-10-CM | POA: Diagnosis not present

## 2020-08-28 DIAGNOSIS — D3500 Benign neoplasm of unspecified adrenal gland: Secondary | ICD-10-CM | POA: Diagnosis not present

## 2020-08-28 DIAGNOSIS — E113412 Type 2 diabetes mellitus with severe nonproliferative diabetic retinopathy with macular edema, left eye: Secondary | ICD-10-CM | POA: Diagnosis not present

## 2020-08-28 DIAGNOSIS — D696 Thrombocytopenia, unspecified: Secondary | ICD-10-CM | POA: Diagnosis not present

## 2020-08-28 DIAGNOSIS — E113411 Type 2 diabetes mellitus with severe nonproliferative diabetic retinopathy with macular edema, right eye: Secondary | ICD-10-CM | POA: Diagnosis not present

## 2020-08-28 DIAGNOSIS — I700 Atherosclerosis of aorta: Secondary | ICD-10-CM | POA: Diagnosis not present

## 2020-08-28 DIAGNOSIS — I10 Essential (primary) hypertension: Secondary | ICD-10-CM | POA: Diagnosis not present

## 2020-08-28 MED ORDER — FLUORESCEIN SODIUM 10 % IV SOLN
500.0000 mg | INTRAVENOUS | Status: AC | PRN
  Administered 2020-08-28: 500 mg via INTRAVENOUS

## 2020-08-28 NOTE — Progress Notes (Signed)
08/28/2020     CHIEF COMPLAINT Patient presents for Retina Follow Up (6 week fu OU and OCT fp/ffa l/r prp OS/Pt states VA OU stable since last visit. Pt denies FOL, floaters, or ocular pain OU. Karie [place]: 9.0/LBS: 132/)   HISTORY OF PRESENT ILLNESS: Shannon Craig is a 70 y.o. female who presents to the clinic today for:   HPI     Retina Follow Up           Diagnosis: Diabetic Retinopathy   Laterality: both eyes   Onset: 6 weeks ago   Severity: mild   Duration: 6 weeks   Course: stable   Comments: 6 week fu OU and OCT fp/ffa l/r prp OS Pt states VA OU stable since last visit. Pt denies FOL, floaters, or ocular pain OU.  A1C: 9.0 LBS: 132          Comments   Plan evaluation of retinal nonperfusion peripherally OS and planned PRP delivered superiorly to decrease vegF burden long-term      Last edited by Hurman Horn, MD on 08/28/2020 11:27 AM.      Referring physician: Merrilee Seashore, MD [address] [place],  [place] [address]  HISTORICAL INFORMATION:   Selected notes from the [facility]: No current outpatient medications on file. (Ophthalmic Drugs)   No current facility-administered medications for this visit. (Ophthalmic Drugs)   Current Outpatient Medications (Other)  Medication Sig   Albiglutide (TANZEUM) 50 MG PEN Inject into the skin.   Alogliptin Benzoate (NESINA) 25 MG TABS Take by mouth.   atorvastatin (LIPITOR) 40 MG tablet Take 40 mg by mouth daily.   Calcium-Vitamin D-Vitamin K (VIACTIV PO) Take 1 tablet by mouth daily.   Canagliflozin (INVOKANA) 300 MG TABS Take 300 mg by mouth daily.   Certolizumab Pegol (CIMZIA) 2 X 200 MG KIT Inject 1 mL into the skin.   diltiazem (CARDIZEM) 120 MG tablet Take by mouth.   empagliflozin (JARDIANCE) 25 MG TABS tablet Take 25 mg by mouth daily.   HYDROcodone-acetaminophen (NORCO/VICODIN) 5-325 MG tablet Take 1 tablet by mouth every 6 (six) hours as needed.    InFLIXimab (REMICADE IV) Inject into the vein.   insulin aspart protamine - aspart (NOVOLOG MIX 70/30 FLEXPEN) (70-30) 100 UNIT/ML FlexPen inject 25 units before breakfast and 10 units before dinner subcutaneously   meloxicam (MOBIC) 15 MG tablet    Molnupiravir 200 MG CAPS Take 4 capules by mouth every 12 hours   Multiple Vitamin (MULTIVITAMIN) tablet Take 1 tablet by mouth daily.   pioglitazone (ACTOS) 15 MG tablet Take 15 mg by mouth daily.   ramipril (ALTACE) 5 MG capsule Take 5 mg by mouth daily.   traMADol (ULTRAM) 50 MG tablet Take 1 tablet (50 mg total) by mouth every 6 (six) hours as needed. (Patient not taking: Reported on 06/02/2019)   No current facility-administered medications for this visit. (Other)      REVIEW OF SYSTEMS:    ALLERGIES Allergies  Allergen Reactions   Darvocet [Propoxyphene N-Acetaminophen]    Gadolinium Derivatives Hives   Methotrexate Derivatives    Percodan [Oxycodone-Aspirin]     PAST MEDICAL HISTORY Past Medical History:  Diagnosis Date   Diabetes mellitus ([place])    Diverticula of colon    External hemorrhoid    Hypertension    Internal hemorrhoid    Kidney stones    Liver nodule    Nodule of kidney  Past Surgical History:  Procedure Laterality Date   ABDOMINAL HYSTERECTOMY  1991   BREAST REDUCTION SURGERY  1985   HAND SURGERY  2001   left, pins   LASIK  2005   bilateral   LITHOTRIPSY  1983    FAMILY HISTORY Family History  Problem Relation Age of Onset   Breast cancer Mother    Diabetes Father    Prostate cancer Maternal Uncle    Stroke Maternal Grandmother    Heart attack Maternal Grandfather    Diabetes Brother     SOCIAL HISTORY Social History   Tobacco Use   Smoking status: Never   Smokeless tobacco: Never  Substance Use Topics   Alcohol use: No   Drug use: No         OPHTHALMIC EXAM:  Base Eye Exam     Visual Acuity (ETDRS)       Right Left   Dist [place] 20/25 +1 20/25 -1         Tonometry  (Tonopen, 11:01 AM)       Right Left   Pressure 14 14         Pupils       Pupils Dark Light Shape React APD   Right PERRL 3 2 Round Brisk None   Left PERRL 3 2 Round Brisk None         Visual Fields       Left Right   Restrictions  Partial outer inferior temporal deficiency         Neuro/Psych     Oriented x3: Yes   Mood/Affect: Normal         Dilation     Both eyes: 1.0% Mydriacyl, 2.5% Phenylephrine @ 11:01 AM           Slit Lamp and Fundus Exam     External Exam       Right Left   External Normal Normal         Slit Lamp Exam       Right Left   Lids/Lashes Normal Normal   Conjunctiva/Sclera White and quiet White and quiet   Cornea Clear Clear   Anterior Chamber Deep and quiet Deep and quiet   Iris Round and reactive Round and reactive   Lens Posterior chamber intraocular lens, Open posterior capsule Posterior chamber intraocular lens, Open posterior capsule   Anterior Vitreous Normal Normal         Fundus Exam       Right Left   Posterior Vitreous Posterior vitreous detachment Normal   Disc Peripapillary atrophy Normal   C/D Ratio 0.7 0.5   Macula Microaneurysms, Macular thickening, Hard drusen, Moderate clinically significant macular edema, Exudates No Macular thickening, focal clinically significant macular edema, superior to the FAZ region, exudates, Microaneurysms, Mild clinically significant macular edema   Vessels NPDR-Severe Retinopathy, severe nonproliferative diabetic retinopathy with limited peripheral PRP room temporally and superiorly for more laser   Periphery Peripheral retinal nonperfusion, good PRP superiorly and nasally, room inferiorly for more PRP Peripheral retinal nonperfusion nasal and inferior            IMAGING AND PROCEDURES  Imaging and Procedures for 08/28/20  Color Fundus Photography Optos - OU - Both Eyes       Right Eye Progression has worsened. Disc findings include normal observations. Macula  : microaneurysms.   Left Eye Progression has worsened. Disc findings include normal observations. Macula : microaneurysms.   Notes OD with severe nonproliferative diabetic retinopathy,  status post moderate scatter panel photocoagulation and focal laser treatment.  CSME OD recurrent will need treatment  OS with similar findings with severe NPDR status post focal laser treatment inferiorly.  CSME improved OS centrally     Fluorescein Angiography Optos (Transit OS)       Injection: 500 mg Fluorescein Sodium 10 %   Route: Intravenous, Site: Left Arm   NDC: [phone]   Right Eye   Progression has improved. Mid/Late phase findings include leakage, vascular perfusion defect. Choroidal neovascularization is not present.   Left Eye   Progression has improved. Early phase findings include leakage, microaneurysm. Mid/Late phase findings include vascular perfusion defect. Choroidal neovascularization is not present.   Notes Severe capillary dropout OU OD worse than OS.  CSME as noted OS, yet overall improved as compared to photos some 1 year previous  Will complete superior PRP  OS i diet to decrease vegF load  OD will need peripheral PRP with attention nasal to the nerve as well as temporal anterior periphery     OCT, Retina - OU - Both Eyes       Right Eye Quality was good. Scan locations included subfoveal. Central Foveal Thickness: 242. Progression has improved. Findings include abnormal foveal contour, cystoid macular edema.   Left Eye Quality was good. Scan locations included subfoveal. Central Foveal Thickness: 214. Progression has improved. Findings include abnormal foveal contour, cystoid macular edema.   Notes Vastly improved center involved CSME OD now 5 weeks post intravitreal Eylea, examination OD next as scheduled  OS with improved CSME at 6 weeks postinjection antivegF     Panretinal Photocoagulation - OS - Left Eye       Time Out Confirmed correct  patient, procedure, site, and patient consented.   Anesthesia Topical anesthesia was used. Anesthetic medications included Proparacaine 0.5%.   Laser Information The type of laser was diode. Color was yellow. The duration in seconds was 0.04. The spot size was 390 microns. Laser power was 280. Total spots was 801.   Post-op The patient tolerated the procedure well. There were no complications. The patient received written and verbal post procedure care education.   Notes Superotemporal and superior retinal             ASSESSMENT/PLAN:  No problem-specific Assessment & Plan notes found for this encounter.      ICD-10-CM   1. Severe nonproliferative diabetic retinopathy of right eye, with macular edema, associated with type 2 diabetes mellitus (HCC)  I29.7989 Color Fundus Photography Optos - OU - Both Eyes    Fluorescein Angiography Optos (Transit OS)    OCT, Retina - OU - Both Eyes    Fluorescein Sodium 10 % injection 500 mg    2. Severe nonproliferative diabetic retinopathy of left eye, with macular edema, associated with type 2 diabetes mellitus (HCC)  Q11.9417 Color Fundus Photography Optos - OU - Both Eyes    Fluorescein Angiography Optos (Transit OS)    OCT, Retina - OU - Both Eyes    Panretinal Photocoagulation - OS - Left Eye    Fluorescein Sodium 10 % injection 500 mg      1.  Peripheral retinal nonperfusion OU reconfirmed today.  We will deliver PRP OS superiorly and superotemporally to decrease vegF burden  2.  OD subsequently will need peripheral PRP will schedule in 2 weeks with FFA guidance from today's examination  3.  Ophthalmic Meds Ordered this visit:  Meds ordered this encounter  Medications   Fluorescein Sodium  10 % injection 500 mg       Return in about 2 weeks (around 09/11/2020) for dilate, OD, PRP.  There are no Patient Instructions on file for this visit.   Explained the diagnoses, plan, and follow up with the patient and they expressed  understanding.  Patient expressed understanding of the importance of proper follow up care.   Clent Demark Layla Kesling M.D. Diseases & Surgery of the Retina and Vitreous Retina & Diabetic [facility] 08/28/20     Abbreviations: M myopia (nearsighted); A astigmatism; H hyperopia (farsighted); P presbyopia; Mrx spectacle prescription;  CTL contact lenses; OD right eye; OS left eye; OU both eyes  XT exotropia; ET esotropia; PEK punctate epithelial keratitis; PEE punctate epithelial erosions; DES dry eye syndrome; MGD meibomian gland dysfunction; ATs artificial tears; PFAT's preservative free artificial tears; [place] nuclear sclerotic cataract; PSC posterior subcapsular cataract; ERM epi-retinal membrane; PVD posterior vitreous detachment; RD retinal detachment; DM diabetes mellitus; DR diabetic retinopathy; NPDR non-proliferative diabetic retinopathy; PDR proliferative diabetic retinopathy; CSME clinically significant macular edema; DME diabetic macular edema; dbh dot blot hemorrhages; CWS cotton wool spot; POAG primary open angle glaucoma; C/D cup-to-disc ratio; HVF humphrey visual field; GVF goldmann visual field; OCT optical coherence tomography; IOP intraocular pressure; BRVO Branch retinal vein occlusion; CRVO central retinal vein occlusion; CRAO central retinal artery occlusion; BRAO branch retinal artery occlusion; RT retinal tear; SB scleral buckle; PPV pars plana vitrectomy; VH Vitreous hemorrhage; PRP panretinal laser photocoagulation; IVK intravitreal kenalog; VMT vitreomacular traction; MH Macular hole;  NVD neovascularization of the disc; NVE neovascularization elsewhere; AREDS age related eye disease study; ARMD age related macular degeneration; POAG primary open angle glaucoma; EBMD epithelial/anterior basement membrane dystrophy; ACIOL anterior chamber intraocular lens; IOL intraocular lens; PCIOL posterior chamber intraocular lens; Phaco/IOL phacoemulsification with intraocular lens placement; [place]  photorefractive keratectomy; LASIK laser assisted in situ keratomileusis; HTN hypertension; DM diabetes mellitus; COPD chronic obstructive pulmonary disease

## 2020-08-30 DIAGNOSIS — L4050 Arthropathic psoriasis, unspecified: Secondary | ICD-10-CM | POA: Diagnosis not present

## 2020-09-04 DIAGNOSIS — Z9641 Presence of insulin pump (external) (internal): Secondary | ICD-10-CM | POA: Diagnosis not present

## 2020-09-04 DIAGNOSIS — E1169 Type 2 diabetes mellitus with other specified complication: Secondary | ICD-10-CM | POA: Diagnosis not present

## 2020-09-04 DIAGNOSIS — I10 Essential (primary) hypertension: Secondary | ICD-10-CM | POA: Diagnosis not present

## 2020-09-04 DIAGNOSIS — E7800 Pure hypercholesterolemia, unspecified: Secondary | ICD-10-CM | POA: Diagnosis not present

## 2020-09-11 ENCOUNTER — Other Ambulatory Visit: Payer: Self-pay

## 2020-09-11 ENCOUNTER — Ambulatory Visit (INDEPENDENT_AMBULATORY_CARE_PROVIDER_SITE_OTHER): Payer: Medicare Other | Admitting: Ophthalmology

## 2020-09-11 ENCOUNTER — Encounter (INDEPENDENT_AMBULATORY_CARE_PROVIDER_SITE_OTHER): Payer: Self-pay | Admitting: Ophthalmology

## 2020-09-11 DIAGNOSIS — E113411 Type 2 diabetes mellitus with severe nonproliferative diabetic retinopathy with macular edema, right eye: Secondary | ICD-10-CM | POA: Diagnosis not present

## 2020-09-11 NOTE — Assessment & Plan Note (Signed)
CSME OD, under therapy with antivegF.  Nonetheless extreme vegF load with regions of retinal nonperfusion temporally.  PRP #3 delivered OD today to the temporal peripheral retina

## 2020-09-11 NOTE — Progress Notes (Signed)
09/11/2020     CHIEF COMPLAINT Patient presents for Eye Problem (For planned PRP OD to regions of retinal nonperfusion//OS now 2 weeks post recent peripheral PRP)   HISTORY OF PRESENT ILLNESS: Shannon Craig is a 70 y.o. female who presents to the clinic today for:   HPI     Eye Problem           Comments: For planned PRP OD to regions of retinal nonperfusion  OS now 2 weeks post recent peripheral PRP       Last edited by Hurman Horn, MD on 09/11/2020 10:59 AM.      Referring physician: Merrilee Seashore, MD [address] [place],  [place] [address]  HISTORICAL INFORMATION:   Selected notes from the [facility]: No current outpatient medications on file. (Ophthalmic Drugs)   No current facility-administered medications for this visit. (Ophthalmic Drugs)   Current Outpatient Medications (Other)  Medication Sig   Albiglutide (TANZEUM) 50 MG PEN Inject into the skin.   Alogliptin Benzoate (NESINA) 25 MG TABS Take by mouth.   atorvastatin (LIPITOR) 40 MG tablet Take 40 mg by mouth daily.   Calcium-Vitamin D-Vitamin K (VIACTIV PO) Take 1 tablet by mouth daily.   Canagliflozin (INVOKANA) 300 MG TABS Take 300 mg by mouth daily.   Certolizumab Pegol (CIMZIA) 2 X 200 MG KIT Inject 1 mL into the skin.   diltiazem (CARDIZEM) 120 MG tablet Take by mouth.   empagliflozin (JARDIANCE) 25 MG TABS tablet Take 25 mg by mouth daily.   HYDROcodone-acetaminophen (NORCO/VICODIN) 5-325 MG tablet Take 1 tablet by mouth every 6 (six) hours as needed.   InFLIXimab (REMICADE IV) Inject into the vein.   insulin aspart protamine - aspart (NOVOLOG MIX 70/30 FLEXPEN) (70-30) 100 UNIT/ML FlexPen inject 25 units before breakfast and 10 units before dinner subcutaneously   meloxicam (MOBIC) 15 MG tablet    Molnupiravir 200 MG CAPS Take 4 capules by mouth every 12 hours   Multiple Vitamin (MULTIVITAMIN) tablet Take 1 tablet by mouth daily.    pioglitazone (ACTOS) 15 MG tablet Take 15 mg by mouth daily.   ramipril (ALTACE) 5 MG capsule Take 5 mg by mouth daily.   traMADol (ULTRAM) 50 MG tablet Take 1 tablet (50 mg total) by mouth every 6 (six) hours as needed. (Patient not taking: Reported on 06/02/2019)   No current facility-administered medications for this visit. (Other)      REVIEW OF SYSTEMS:    ALLERGIES Allergies  Allergen Reactions   Darvocet [Propoxyphene N-Acetaminophen]    Gadolinium Derivatives Hives   Methotrexate Derivatives    Percodan [Oxycodone-Aspirin]     PAST MEDICAL HISTORY Past Medical History:  Diagnosis Date   Diabetes mellitus ([place])    Diverticula of colon    External hemorrhoid    Hypertension    Internal hemorrhoid    Kidney stones    Liver nodule    Nodule of kidney    Past Surgical History:  Procedure Laterality Date   [place]   HAND SURGERY  2001   left, pins   LASIK  2005   bilateral   LITHOTRIPSY  1983    FAMILY HISTORY Family History  Problem Relation Age of Onset   Breast cancer Mother    Diabetes Father    Prostate cancer Maternal Uncle    Stroke Maternal Grandmother  Heart attack Maternal Grandfather    Diabetes Brother     SOCIAL HISTORY Social History   Tobacco Use   Smoking status: Never   Smokeless tobacco: Never  Substance Use Topics   Alcohol use: No   Drug use: No         OPHTHALMIC EXAM:  Base Eye Exam     Visual Acuity (ETDRS)       Right Left   Dist [place] 20/20 20/25         Tonometry ([place], 10:58 AM)       Right Left   Pressure 15 15         Dilation     Right eye: 1.0% Mydriacyl, 2.5% Phenylephrine @ 10:58 AM            IMAGING AND PROCEDURES  Imaging and Procedures for 09/11/20  Panretinal Photocoagulation - OD - Right Eye       Time Out Confirmed correct patient, procedure, site, and patient consented.   Anesthesia Topical anesthesia was used.  Anesthetic medications included Proparacaine 0.5%.   Laser Information The type of laser was diode. Color was yellow. The duration in seconds was 0.03. The spot size was 390 microns. Laser power was 280. Total spots was 556.   Post-op The patient tolerated the procedure well. There were no complications. The patient received written and verbal post procedure care education.   Notes Temporal anterior peripherally delivered today             ASSESSMENT/PLAN:  No problem-specific Assessment & Plan notes found for this encounter.      ICD-10-CM   1. Severe nonproliferative diabetic retinopathy of right eye, with macular edema, associated with type 2 diabetes mellitus (HCC)  H47.6546 Panretinal Photocoagulation - OD - Right Eye      1.  Peripheral anterior PRP delivered temporal retina OD today to decrease vegF burden and severe in PDR and recurrent CSME.  2.  OD next as scheduled likely injection Eylea for chronic recurrent CSME OD  3.  Ophthalmic Meds Ordered this visit:  No orders of the defined types were placed in this encounter.      Return for dilate, OD, As scheduled, EYLEA OCT.  There are no Patient Instructions on file for this visit.   Explained the diagnoses, plan, and follow up with the patient and they expressed understanding.  Patient expressed understanding of the importance of proper follow up care.   Clent Demark Sherrod Toothman M.D. Diseases & Surgery of the Retina and Vitreous Retina & Diabetic [facility] 09/11/20     Abbreviations: M myopia (nearsighted); A astigmatism; H hyperopia (farsighted); P presbyopia; Mrx spectacle prescription;  CTL contact lenses; OD right eye; OS left eye; OU both eyes  XT exotropia; ET esotropia; PEK punctate epithelial keratitis; PEE punctate epithelial erosions; DES dry eye syndrome; MGD meibomian gland dysfunction; ATs artificial tears; PFAT's preservative free artificial tears; [place] nuclear sclerotic cataract; PSC posterior  subcapsular cataract; ERM epi-retinal membrane; PVD posterior vitreous detachment; RD retinal detachment; DM diabetes mellitus; DR diabetic retinopathy; NPDR non-proliferative diabetic retinopathy; PDR proliferative diabetic retinopathy; CSME clinically significant macular edema; DME diabetic macular edema; dbh dot blot hemorrhages; CWS cotton wool spot; POAG primary open angle glaucoma; C/D cup-to-disc ratio; HVF humphrey visual field; GVF goldmann visual field; OCT optical coherence tomography; IOP intraocular pressure; BRVO Branch retinal vein occlusion; CRVO central retinal vein occlusion; CRAO central retinal artery occlusion; BRAO branch retinal artery occlusion; RT retinal tear; SB scleral buckle; PPV  pars plana vitrectomy; VH Vitreous hemorrhage; PRP panretinal laser photocoagulation; IVK intravitreal kenalog; VMT vitreomacular traction; MH Macular hole;  NVD neovascularization of the disc; NVE neovascularization elsewhere; AREDS age related eye disease study; ARMD age related macular degeneration; POAG primary open angle glaucoma; EBMD epithelial/anterior basement membrane dystrophy; ACIOL anterior chamber intraocular lens; IOL intraocular lens; PCIOL posterior chamber intraocular lens; Phaco/IOL phacoemulsification with intraocular lens placement; [place] photorefractive keratectomy; LASIK laser assisted in situ keratomileusis; HTN hypertension; DM diabetes mellitus; COPD chronic obstructive pulmonary disease

## 2020-09-13 DIAGNOSIS — M810 Age-related osteoporosis without current pathological fracture: Secondary | ICD-10-CM | POA: Diagnosis not present

## 2020-09-18 ENCOUNTER — Encounter (INDEPENDENT_AMBULATORY_CARE_PROVIDER_SITE_OTHER): Payer: Self-pay | Admitting: Ophthalmology

## 2020-09-18 ENCOUNTER — Other Ambulatory Visit: Payer: Self-pay

## 2020-09-18 ENCOUNTER — Encounter (INDEPENDENT_AMBULATORY_CARE_PROVIDER_SITE_OTHER): Payer: Medicare Other | Admitting: Ophthalmology

## 2020-09-18 ENCOUNTER — Ambulatory Visit (INDEPENDENT_AMBULATORY_CARE_PROVIDER_SITE_OTHER): Payer: Medicare Other | Admitting: Ophthalmology

## 2020-09-18 DIAGNOSIS — E113411 Type 2 diabetes mellitus with severe nonproliferative diabetic retinopathy with macular edema, right eye: Secondary | ICD-10-CM

## 2020-09-18 DIAGNOSIS — E113412 Type 2 diabetes mellitus with severe nonproliferative diabetic retinopathy with macular edema, left eye: Secondary | ICD-10-CM

## 2020-09-18 MED ORDER — AFLIBERCEPT 2MG/0.05ML IZ SOLN FOR KALEIDOSCOPE
2.0000 mg | INTRAVITREAL | Status: AC | PRN
  Administered 2020-09-18: 2 mg via INTRAVITREAL

## 2020-09-18 NOTE — Assessment & Plan Note (Signed)
OD, at 8 weeks, slight recurrence of CSME not center involvement threatening, superior to FAZ.  Repeat Eylea OD today and examination again in 8 to 9 weeks yet focal laser treatment to the right eye superior to this FAZ and 2 to 3 weeks

## 2020-09-18 NOTE — Assessment & Plan Note (Signed)
The nature of severe nonproliferative diabetic retinopathy discussed with the patient as well as the need for more frequent follow up and likely progression to proliferative disease in the near future. The options of continued observation versus panretinal photocoagulation at this time were reviewed as well as the risks, benefits, and alternatives. More recent option includes the use of ocular injectable medications to slow progression of retinal disease. Tight control of glucose, blood pressure, and serum lipid levels were recommended under the direction of general physician or endocrinologist, as well as avoidance of smoking and maintenance of normal body weight. The 2-year risk of progression to proliferative diabetic retinopathy is 60%.  OS with recurrent CSME, will need repeat injection Eylea soon

## 2020-09-18 NOTE — Progress Notes (Signed)
09/18/2020     CHIEF COMPLAINT Patient presents for Retina Follow Up (8 week fu OD and Eylea OD/Pt states [facility] OU stable since last visit. Pt denies FOL, floaters, or ocular pain OU. Karie [place]: 8.6/LBS: 119/)   HISTORY OF PRESENT ILLNESS: Shannon Craig is a 70 y.o. female who presents to the clinic today for:   HPI     Retina Follow Up           Diagnosis: Diabetic Retinopathy   Laterality: right eye   Onset: 8 weeks ago   Severity: mild   Duration: 8 weeks   Course: stable   Comments: 8 week fu OD and Eylea OD Pt states VA OU stable since last visit. Pt denies FOL, floaters, or ocular pain OU.  A1C: 8.6 LBS: 119        Last edited by Kendra Opitz, COA on 09/18/2020  8:46 AM.      Referring physician: Merrilee Seashore, MD [id number] [place],  [place] [address]  HISTORICAL INFORMATION:   Selected notes from the MEDICAL RECORD NUMBER       CURRENT MEDICATIONS: No current outpatient medications on file. (Ophthalmic Drugs)   No current facility-administered medications for this visit. (Ophthalmic Drugs)   Current Outpatient Medications (Other)  Medication Sig   Albiglutide (TANZEUM) 50 MG PEN Inject into the skin.   Alogliptin Benzoate (NESINA) 25 MG TABS Take by mouth.   atorvastatin (LIPITOR) 40 MG tablet Take 40 mg by mouth daily.   Calcium-Vitamin D-Vitamin K (VIACTIV PO) Take 1 tablet by mouth daily.   Canagliflozin (INVOKANA) 300 MG TABS Take 300 mg by mouth daily.   Certolizumab Pegol (CIMZIA) 2 X 200 MG KIT Inject 1 mL into the skin.   diltiazem (CARDIZEM) 120 MG tablet Take by mouth.   empagliflozin (JARDIANCE) 25 MG TABS tablet Take 25 mg by mouth daily.   HYDROcodone-acetaminophen (NORCO/VICODIN) 5-325 MG tablet Take 1 tablet by mouth every 6 (six) hours as needed.   InFLIXimab (REMICADE IV) Inject into the vein.   insulin aspart protamine - aspart (NOVOLOG MIX 70/30 FLEXPEN) (70-30) 100 UNIT/ML FlexPen inject 25 units before breakfast  and 10 units before dinner subcutaneously   meloxicam (MOBIC) 15 MG tablet    Molnupiravir 200 MG CAPS Take 4 capules by mouth every 12 hours   Multiple Vitamin (MULTIVITAMIN) tablet Take 1 tablet by mouth daily.   pioglitazone (ACTOS) 15 MG tablet Take 15 mg by mouth daily.   ramipril (ALTACE) 5 MG capsule Take 5 mg by mouth daily.   traMADol (ULTRAM) 50 MG tablet Take 1 tablet (50 mg total) by mouth every 6 (six) hours as needed. (Patient not taking: Reported on 06/02/2019)   No current facility-administered medications for this visit. (Other)      REVIEW OF SYSTEMS:    ALLERGIES Allergies  Allergen Reactions   Darvocet [Propoxyphene N-Acetaminophen]    Gadolinium Derivatives Hives   Methotrexate Derivatives    Percodan [Oxycodone-Aspirin]     PAST MEDICAL HISTORY Past Medical History:  Diagnosis Date   Diabetes mellitus ([place])    Diverticula of colon    External hemorrhoid    Hypertension    Internal hemorrhoid    Kidney stones    Liver nodule    Nodule of kidney    Past Surgical History:  Procedure Laterality Date   [place]   HAND SURGERY  2001   left, pins  LASIK  2005   bilateral   LITHOTRIPSY  1983    FAMILY HISTORY Family History  Problem Relation Age of Onset   Breast cancer Mother    Diabetes Father    Prostate cancer Maternal Uncle    Stroke Maternal Grandmother    Heart attack Maternal Grandfather    Diabetes Brother     SOCIAL HISTORY Social History   Tobacco Use   Smoking status: Never   Smokeless tobacco: Never  Substance Use Topics   Alcohol use: No   Drug use: No         OPHTHALMIC EXAM:  Base Eye Exam     Visual Acuity (ETDRS)       Right Left   Dist [place] 20/20 20/25         Tonometry (Tonopen, 8:51 AM)       Right Left   Pressure 17 14         Pupils       Pupils Dark Light Shape React APD   Right PERRL 3 2 Round Brisk None   Left PERRL 3 2 Round Brisk  None         Visual Fields       Left Right   Restrictions  Partial outer inferior temporal, inferior nasal deficiencies         Extraocular Movement       Right Left    Full Full         Neuro/Psych     Oriented x3: Yes   Mood/Affect: Normal         Dilation     Right eye: 1.0% Mydriacyl, 2.5% Phenylephrine @ 8:51 AM           Slit Lamp and Fundus Exam     External Exam       Right Left   External Normal Normal         Slit Lamp Exam       Right Left   Lids/Lashes Normal Normal   Conjunctiva/Sclera White and quiet White and quiet   Cornea Clear Clear   Anterior Chamber Deep and quiet Deep and quiet   Iris Round and reactive Round and reactive   Lens Posterior chamber intraocular lens, Open posterior capsule Posterior chamber intraocular lens, Open posterior capsule   Anterior Vitreous Normal Normal         Fundus Exam       Right Left   Posterior Vitreous Posterior vitreous detachment    Disc Peripapillary atrophy    C/D Ratio 0.7 0.5   Macula Microaneurysms, Macular thickening superior to the fovea, Hard drusen, Moderate clinically significant macular edema.  Early, Exudates    Vessels NPDR-Severe    Periphery Peripheral retinal nonperfusion, good PRP superiorly and nasally, room inferiorly for more PRP             IMAGING AND PROCEDURES  Imaging and Procedures for 09/18/20  OCT, Retina - OU - Both Eyes       Right Eye Quality was good. Scan locations included subfoveal. Central Foveal Thickness: 275. Progression has worsened. Findings include abnormal foveal contour, cystoid macular edema.   Left Eye Quality was good. Scan locations included subfoveal. Central Foveal Thickness: 214. Progression has improved. Findings include abnormal foveal contour, cystoid macular edema.   Notes Vastly improved center involved CSME OD now 8 weeks post intravitreal Eylea, yet with residual CSME superior to the fovea active OD.  Will need  intravitreal Eylea today  Schedule focal to this region superiorly in 2 weeks for long-term control.  OS with improved CSME at 9 weeks post injection intravitreal antivegF OS Also with recurrence of CSME superonasal to FAZ.  Will need repeat Eylea OS soon and likely focal laser to this region superonasal to FAZ     Intravitreal Injection, Pharmacologic Agent - OD - Right Eye       Time Out 09/18/2020. 9:21 AM. Confirmed correct patient, procedure, site, and patient consented.   Anesthesia Topical anesthesia was used. Anesthetic medications included Akten 3.5%.   Procedure Preparation included Ofloxacin , 10% betadine to eyelids, 5% betadine to ocular surface. A 30 gauge needle was used.   Injection: 2 mg aflibercept 2 MG/0.05ML   Route: Intravitreal, Site: Right Eye   NDC: [id number], Lot: [id number], Waste: 0 mL   Post-op Post injection exam found visual acuity of at least counting fingers. The patient tolerated the procedure well. There were no complications. The patient received written and verbal post procedure care education. Post injection medications were not given.              ASSESSMENT/PLAN:  Severe nonproliferative diabetic retinopathy of left eye, with macular edema, associated with type 2 diabetes mellitus ([place]) The nature of severe nonproliferative diabetic retinopathy discussed with the patient as well as the need for more frequent follow up and likely progression to proliferative disease in the near future. The options of continued observation versus panretinal photocoagulation at this time were reviewed as well as the risks, benefits, and alternatives. More recent option includes the use of ocular injectable medications to slow progression of retinal disease. Tight control of glucose, blood pressure, and serum lipid levels were recommended under the direction of general physician or endocrinologist, as well as avoidance of smoking and maintenance of normal body  weight. The 2-year risk of progression to proliferative diabetic retinopathy is 60%.  OS with recurrent CSME, will need repeat injection Eylea soon  Severe nonproliferative diabetic retinopathy of right eye, with macular edema, associated with type 2 diabetes mellitus (HCC) OD, at 8 weeks, slight recurrence of CSME not center involvement threatening, superior to [place].  Repeat Eylea OD today and examination again in 8 to 9 weeks yet focal laser treatment to the right eye superior to this FAZ and 2 to 3 weeks      ICD-10-CM   1. Severe nonproliferative diabetic retinopathy of right eye, with macular edema, associated with type 2 diabetes mellitus (HCC)  E11.3411 OCT, Retina - OU - Both Eyes    Intravitreal Injection, Pharmacologic Agent - OD - Right Eye    aflibercept (EYLEA) SOLN 2 mg    2. Severe nonproliferative diabetic retinopathy of left eye, with macular edema, associated with type 2 diabetes mellitus ([place])  X73.5329       1.  Bilateral severe NPDR each with contributory retinal nonperfusion to the posterior pole CSME.  More recent PRP to decrease vegF load.  OU completed  2.  At 8-week interval CSME OD has recurred, Eylea injection today to diminish and deliver focal laser treatment soon to prevent recurrent superior the fovea  3.  At 9-week interval CSME OS is also recurred superonasal to the FAZ, will need repeat injection Eylea soon  Ophthalmic Meds Ordered this visit:  Meds ordered this encounter  Medications   aflibercept (EYLEA) SOLN 2 mg       Return in about 3 weeks (around 10/09/2020) for dilate, OD, OCT, FOCAL superior to FAZ, and OS dilate  Eylea OCT, 1-2 weeks.  There are no Patient Instructions on file for this visit.   Explained the diagnoses, plan, and follow up with the patient and they expressed understanding.  Patient expressed understanding of the importance of proper follow up care.   Clent Demark Loden Laurent M.D. Diseases & Surgery of the Retina and  Vitreous Retina & Diabetic [place] 09/18/20     Abbreviations: M myopia (nearsighted); A astigmatism; H hyperopia (farsighted); P presbyopia; Mrx spectacle prescription;  CTL contact lenses; OD right eye; OS left eye; OU both eyes  XT exotropia; ET esotropia; PEK punctate epithelial keratitis; PEE punctate epithelial erosions; DES dry eye syndrome; MGD meibomian gland dysfunction; ATs artificial tears; PFAT's preservative free artificial tears; [place] nuclear sclerotic cataract; PSC posterior subcapsular cataract; ERM epi-retinal membrane; PVD posterior vitreous detachment; RD retinal detachment; DM diabetes mellitus; DR diabetic retinopathy; NPDR non-proliferative diabetic retinopathy; PDR proliferative diabetic retinopathy; CSME clinically significant macular edema; DME diabetic macular edema; dbh dot blot hemorrhages; CWS cotton wool spot; POAG primary open angle glaucoma; C/D cup-to-disc ratio; HVF humphrey visual field; GVF goldmann visual field; OCT optical coherence tomography; IOP intraocular pressure; BRVO Branch retinal vein occlusion; CRVO central retinal vein occlusion; CRAO central retinal artery occlusion; BRAO branch retinal artery occlusion; RT retinal tear; SB scleral buckle; PPV pars plana vitrectomy; VH Vitreous hemorrhage; PRP panretinal laser photocoagulation; IVK intravitreal kenalog; VMT vitreomacular traction; MH Macular hole;  NVD neovascularization of the disc; NVE neovascularization elsewhere; AREDS age related eye disease study; ARMD age related macular degeneration; POAG primary open angle glaucoma; EBMD epithelial/anterior basement membrane dystrophy; ACIOL anterior chamber intraocular lens; IOL intraocular lens; PCIOL posterior chamber intraocular lens; Phaco/IOL phacoemulsification with intraocular lens placement; [place] photorefractive keratectomy; LASIK laser assisted in situ keratomileusis; HTN hypertension; DM diabetes mellitus; COPD chronic obstructive pulmonary disease

## 2020-09-25 DIAGNOSIS — R5383 Other fatigue: Secondary | ICD-10-CM | POA: Diagnosis not present

## 2020-09-25 DIAGNOSIS — D696 Thrombocytopenia, unspecified: Secondary | ICD-10-CM | POA: Diagnosis not present

## 2020-09-25 DIAGNOSIS — E118 Type 2 diabetes mellitus with unspecified complications: Secondary | ICD-10-CM | POA: Diagnosis not present

## 2020-09-25 DIAGNOSIS — I10 Essential (primary) hypertension: Secondary | ICD-10-CM | POA: Diagnosis not present

## 2020-09-25 DIAGNOSIS — E782 Mixed hyperlipidemia: Secondary | ICD-10-CM | POA: Diagnosis not present

## 2020-09-27 DIAGNOSIS — L4050 Arthropathic psoriasis, unspecified: Secondary | ICD-10-CM | POA: Diagnosis not present

## 2020-10-02 ENCOUNTER — Other Ambulatory Visit: Payer: Self-pay

## 2020-10-02 ENCOUNTER — Encounter (INDEPENDENT_AMBULATORY_CARE_PROVIDER_SITE_OTHER): Payer: Self-pay | Admitting: Ophthalmology

## 2020-10-02 ENCOUNTER — Ambulatory Visit (INDEPENDENT_AMBULATORY_CARE_PROVIDER_SITE_OTHER): Payer: Medicare Other | Admitting: Ophthalmology

## 2020-10-02 DIAGNOSIS — E113411 Type 2 diabetes mellitus with severe nonproliferative diabetic retinopathy with macular edema, right eye: Secondary | ICD-10-CM

## 2020-10-02 DIAGNOSIS — R0683 Snoring: Secondary | ICD-10-CM | POA: Diagnosis not present

## 2020-10-02 DIAGNOSIS — E113412 Type 2 diabetes mellitus with severe nonproliferative diabetic retinopathy with macular edema, left eye: Secondary | ICD-10-CM | POA: Diagnosis not present

## 2020-10-02 MED ORDER — AFLIBERCEPT 2MG/0.05ML IZ SOLN FOR KALEIDOSCOPE
2.0000 mg | INTRAVITREAL | Status: AC | PRN
  Administered 2020-10-02: 2 mg via INTRAVITREAL

## 2020-10-02 NOTE — Progress Notes (Signed)
10/02/2020     CHIEF COMPLAINT Patient presents for No chief complaint on file.   HISTORY OF PRESENT ILLNESS: Shannon Craig is a 70 y.o. female who presents to the clinic today for:     Referring physician: Merrilee Seashore, [address] [place] [place],   [id number]  HISTORICAL INFORMATION:   Selected notes from the [place]: No current outpatient medications on file. (Ophthalmic Drugs)   No current facility-administered medications for this visit. (Ophthalmic Drugs)   Current Outpatient Medications (Other)  Medication Sig   Albiglutide (TANZEUM) 50 MG PEN Inject into the skin.   Alogliptin Benzoate (NESINA) 25 MG TABS Take by mouth.   atorvastatin (LIPITOR) 40 MG tablet Take 40 mg by mouth daily.   Calcium-Vitamin D-Vitamin K (VIACTIV PO) Take 1 tablet by mouth daily.   Canagliflozin (INVOKANA) 300 MG TABS Take 300 mg by mouth daily.   Certolizumab Pegol (CIMZIA) 2 X 200 MG KIT Inject 1 mL into the skin.   diltiazem (CARDIZEM) 120 MG tablet Take by mouth.   empagliflozin (JARDIANCE) 25 MG TABS tablet Take 25 mg by mouth daily.   HYDROcodone-acetaminophen (NORCO/VICODIN) 5-325 MG tablet Take 1 tablet by mouth every 6 (six) hours as needed.   InFLIXimab (REMICADE IV) Inject into the vein.   insulin aspart protamine - aspart (NOVOLOG MIX 70/30 FLEXPEN) (70-30) 100 UNIT/ML FlexPen inject 25 units before breakfast and 10 units before dinner subcutaneously   meloxicam (MOBIC) 15 MG tablet    Molnupiravir 200 MG CAPS Take 4 capules by mouth every 12 hours   Multiple Vitamin (MULTIVITAMIN) tablet Take 1 tablet by mouth daily.   pioglitazone (ACTOS) 15 MG tablet Take 15 mg by mouth daily.   ramipril (ALTACE) 5 MG capsule Take 5 mg by mouth daily.   traMADol (ULTRAM) 50 MG tablet Take 1 tablet (50 mg total) by mouth every 6 (six) hours as needed. (Patient not taking: Reported on 06/02/2019)   No current facility-administered  medications for this visit. (Other)      REVIEW OF SYSTEMS:    ALLERGIES Allergies  Allergen Reactions   Darvocet [Propoxyphene N-Acetaminophen]    Gadolinium Derivatives Hives   Methotrexate Derivatives    Percodan [Oxycodone-Aspirin]     PAST MEDICAL HISTORY Past Medical History:  Diagnosis Date   Diabetes mellitus ([place])    Diverticula of colon    External hemorrhoid    Hypertension    Internal hemorrhoid    Kidney stones    Liver nodule    Nodule of kidney    Past Surgical History:  Procedure Laterality Date   [place]   HAND SURGERY  2001   left, pins   LASIK  2005   bilateral   LITHOTRIPSY  1983    FAMILY HISTORY Family History  Problem Relation Age of Onset   Breast cancer Mother    Diabetes Father    Prostate cancer Maternal Uncle    Stroke Maternal Grandmother    Heart attack Maternal Grandfather    Diabetes Brother     SOCIAL HISTORY Social History   Tobacco Use   Smoking status: Never   Smokeless tobacco: Never  Substance Use Topics   Alcohol use: No   Drug use: No         OPHTHALMIC EXAM:  Base Eye Exam     Visual Acuity (ETDRS)  Right Left   Dist [place] 20/20 20/50 +1   Dist ph [place]  NI         Tonometry (Tonopen, 9:53 AM)       Right Left   Pressure 10 9         Pupils       Pupils React APD   Right PERRL Brisk None   Left PERRL Brisk None         Visual Fields       Left Right    Full Full         Extraocular Movement       Right Left    Full, Ortho Full, Ortho         Neuro/Psych     Oriented x3: Yes   Mood/Affect: Normal           Slit Lamp and Fundus Exam     External Exam       Right Left   External Normal Normal         Slit Lamp Exam       Right Left   Lids/Lashes Normal Normal   Conjunctiva/Sclera White and quiet White and quiet   Cornea Clear Clear   Anterior Chamber Deep and quiet Deep and quiet   Iris  Round and reactive Round and reactive   Lens Posterior chamber intraocular lens, Open posterior capsule Posterior chamber intraocular lens, Open posterior capsule   Anterior Vitreous Normal Normal         Fundus Exam       Right Left   Posterior Vitreous  Normal   Disc  Normal   C/D Ratio  0.5   Macula  No Macular thickening, focal clinically significant macular edema, superior to the FAZ region, exudates, Microaneurysms, Mild clinically significant macular edema   Vessels  Retinopathy, severe nonproliferative diabetic retinopathy with limited peripheral PRP room temporally and superiorly for more laser   Periphery  Peripheral retinal nonperfusion nasal and inferior            IMAGING AND PROCEDURES  Imaging and Procedures for 10/02/20  OCT, Retina - OU - Both Eyes       Right Eye Quality was good. Scan locations included subfoveal. Central Foveal Thickness: 243. Progression has improved. Findings include abnormal foveal contour, cystoid macular edema.   Left Eye Quality was good. Scan locations included subfoveal. Central Foveal Thickness: 433. Progression has worsened. Findings include abnormal foveal contour, cystoid macular edema.   Notes Vastly improved center involved CSME OD now 2 weeks post intravitreal Eylea, yet with residual CSME superior to the fovea active OD.  Follow-up OD as scheduled    OS with improved CSME at 11 weeks post injection intravitreal antivegF OS and some 5 weeks post peripheral PRP to decrease vegF burden.  Now with center involved CSME also with recurrence of CSME superonasal to [place].  Will need repeat Eylea OS today and I have suggested patient also be tested for sleep apnea because I suspect nighttime hypoxia from sleep apnea is complicating long-term resolution of this condition of diabetic CSME     Intravitreal Injection, Pharmacologic Agent - OS - Left Eye       Time Out 10/02/2020. 10:47 AM. Confirmed correct patient, procedure, site,  and patient consented.   Anesthesia Topical anesthesia was used. Anesthetic medications included Akten 3.5%.   Procedure Preparation included 5% betadine to ocular surface, 10% betadine to eyelids, Ofloxacin . A 30 gauge needle was used.  Injection: 2 mg aflibercept 2 MG/0.05ML   Route: Intravitreal, Site: Left Eye   NDC: [id number], Lot: [id number], Waste: 0 mL   Post-op Post injection exam found visual acuity of at least counting fingers. The patient tolerated the procedure well. There were no complications. The patient received written and verbal post procedure care education. Post injection medications were not given.              ASSESSMENT/PLAN:  Severe nonproliferative diabetic retinopathy of left eye, with macular edema, associated with type 2 diabetes mellitus (HCC) OS at 11-week follow-up today postinjection and only 5 weeks post peripheral PRP to decrease vegF burden.  Slight decline in acuity noted over the last several weeks OS.  With massive recurrence of center involved CSME at 11 weeks.  We will repeat injection Eylea OS today.  This does raise the possibility of nighttime hypoxia of the macula from undiagnosed sleep apnea as an complicating factor to resolution of diabetic CSME.  I have asked the patient to consider sleep apnea testing via night home study via her PCP    Snoring Patient was checked years ago for sleep apnea and found to be negative.  Nonetheless patient does have review of systems that are suggestive with nocturia, occasional napping.  Most nights she awakens at least once with nocturia  Will need repeat Eylea OS today and I have suggested patient also be tested for sleep apnea because I suspect nighttime hypoxia from sleep apnea is complicating long-term resolution of this condition of diabetic CSME   Severe nonproliferative diabetic retinopathy of right eye, with macular edema, associated with type 2 diabetes mellitus ([place]) OD at 2  weeks postinjection Eylea, much improved center involved CSME and regional CSME      ICD-10-CM   1. Severe nonproliferative diabetic retinopathy of left eye, with macular edema, associated with type 2 diabetes mellitus (HCC)  U98.1191 OCT, Retina - OU - Both Eyes    Intravitreal Injection, Pharmacologic Agent - OS - Left Eye    aflibercept (EYLEA) SOLN 2 mg    2. Snoring  R06.83     3. Severe nonproliferative diabetic retinopathy of right eye, with macular edema, associated with type 2 diabetes mellitus ([place])  E11.3411       1.  Repeat injection intravitreal Eylea OS today and follow-up at shorter interval of 5 weeks to maintain resolution of CSME.  2.  Prior resolution of CSME on Eylea, but recently worse, today, I do suggest evaluation for possible nighttime induced macular hypoxia from untreated or undiagnosed sleep apnea.  3.  Patient is to contact her private PCP to arrange these testings hopefully at home sleep study  4.  Follow-up OD as scheduled next  Ophthalmic Meds Ordered this visit:  Meds ordered this encounter  Medications   aflibercept (EYLEA) SOLN 2 mg       Return in about 5 weeks (around 11/06/2020) for OS, dilate, EYLEA OCT,, and follow-up OD as scheduled.  There are no Patient Instructions on file for this visit.   Explained the diagnoses, plan, and follow up with the patient and they expressed understanding.  Patient expressed understanding of the importance of proper follow up care.   Clent Demark Taran Haynesworth M.D. Diseases & Surgery of the Retina and Vitreous Retina & Diabetic [place] 10/02/20     Abbreviations: M myopia (nearsighted); A astigmatism; H hyperopia (farsighted); P presbyopia; Mrx spectacle prescription;  CTL contact lenses; OD right eye; OS left eye; OU both eyes  XT exotropia; ET esotropia; PEK punctate epithelial keratitis; PEE punctate epithelial erosions; DES dry eye syndrome; MGD meibomian gland dysfunction; ATs artificial tears; PFAT's  preservative free artificial tears; [place] nuclear sclerotic cataract; PSC posterior subcapsular cataract; ERM epi-retinal membrane; PVD posterior vitreous detachment; RD retinal detachment; DM diabetes mellitus; DR diabetic retinopathy; NPDR non-proliferative diabetic retinopathy; PDR proliferative diabetic retinopathy; CSME clinically significant macular edema; DME diabetic macular edema; dbh dot blot hemorrhages; CWS cotton wool spot; POAG primary open angle glaucoma; C/D cup-to-disc ratio; HVF humphrey visual field; GVF goldmann visual field; OCT optical coherence tomography; IOP intraocular pressure; BRVO Branch retinal vein occlusion; CRVO central retinal vein occlusion; CRAO central retinal artery occlusion; BRAO branch retinal artery occlusion; RT retinal tear; SB scleral buckle; PPV pars plana vitrectomy; VH Vitreous hemorrhage; PRP panretinal laser photocoagulation; IVK intravitreal kenalog; VMT vitreomacular traction; MH Macular hole;  NVD neovascularization of the disc; NVE neovascularization elsewhere; AREDS age related eye disease study; ARMD age related macular degeneration; POAG primary open angle glaucoma; EBMD epithelial/anterior basement membrane dystrophy; ACIOL anterior chamber intraocular lens; IOL intraocular lens; PCIOL posterior chamber intraocular lens; Phaco/IOL phacoemulsification with intraocular lens placement; [place] photorefractive keratectomy; LASIK laser assisted in situ keratomileusis; HTN hypertension; DM diabetes mellitus; COPD chronic obstructive pulmonary disease

## 2020-10-02 NOTE — Assessment & Plan Note (Addendum)
OS at 11-week follow-up today postinjection and only 5 weeks post peripheral PRP to decrease vegF burden.  Slight decline in acuity noted over the last several weeks OS.  With massive recurrence of center involved CSME at 11 weeks.  We will repeat injection Eylea OS today.  This does raise the possibility of nighttime hypoxia of the macula from undiagnosed sleep apnea as an complicating factor to resolution of diabetic CSME.  I have asked the patient to consider sleep apnea testing via night home study via her PCP

## 2020-10-02 NOTE — Assessment & Plan Note (Addendum)
Patient was checked years ago for sleep apnea and found to be negative.  Nonetheless patient does have review of systems that are suggestive with nocturia, occasional napping.  Most nights she awakens at least once with nocturia  Will need repeat Eylea OS today and I have suggested patient also be tested for sleep apnea because I suspect nighttime hypoxia from sleep apnea is complicating long-term resolution of this condition of diabetic CSME

## 2020-10-02 NOTE — Assessment & Plan Note (Signed)
OD at 2 weeks postinjection Eylea, much improved center involved CSME and regional CSME

## 2020-10-09 ENCOUNTER — Ambulatory Visit (INDEPENDENT_AMBULATORY_CARE_PROVIDER_SITE_OTHER): Payer: Medicare Other | Admitting: Ophthalmology

## 2020-10-09 ENCOUNTER — Other Ambulatory Visit: Payer: Self-pay

## 2020-10-09 ENCOUNTER — Encounter (INDEPENDENT_AMBULATORY_CARE_PROVIDER_SITE_OTHER): Payer: Self-pay | Admitting: Ophthalmology

## 2020-10-09 DIAGNOSIS — E113411 Type 2 diabetes mellitus with severe nonproliferative diabetic retinopathy with macular edema, right eye: Secondary | ICD-10-CM | POA: Diagnosis not present

## 2020-10-09 NOTE — Assessment & Plan Note (Signed)
The nature of diabetic macular edema was discussed with the patient. Treatment options were outlined including medical therapy, laser & vitrectomy. The use of injectable medications reviewed, including Avastin, Lucentis, and Eylea. Periodic injections into the eye are likely to resolve diabetic macular edema (swelling in the center of vision). Initially, injections are delivered are delivered every 4-6 weeks, and the interval extended as the condition improves. On average, 8-9 injections the first year, and 5 in year 2. Improvement in the condition most often improves on medical therapy. Occasional use of focal laser is also recommended for residual macular edema (swelling). Excellent control of blood glucose and blood pressure are encouraged under the care of a primary physician or endocrinologist. Similarly, attempts to maintain serum cholesterol, low density lipoproteins, and high-density lipoproteins in a favorable range were recommended.   Focal applied superior to the fovea and a region treatment nave for CSME

## 2020-10-09 NOTE — Progress Notes (Signed)
10/09/2020     CHIEF COMPLAINT Patient presents for Retina Follow Up (8 week fu OD and Eylea OD/Pt states VA OU stable since last visit. Pt denies FOL, floaters, or ocular pain OU. Karie [place]: 8.6/LBS: 119/)   HISTORY OF PRESENT ILLNESS: Shannon Craig is a 70 y.o. female who presents to the clinic today for:   HPI     Retina Follow Up           Diagnosis: Diabetic Retinopathy   Laterality: right eye   Onset: 3 weeks ago   Severity: mild   Duration: 3 weeks   Course: stable   Comments: 8 week fu OD and Eylea OD Pt states VA OU stable since last visit. Pt denies FOL, floaters, or ocular pain OU.  A1C: 8.6 LBS: 119          Comments   3 wk focal OD, oct, sup to faz Patient states vision is stable and unchanged since last visit. Denies any new floaters or FOL. BS: 123 this morning A1C: 8.6      Last edited by Laurin Coder, COA on 10/09/2020 10:14 AM.      Referring physician: Merrilee Seashore, MD [address],  [place] [address]  HISTORICAL INFORMATION:   Selected notes from the MEDICAL RECORD NUMBER       CURRENT MEDICATIONS: No current outpatient medications on file. (Ophthalmic Drugs)   No current facility-administered medications for this visit. (Ophthalmic Drugs)   Current Outpatient Medications (Other)  Medication Sig   Albiglutide (TANZEUM) 50 MG PEN Inject into the skin.   Alogliptin Benzoate (NESINA) 25 MG TABS Take by mouth.   atorvastatin (LIPITOR) 40 MG tablet Take 40 mg by mouth daily.   Calcium-Vitamin D-Vitamin K (VIACTIV PO) Take 1 tablet by mouth daily.   Canagliflozin (INVOKANA) 300 MG TABS Take 300 mg by mouth daily.   Certolizumab Pegol (CIMZIA) 2 X 200 MG KIT Inject 1 mL into the skin.   diltiazem (CARDIZEM) 120 MG tablet Take by mouth.   empagliflozin (JARDIANCE) 25 MG TABS tablet Take 25 mg by mouth daily.   HYDROcodone-acetaminophen (NORCO/VICODIN) 5-325 MG tablet Take 1 tablet by mouth every 6 (six) hours  as needed.   InFLIXimab (REMICADE IV) Inject into the vein.   insulin aspart protamine - aspart (NOVOLOG MIX 70/30 FLEXPEN) (70-30) 100 UNIT/ML FlexPen inject 25 units before breakfast and 10 units before dinner subcutaneously   meloxicam (MOBIC) 15 MG tablet    Molnupiravir 200 MG CAPS Take 4 capules by mouth every 12 hours   Multiple Vitamin (MULTIVITAMIN) tablet Take 1 tablet by mouth daily.   pioglitazone (ACTOS) 15 MG tablet Take 15 mg by mouth daily.   ramipril (ALTACE) 5 MG capsule Take 5 mg by mouth daily.   traMADol (ULTRAM) 50 MG tablet Take 1 tablet (50 mg total) by mouth every 6 (six) hours as needed. (Patient not taking: Reported on 06/02/2019)   No current facility-administered medications for this visit. (Other)      REVIEW OF SYSTEMS:    ALLERGIES Allergies  Allergen Reactions   Darvocet [Propoxyphene N-Acetaminophen]    Gadolinium Derivatives Hives   Methotrexate Derivatives    Percodan [Oxycodone-Aspirin]     PAST MEDICAL HISTORY Past Medical History:  Diagnosis Date   Diabetes mellitus ([place])    Diverticula of colon    External hemorrhoid    Hypertension    Internal hemorrhoid    Kidney stones    Liver nodule  Nodule of kidney    Past Surgical History:  Procedure Laterality Date   ABDOMINAL HYSTERECTOMY  1991   BREAST REDUCTION SURGERY  1985   HAND SURGERY  2001   left, pins   LASIK  2005   bilateral   LITHOTRIPSY  1983    FAMILY HISTORY Family History  Problem Relation Age of Onset   Breast cancer Mother    Diabetes Father    Prostate cancer Maternal Uncle    Stroke Maternal Grandmother    Heart attack Maternal Grandfather    Diabetes Brother     SOCIAL HISTORY Social History   Tobacco Use   Smoking status: Never   Smokeless tobacco: Never  Substance Use Topics   Alcohol use: No   Drug use: No         OPHTHALMIC EXAM:  Base Eye Exam     Visual Acuity (ETDRS)       Right Left   Dist [place] 20/25 +2 20/25 -2   Dist ph [place]   NI         Tonometry (Tonopen, 10:13 AM)       Right Left   Pressure 18 14         Pupils       Pupils Dark Light Shape React APD   Right PERRL 3 2 Round Brisk None   Left PERRL 3 2 Round Brisk None         Visual Fields (Counting fingers)       Left Right    Full    Restrictions  Partial outer inferior temporal deficiency         Extraocular Movement       Right Left    Full Full         Neuro/Psych     Oriented x3: Yes   Mood/Affect: Normal         Dilation     Right eye: 1.0% Mydriacyl, 2.5% Phenylephrine @ 10:13 AM           Slit Lamp and Fundus Exam     External Exam       Right Left   External Normal Normal         Slit Lamp Exam       Right Left   Lids/Lashes Normal Normal   Conjunctiva/Sclera White and quiet White and quiet   Cornea Clear Clear   Anterior Chamber Deep and quiet Deep and quiet   Iris Round and reactive Round and reactive   Lens Posterior chamber intraocular lens, Open posterior capsule Posterior chamber intraocular lens, Open posterior capsule   Anterior Vitreous Normal Normal         Fundus Exam       Right Left   Posterior Vitreous Posterior vitreous detachment    Disc Peripapillary atrophy    C/D Ratio 0.7    Macula Microaneurysms, Macular thickening superior to the fovea, Hard drusen, Moderate clinically significant macular edema.  Early, Exudates    Vessels NPDR-Severe    Periphery Peripheral retinal nonperfusion, good PRP superiorly and nasally,and inferiorly             IMAGING AND PROCEDURES  Imaging and Procedures for 10/09/20  OCT, Retina - OU - Both Eyes       Right Eye Quality was good. Scan locations included subfoveal. Central Foveal Thickness: 224. Progression has improved. Findings include abnormal foveal contour, cystoid macular edema.   Left Eye Quality was good. Scan locations included  subfoveal. Central Foveal Thickness: 277. Progression has been stable. Findings  include abnormal foveal contour, cystoid macular edema.   Notes Vastly improved center involved CSME OD now  3 weeks post intravitreal Eylea, yet with residual CSME superior to the fovea active OD.  Focal laser OD superior today    OS with improved CSME at 1 weeks post injection intravitreal antivegF OS and some 6 weeks post peripheral PRP to decrease vegF burden.  Now with center involved CSME also with recurrence of CSME superonasal to [place].     Focal Laser - OD - Right Eye       Time Out Confirmed correct patient, procedure, site, and patient consented.   Anesthesia Topical anesthesia was used. Anesthetic medications included Tetracaine 0.5%.   Laser Information The type of laser was diode. Color was yellow. The duration in seconds was 0.1. The spot size was 100 microns. Laser power was 50. Total spots was 100.   Post-op The patient tolerated the procedure well. There were no complications. The patient received written and verbal post procedure care education.   Notes (Laser applied superior to FAZ OCT guided, with NAVILAS treatment             ASSESSMENT/PLAN:  Severe nonproliferative diabetic retinopathy of right eye, with macular edema, associated with type 2 diabetes mellitus ([place])  The nature of diabetic macular edema was discussed with the patient. Treatment options were outlined including medical therapy, laser & vitrectomy. The use of injectable medications reviewed, including Avastin, Lucentis, and Eylea. Periodic injections into the eye are likely to resolve diabetic macular edema (swelling in the center of vision). Initially, injections are delivered are delivered every 4-6 weeks, and the interval extended as the condition improves. On average, 8-9 injections the first year, and 5 in year 2. Improvement in the condition most often improves on medical therapy. Occasional use of focal laser is also recommended for residual macular edema (swelling). Excellent control  of blood glucose and blood pressure are encouraged under the care of a primary physician or endocrinologist. Similarly, attempts to maintain serum cholesterol, low density lipoproteins, and high-density lipoproteins in a favorable range were recommended.   Focal applied superior to the fovea and a region treatment nave for CSME      ICD-10-CM   1. Severe nonproliferative diabetic retinopathy of right eye, with macular edema, associated with type 2 diabetes mellitus (HCC)  E11.3411 OCT, Retina - OU - Both Eyes    Focal Laser - OD - Right Eye      1.  Laser delivered OD superior to the FAZ.  We will monitor and examination in 3 to 4 months, retreat with an eye vegF if center involved CSME recurs  2.  OS  as scheduled follow-up for possible injection Eylea 3.  Ophthalmic Meds Ordered this visit:  No orders of the defined types were placed in this encounter.      Return for As scheduled, EYLEA OCT, dilate, OS.  There are no Patient Instructions on file for this visit.   Explained the diagnoses, plan, and follow up with the patient and they expressed understanding.  Patient expressed understanding of the importance of proper follow up care.   Clent Demark Aditya Nastasi M.D. Diseases & Surgery of the Retina and Vitreous Retina & Diabetic [place] 10/09/20     Abbreviations: M myopia (nearsighted); A astigmatism; H hyperopia (farsighted); P presbyopia; Mrx spectacle prescription;  CTL contact lenses; OD right eye; OS left eye; OU both eyes  XT exotropia; ET esotropia; PEK punctate epithelial keratitis; PEE punctate epithelial erosions; DES dry eye syndrome; MGD meibomian gland dysfunction; ATs artificial tears; PFAT's preservative free artificial tears; [place] nuclear sclerotic cataract; PSC posterior subcapsular cataract; ERM epi-retinal membrane; PVD posterior vitreous detachment; RD retinal detachment; DM diabetes mellitus; DR diabetic retinopathy; NPDR non-proliferative diabetic retinopathy;  PDR proliferative diabetic retinopathy; CSME clinically significant macular edema; DME diabetic macular edema; dbh dot blot hemorrhages; CWS cotton wool spot; POAG primary open angle glaucoma; C/D cup-to-disc ratio; HVF humphrey visual field; GVF goldmann visual field; OCT optical coherence tomography; IOP intraocular pressure; BRVO Branch retinal vein occlusion; CRVO central retinal vein occlusion; CRAO central retinal artery occlusion; BRAO branch retinal artery occlusion; RT retinal tear; SB scleral buckle; PPV pars plana vitrectomy; VH Vitreous hemorrhage; PRP panretinal laser photocoagulation; IVK intravitreal kenalog; VMT vitreomacular traction; MH Macular hole;  NVD neovascularization of the disc; NVE neovascularization elsewhere; AREDS age related eye disease study; ARMD age related macular degeneration; POAG primary open angle glaucoma; EBMD epithelial/anterior basement membrane dystrophy; ACIOL anterior chamber intraocular lens; IOL intraocular lens; PCIOL posterior chamber intraocular lens; Phaco/IOL phacoemulsification with intraocular lens placement; [place] photorefractive keratectomy; LASIK laser assisted in situ keratomileusis; HTN hypertension; DM diabetes mellitus; COPD chronic obstructive pulmonary disease

## 2020-10-25 DIAGNOSIS — L4050 Arthropathic psoriasis, unspecified: Secondary | ICD-10-CM | POA: Diagnosis not present

## 2020-11-06 ENCOUNTER — Ambulatory Visit (INDEPENDENT_AMBULATORY_CARE_PROVIDER_SITE_OTHER): Payer: Medicare Other | Admitting: Ophthalmology

## 2020-11-06 ENCOUNTER — Encounter (INDEPENDENT_AMBULATORY_CARE_PROVIDER_SITE_OTHER): Payer: Self-pay | Admitting: Ophthalmology

## 2020-11-06 ENCOUNTER — Other Ambulatory Visit: Payer: Self-pay

## 2020-11-06 DIAGNOSIS — E113411 Type 2 diabetes mellitus with severe nonproliferative diabetic retinopathy with macular edema, right eye: Secondary | ICD-10-CM

## 2020-11-06 DIAGNOSIS — E113412 Type 2 diabetes mellitus with severe nonproliferative diabetic retinopathy with macular edema, left eye: Secondary | ICD-10-CM | POA: Diagnosis not present

## 2020-11-06 MED ORDER — AFLIBERCEPT 2MG/0.05ML IZ SOLN FOR KALEIDOSCOPE
2.0000 mg | INTRAVITREAL | Status: AC | PRN
  Administered 2020-11-06: 2 mg via INTRAVITREAL

## 2020-11-06 NOTE — Assessment & Plan Note (Signed)
OD, noncentral involved CSME superior to the FAZ continues to improve post Eylea but also post recent focal laser treatment 1 month prior.  Will observe

## 2020-11-06 NOTE — Progress Notes (Signed)
11/06/2020     CHIEF COMPLAINT Patient presents for  Chief Complaint  Patient presents with   Retina Follow Up    8 week fu OD and Eylea OD Pt states VA OU stable since last visit. Pt denies FOL, floaters, or ocular pain OU.  A1C: 8.6 LBS: 119       HISTORY OF PRESENT ILLNESS: Shannon Craig is a 70 y.o. female who presents to the clinic today for:   HPI     Retina Follow Up   Patient presents with  Diabetic Retinopathy.  In right eye.  This started 5 weeks ago.  Severity is mild.  Duration of 5 weeks.  Since onset it is stable. Additional comments: 8 week fu OD and Eylea OD Pt states VA OU stable since last visit. Pt denies FOL, floaters, or ocular pain OU.  A1C: 8.6 LBS: 119         Comments   5 week fu os oct eylea os. Patient states vision is stable and unchanged since last visit. Denies any new floaters or FOL.       Last edited by Laurin Coder on 11/06/2020 10:06 AM.      Referring physician: Merrilee Seashore, MD [address] [place],  [place] [address]  HISTORICAL INFORMATION:   Selected notes from the MEDICAL RECORD NUMBER       CURRENT MEDICATIONS: No current outpatient medications on file. (Ophthalmic Drugs)   No current facility-administered medications for this visit. (Ophthalmic Drugs)   Current Outpatient Medications (Other)  Medication Sig   Albiglutide (TANZEUM) 50 MG PEN Inject into the skin.   Alogliptin Benzoate (NESINA) 25 MG TABS Take by mouth.   atorvastatin (LIPITOR) 40 MG tablet Take 40 mg by mouth daily.   Calcium-Vitamin D-Vitamin K (VIACTIV PO) Take 1 tablet by mouth daily.   Canagliflozin (INVOKANA) 300 MG TABS Take 300 mg by mouth daily.   Certolizumab Pegol (CIMZIA) 2 X 200 MG KIT Inject 1 mL into the skin.   diltiazem (CARDIZEM) 120 MG tablet Take by mouth.   empagliflozin (JARDIANCE) 25 MG TABS tablet Take 25 mg by mouth daily.   HYDROcodone-acetaminophen (NORCO/VICODIN) 5-325 MG tablet Take 1  tablet by mouth every 6 (six) hours as needed.   InFLIXimab (REMICADE IV) Inject into the vein.   insulin aspart protamine - aspart (NOVOLOG MIX 70/30 FLEXPEN) (70-30) 100 UNIT/ML FlexPen inject 25 units before breakfast and 10 units before dinner subcutaneously   meloxicam (MOBIC) 15 MG tablet    Molnupiravir 200 MG CAPS Take 4 capules by mouth every 12 hours   Multiple Vitamin (MULTIVITAMIN) tablet Take 1 tablet by mouth daily.   pioglitazone (ACTOS) 15 MG tablet Take 15 mg by mouth daily.   ramipril (ALTACE) 5 MG capsule Take 5 mg by mouth daily.   traMADol (ULTRAM) 50 MG tablet Take 1 tablet (50 mg total) by mouth every 6 (six) hours as needed. (Patient not taking: Reported on 06/02/2019)   No current facility-administered medications for this visit. (Other)      REVIEW OF SYSTEMS:    ALLERGIES Allergies  Allergen Reactions   Darvocet [Propoxyphene N-Acetaminophen]    Gadolinium Derivatives Hives   Methotrexate Derivatives    Percodan [Oxycodone-Aspirin]     PAST MEDICAL HISTORY Past Medical History:  Diagnosis Date   Diabetes mellitus ([place])    Diverticula of colon    External hemorrhoid    Hypertension    Internal hemorrhoid    Kidney  stones    Liver nodule    Nodule of kidney    Past Surgical History:  Procedure Laterality Date   ABDOMINAL HYSTERECTOMY  1991   BREAST REDUCTION SURGERY  1985   HAND SURGERY  2001   left, pins   LASIK  2005   bilateral   LITHOTRIPSY  1983    FAMILY HISTORY Family History  Problem Relation Age of Onset   Breast cancer Mother    Diabetes Father    Prostate cancer Maternal Uncle    Stroke Maternal Grandmother    Heart attack Maternal Grandfather    Diabetes Brother     SOCIAL HISTORY Social History   Tobacco Use   Smoking status: Never   Smokeless tobacco: Never  Substance Use Topics   Alcohol use: No   Drug use: No         OPHTHALMIC EXAM:  Base Eye Exam     Visual Acuity (ETDRS)       Right Left    Dist [place] 20/25 +2 20/30         Tonometry (Tonopen, 10:13 AM)       Right Left   Pressure 14 9         Pupils       Pupils Dark Light Shape React APD   Right PERRL 3 2 Round Brisk None   Left PERRL 3 2 Round Brisk None         Extraocular Movement       Right Left    Full Full         Neuro/Psych     Oriented x3: Yes   Mood/Affect: Normal         Dilation     Left eye: 1.0% Mydriacyl, 2.5% Phenylephrine @ 10:13 AM           Slit Lamp and Fundus Exam     External Exam       Right Left   External Normal Normal         Slit Lamp Exam       Right Left   Lids/Lashes Normal Normal   Conjunctiva/Sclera White and quiet White and quiet   Cornea Clear Clear   Anterior Chamber Deep and quiet Deep and quiet   Iris Round and reactive Round and reactive   Lens Posterior chamber intraocular lens, Open posterior capsule Posterior chamber intraocular lens, Open posterior capsule   Anterior Vitreous Normal Normal         Fundus Exam       Right Left   Posterior Vitreous  Normal   Disc  Normal   C/D Ratio  0.5   Macula  No Macular thickening, focal clinically significant macular edema, superior to the FAZ region, exudates, Microaneurysms, Mild clinically significant macular edema   Vessels  Retinopathy, severe nonproliferative diabetic retinopathy with limited peripheral PRP room temporally and superiorly for more laser   Periphery  Peripheral retinal nonperfusion nasal and inferior            IMAGING AND PROCEDURES  Imaging and Procedures for 11/06/20  OCT, Retina - OU - Both Eyes       Right Eye Quality was good. Scan locations included subfoveal. Central Foveal Thickness: 217.   Left Eye Quality was good. Scan locations included subfoveal. Central Foveal Thickness: 204. Findings include abnormal foveal contour, epiretinal membrane.   Notes OS minor residual retinal thickening mostly now superior and superonasal to FAZ. Thickening is  from epiretinal  membrane in this region OS.  Overall center involved CSME vastly improved as compared to last examination and injection OS 10-02-2020  OD region superior to the fovea vastly improved and at 7 weeks post intravitreal Eylea Will repeat evaluation OD as scheduled may need focal laser treatment superiorly     Intravitreal Injection, Pharmacologic Agent - OS - Left Eye       Time Out 11/06/2020. 10:55 AM. Confirmed correct patient, procedure, site, and patient consented.   Anesthesia Topical anesthesia was used. Anesthetic medications included [place] 3.5%.   Procedure Preparation included 5% betadine to ocular surface, 10% betadine to eyelids, Ofloxacin . A 30 gauge needle was used.   Injection: 2 mg aflibercept 2 MG/0.05ML   Route: Intravitreal, Site: Left Eye   NDC: [id number], Lot: [id number], Waste: 0 mL   Post-op Post injection exam found visual acuity of at least counting fingers. The patient tolerated the procedure well. There were no complications. The patient received written and verbal post procedure care education. Post injection medications included ocuflox.              ASSESSMENT/PLAN:  Severe nonproliferative diabetic retinopathy of right eye, with macular edema, associated with type 2 diabetes mellitus (HCC) OD, noncentral involved CSME superior to the FAZ continues to improve post Eylea but also post recent focal laser treatment 1 month prior.  Will observe  Severe nonproliferative diabetic retinopathy of left eye, with macular edema, associated with type 2 diabetes mellitus ([place]) OS today vastly improved center involved CSME at 5-week interval.  We will repeat injection today and inject next visit will be in 6 to 7 weeks     ICD-10-CM   1. Severe nonproliferative diabetic retinopathy of left eye, with macular edema, associated with type 2 diabetes mellitus (HCC)  V74.8270 OCT, Retina - OU - Both Eyes    Intravitreal Injection, Pharmacologic Agent  - OS - Left Eye    aflibercept (EYLEA) SOLN 2 mg    2. Severe nonproliferative diabetic retinopathy of right eye, with macular edema, associated with type 2 diabetes mellitus ([place])  E11.3411       1.  2.  3.  Ophthalmic Meds Ordered this visit:  Meds ordered this encounter  Medications   aflibercept (EYLEA) SOLN 2 mg       Return in about 6 weeks (around 12/18/2020) for dilate, OS, EYLEA OCT.  There are no Patient Instructions on file for this visit.   Explained the diagnoses, plan, and follow up with the patient and they expressed understanding.  Patient expressed understanding of the importance of proper follow up care.   Clent Demark Myrikal Messmer M.D. Diseases & Surgery of the Retina and Vitreous Retina & Diabetic [place] 11/06/20     Abbreviations: M myopia (nearsighted); A astigmatism; H hyperopia (farsighted); P presbyopia; Mrx spectacle prescription;  CTL contact lenses; OD right eye; OS left eye; OU both eyes  XT exotropia; ET esotropia; PEK punctate epithelial keratitis; PEE punctate epithelial erosions; DES dry eye syndrome; MGD meibomian gland dysfunction; ATs artificial tears; PFAT's preservative free artificial tears; [place] nuclear sclerotic cataract; PSC posterior subcapsular cataract; ERM epi-retinal membrane; PVD posterior vitreous detachment; RD retinal detachment; DM diabetes mellitus; DR diabetic retinopathy; NPDR non-proliferative diabetic retinopathy; PDR proliferative diabetic retinopathy; CSME clinically significant macular edema; DME diabetic macular edema; dbh dot blot hemorrhages; CWS cotton wool spot; POAG primary open angle glaucoma; C/D cup-to-disc ratio; HVF humphrey visual field; GVF goldmann visual field; OCT optical coherence tomography; IOP intraocular pressure; BRVO  Branch retinal vein occlusion; CRVO central retinal vein occlusion; CRAO central retinal artery occlusion; BRAO branch retinal artery occlusion; RT retinal tear; SB scleral buckle; PPV pars  plana vitrectomy; VH Vitreous hemorrhage; PRP panretinal laser photocoagulation; IVK intravitreal kenalog; VMT vitreomacular traction; MH Macular hole;  NVD neovascularization of the disc; NVE neovascularization elsewhere; AREDS age related eye disease study; ARMD age related macular degeneration; POAG primary open angle glaucoma; EBMD epithelial/anterior basement membrane dystrophy; ACIOL anterior chamber intraocular lens; IOL intraocular lens; PCIOL posterior chamber intraocular lens; Phaco/IOL phacoemulsification with intraocular lens placement; [place] photorefractive keratectomy; LASIK laser assisted in situ keratomileusis; HTN hypertension; DM diabetes mellitus; COPD chronic obstructive pulmonary disease

## 2020-11-06 NOTE — Assessment & Plan Note (Signed)
OS today vastly improved center involved CSME at 5-week interval.  We will repeat injection today and inject next visit will be in 6 to 7 weeks

## 2020-11-22 DIAGNOSIS — E782 Mixed hyperlipidemia: Secondary | ICD-10-CM | POA: Diagnosis not present

## 2020-11-22 DIAGNOSIS — R5383 Other fatigue: Secondary | ICD-10-CM | POA: Diagnosis not present

## 2020-11-22 DIAGNOSIS — L4050 Arthropathic psoriasis, unspecified: Secondary | ICD-10-CM | POA: Diagnosis not present

## 2020-11-22 DIAGNOSIS — E118 Type 2 diabetes mellitus with unspecified complications: Secondary | ICD-10-CM | POA: Diagnosis not present

## 2020-11-22 DIAGNOSIS — I10 Essential (primary) hypertension: Secondary | ICD-10-CM | POA: Diagnosis not present

## 2020-11-28 DIAGNOSIS — I700 Atherosclerosis of aorta: Secondary | ICD-10-CM | POA: Diagnosis not present

## 2020-11-28 DIAGNOSIS — M810 Age-related osteoporosis without current pathological fracture: Secondary | ICD-10-CM | POA: Diagnosis not present

## 2020-11-28 DIAGNOSIS — L4050 Arthropathic psoriasis, unspecified: Secondary | ICD-10-CM | POA: Diagnosis not present

## 2020-11-28 DIAGNOSIS — E1140 Type 2 diabetes mellitus with diabetic neuropathy, unspecified: Secondary | ICD-10-CM | POA: Diagnosis not present

## 2020-11-28 DIAGNOSIS — R29818 Other symptoms and signs involving the nervous system: Secondary | ICD-10-CM | POA: Diagnosis not present

## 2020-11-28 DIAGNOSIS — Z794 Long term (current) use of insulin: Secondary | ICD-10-CM | POA: Diagnosis not present

## 2020-11-28 DIAGNOSIS — E113413 Type 2 diabetes mellitus with severe nonproliferative diabetic retinopathy with macular edema, bilateral: Secondary | ICD-10-CM | POA: Diagnosis not present

## 2020-11-28 DIAGNOSIS — Z0000 Encounter for general adult medical examination without abnormal findings: Secondary | ICD-10-CM | POA: Diagnosis not present

## 2020-11-28 DIAGNOSIS — E782 Mixed hyperlipidemia: Secondary | ICD-10-CM | POA: Diagnosis not present

## 2020-11-28 DIAGNOSIS — I10 Essential (primary) hypertension: Secondary | ICD-10-CM | POA: Diagnosis not present

## 2020-11-28 DIAGNOSIS — Z23 Encounter for immunization: Secondary | ICD-10-CM | POA: Diagnosis not present

## 2020-12-04 DIAGNOSIS — L4050 Arthropathic psoriasis, unspecified: Secondary | ICD-10-CM | POA: Diagnosis not present

## 2020-12-04 DIAGNOSIS — Z79899 Other long term (current) drug therapy: Secondary | ICD-10-CM | POA: Diagnosis not present

## 2020-12-04 DIAGNOSIS — E559 Vitamin D deficiency, unspecified: Secondary | ICD-10-CM | POA: Diagnosis not present

## 2020-12-04 DIAGNOSIS — M150 Primary generalized (osteo)arthritis: Secondary | ICD-10-CM | POA: Diagnosis not present

## 2020-12-04 DIAGNOSIS — M7060 Trochanteric bursitis, unspecified hip: Secondary | ICD-10-CM | POA: Diagnosis not present

## 2020-12-04 DIAGNOSIS — M79643 Pain in unspecified hand: Secondary | ICD-10-CM | POA: Diagnosis not present

## 2020-12-04 DIAGNOSIS — M810 Age-related osteoporosis without current pathological fracture: Secondary | ICD-10-CM | POA: Diagnosis not present

## 2020-12-04 DIAGNOSIS — D696 Thrombocytopenia, unspecified: Secondary | ICD-10-CM | POA: Diagnosis not present

## 2020-12-06 DIAGNOSIS — E1140 Type 2 diabetes mellitus with diabetic neuropathy, unspecified: Secondary | ICD-10-CM | POA: Diagnosis not present

## 2020-12-06 DIAGNOSIS — I10 Essential (primary) hypertension: Secondary | ICD-10-CM | POA: Diagnosis not present

## 2020-12-06 DIAGNOSIS — M810 Age-related osteoporosis without current pathological fracture: Secondary | ICD-10-CM | POA: Diagnosis not present

## 2020-12-06 DIAGNOSIS — E782 Mixed hyperlipidemia: Secondary | ICD-10-CM | POA: Diagnosis not present

## 2020-12-06 DIAGNOSIS — Z9641 Presence of insulin pump (external) (internal): Secondary | ICD-10-CM | POA: Diagnosis not present

## 2020-12-06 DIAGNOSIS — E1169 Type 2 diabetes mellitus with other specified complication: Secondary | ICD-10-CM | POA: Diagnosis not present

## 2020-12-14 DIAGNOSIS — E119 Type 2 diabetes mellitus without complications: Secondary | ICD-10-CM | POA: Diagnosis not present

## 2020-12-14 DIAGNOSIS — I10 Essential (primary) hypertension: Secondary | ICD-10-CM | POA: Diagnosis not present

## 2020-12-14 DIAGNOSIS — G4719 Other hypersomnia: Secondary | ICD-10-CM | POA: Diagnosis not present

## 2020-12-14 DIAGNOSIS — G4733 Obstructive sleep apnea (adult) (pediatric): Secondary | ICD-10-CM | POA: Diagnosis not present

## 2020-12-18 ENCOUNTER — Encounter (INDEPENDENT_AMBULATORY_CARE_PROVIDER_SITE_OTHER): Payer: Medicare Other | Admitting: Ophthalmology

## 2020-12-18 ENCOUNTER — Other Ambulatory Visit: Payer: Self-pay

## 2020-12-18 ENCOUNTER — Encounter (INDEPENDENT_AMBULATORY_CARE_PROVIDER_SITE_OTHER): Payer: Self-pay | Admitting: Ophthalmology

## 2020-12-18 ENCOUNTER — Ambulatory Visit (INDEPENDENT_AMBULATORY_CARE_PROVIDER_SITE_OTHER): Payer: Medicare Other | Admitting: Ophthalmology

## 2020-12-18 DIAGNOSIS — E113412 Type 2 diabetes mellitus with severe nonproliferative diabetic retinopathy with macular edema, left eye: Secondary | ICD-10-CM

## 2020-12-18 DIAGNOSIS — E113411 Type 2 diabetes mellitus with severe nonproliferative diabetic retinopathy with macular edema, right eye: Secondary | ICD-10-CM

## 2020-12-18 MED ORDER — AFLIBERCEPT 2MG/0.05ML IZ SOLN FOR KALEIDOSCOPE
2.0000 mg | INTRAVITREAL | Status: AC | PRN
  Administered 2020-12-18: 2 mg via INTRAVITREAL

## 2020-12-18 NOTE — Assessment & Plan Note (Signed)
No return of CSME OD will continue to monitor and observe

## 2020-12-18 NOTE — Progress Notes (Signed)
12/18/2020     CHIEF COMPLAINT Patient presents for  Chief Complaint  Patient presents with   Retina Follow Up      HISTORY OF PRESENT ILLNESS: Shannon Craig is a 70 y.o. female who presents to the clinic today for:   HPI     Retina Follow Up   Patient presents with  Diabetic Retinopathy.  In left eye.  This started 6 weeks ago.  Severity is mild.  Duration of 6 weeks.  Since onset it is stable.        Comments   6 week fu os and oct and eylea os Pt states VA OU stable since last visit. Pt denies FOL, floaters, or ocular pain OU.  A1C:8.2 LBS:116       Last edited by Kendra Opitz, COA on 12/18/2020  3:02 PM.      Referring physician: Merrilee Seashore, [address],  [place] [id number]  HISTORICAL INFORMATION:   Selected notes from the MEDICAL RECORD NUMBER       CURRENT MEDICATIONS: No current outpatient medications on file. (Ophthalmic Drugs)   No current facility-administered medications for this visit. (Ophthalmic Drugs)   Current Outpatient Medications (Other)  Medication Sig   Albiglutide (TANZEUM) 50 MG PEN Inject into the skin.   Alogliptin Benzoate (NESINA) 25 MG TABS Take by mouth.   atorvastatin (LIPITOR) 40 MG tablet Take 40 mg by mouth daily.   Calcium-Vitamin D-Vitamin K (VIACTIV PO) Take 1 tablet by mouth daily.   Canagliflozin (INVOKANA) 300 MG TABS Take 300 mg by mouth daily.   Certolizumab Pegol (CIMZIA) 2 X 200 MG KIT Inject 1 mL into the skin.   diltiazem (CARDIZEM) 120 MG tablet Take by mouth.   empagliflozin (JARDIANCE) 25 MG TABS tablet Take 25 mg by mouth daily.   HYDROcodone-acetaminophen (NORCO/VICODIN) 5-325 MG tablet Take 1 tablet by mouth every 6 (six) hours as needed.   InFLIXimab (REMICADE IV) Inject into the vein.   insulin aspart protamine - aspart (NOVOLOG MIX 70/30 FLEXPEN) (70-30) 100 UNIT/ML FlexPen inject 25 units before breakfast and 10 units before dinner subcutaneously   meloxicam  (MOBIC) 15 MG tablet    Molnupiravir 200 MG CAPS Take 4 capules by mouth every 12 hours   Multiple Vitamin (MULTIVITAMIN) tablet Take 1 tablet by mouth daily.   pioglitazone (ACTOS) 15 MG tablet Take 15 mg by mouth daily.   ramipril (ALTACE) 5 MG capsule Take 5 mg by mouth daily.   traMADol (ULTRAM) 50 MG tablet Take 1 tablet (50 mg total) by mouth every 6 (six) hours as needed. (Patient not taking: Reported on 06/02/2019)   No current facility-administered medications for this visit. (Other)      REVIEW OF SYSTEMS:    ALLERGIES Allergies  Allergen Reactions   Darvocet [Propoxyphene N-Acetaminophen]    Gadolinium Derivatives Hives   Methotrexate Derivatives    Percodan [Oxycodone-Aspirin]     PAST MEDICAL HISTORY Past Medical History:  Diagnosis Date   Diabetes mellitus ([place])    Diverticula of colon    External hemorrhoid    Hypertension    Internal hemorrhoid    Kidney stones    Liver nodule    Nodule of kidney    Past Surgical History:  Procedure Laterality Date   [place]   HAND SURGERY  2001   left, pins   LASIK  2005   bilateral   LITHOTRIPSY  1983    FAMILY HISTORY Family History  Problem Relation Age of Onset   Breast cancer Mother    Diabetes Father    Prostate cancer Maternal Uncle    Stroke Maternal Grandmother    Heart attack Maternal Grandfather    Diabetes Brother     SOCIAL HISTORY Social History   Tobacco Use   Smoking status: Never   Smokeless tobacco: Never  Substance Use Topics   Alcohol use: No   Drug use: No         OPHTHALMIC EXAM:  Base Eye Exam     Visual Acuity (ETDRS)       Right Left   Dist [place] 20/20 -1 20/25 -1         Tonometry (Tonopen, 3:06 PM)       Right Left   Pressure 14 11         Pupils       Pupils Dark Light Shape React APD   Right PERRL 3 2 Round Brisk None   Left PERRL 3 2 Round Brisk None         Visual Fields       Left  Right   Restrictions  Partial outer inferior temporal, inferior nasal deficiencies         Neuro/Psych     Oriented x3: Yes   Mood/Affect: Normal         Dilation     Left eye: 1.0% Mydriacyl, 2.5% Phenylephrine @ 3:06 PM           Slit Lamp and Fundus Exam     External Exam       Right Left   External Normal Normal         Slit Lamp Exam       Right Left   Lids/Lashes Normal Normal   Conjunctiva/Sclera White and quiet White and quiet   Cornea Clear Clear   Anterior Chamber Deep and quiet Deep and quiet   Iris Round and reactive Round and reactive   Lens Posterior chamber intraocular lens, Open posterior capsule Posterior chamber intraocular lens, Open posterior capsule   Anterior Vitreous Normal Normal         Fundus Exam       Right Left   Posterior Vitreous  Normal   Disc  Normal   C/D Ratio  0.5   Macula  No Macular thickening, focal clinically significant macular edema, superior to the FAZ region, exudates, Microaneurysms, Mild clinically significant macular edema   Vessels  Retinopathy, severe nonproliferative diabetic retinopathy with limited peripheral PRP room temporally and superiorly for more laser   Periphery  Peripheral retinal nonperfusion nasal and inferior            IMAGING AND PROCEDURES  Imaging and Procedures for 12/18/20  OCT, Retina - OU - Both Eyes       Right Eye Quality was good. Scan locations included subfoveal. Central Foveal Thickness: 234.   Left Eye Quality was good. Scan locations included subfoveal. Central Foveal Thickness: 203. Findings include abnormal foveal contour, epiretinal membrane.   Notes OS minor residual retinal thickening mostly now superior and superonasal to FAZ. Thickening is from epiretinal membrane in this region OS.  Overall center involved CSME vastly improved as compared to last examination and injection OS 10-02-2020  OD region superior to the fovea vastly improved and at 7 weeks post  intravitreal Eylea Will repeat evaluation OD as scheduled may need focal laser treatment superiorly  Intravitreal Injection, Pharmacologic Agent - OS - Left Eye       Time Out 12/18/2020. 4:19 PM. Confirmed correct patient, procedure, site, and patient consented.   Anesthesia Topical anesthesia was used. Anesthetic medications included Lidocaine 4%.   Procedure Preparation included 5% betadine to ocular surface, 10% betadine to eyelids, Tobramycin 0.3%. A 30 gauge needle was used.   Injection: 2 mg aflibercept 2 MG/0.05ML   Route: Intravitreal, Site: Left Eye   NDC: A3590391, Lot: [id number], Waste: 0 mL   Post-op Post injection exam found visual acuity of at least counting fingers. The patient tolerated the procedure well. There were no complications. The patient received written and verbal post procedure care education. Post injection medications included ocuflox.              ASSESSMENT/PLAN:  Severe nonproliferative diabetic retinopathy of left eye, with macular edema, associated with type 2 diabetes mellitus (HCC) At 6-week interval follow-up today CSME vastly improved. Minor thickening remains superonasal which could be related to a minor epiretinal membrane in this region we will repeat injection Eylea today and follow-up next in 8 weeks  Severe nonproliferative diabetic retinopathy of right eye, with macular edema, associated with type 2 diabetes mellitus ([place]) No return of CSME OD will continue to monitor and observe      ICD-10-CM   1. Severe nonproliferative diabetic retinopathy of left eye, with macular edema, associated with type 2 diabetes mellitus (HCC)  U44.0347 OCT, Retina - OU - Both Eyes    Intravitreal Injection, Pharmacologic Agent - OS - Left Eye    aflibercept (EYLEA) SOLN 2 mg    2. Severe nonproliferative diabetic retinopathy of right eye, with macular edema, associated with type 2 diabetes mellitus ([place])  E11.3411       1.  OS vastly  improved CSME now post Eylea but also post peripheral PRP.  Repeat injection today to maintain findings and examination next in 8 weeks  2.  Dilate OU next in 8 weeks likely injection Eylea OS  3.  Ophthalmic Meds Ordered this visit:  Meds ordered this encounter  Medications   aflibercept (EYLEA) SOLN 2 mg       Return in about 8 weeks (around 02/12/2021) for EYLEA OCT, OS, DILATE OU.  There are no Patient Instructions on file for this visit.   Explained the diagnoses, plan, and follow up with the patient and they expressed understanding.  Patient expressed understanding of the importance of proper follow up care.   Clent Demark Aquilla Shambley M.D. Diseases & Surgery of the Retina and Vitreous Retina & Diabetic [place] 12/18/20     Abbreviations: M myopia (nearsighted); A astigmatism; H hyperopia (farsighted); P presbyopia; Mrx spectacle prescription;  CTL contact lenses; OD right eye; OS left eye; OU both eyes  XT exotropia; ET esotropia; PEK punctate epithelial keratitis; PEE punctate epithelial erosions; DES dry eye syndrome; MGD meibomian gland dysfunction; ATs artificial tears; PFAT's preservative free artificial tears; [place] nuclear sclerotic cataract; PSC posterior subcapsular cataract; ERM epi-retinal membrane; PVD posterior vitreous detachment; RD retinal detachment; DM diabetes mellitus; DR diabetic retinopathy; NPDR non-proliferative diabetic retinopathy; PDR proliferative diabetic retinopathy; CSME clinically significant macular edema; DME diabetic macular edema; dbh dot blot hemorrhages; CWS cotton wool spot; POAG primary open angle glaucoma; C/D cup-to-disc ratio; HVF humphrey visual field; GVF goldmann visual field; OCT optical coherence tomography; IOP intraocular pressure; BRVO Branch retinal vein occlusion; CRVO central retinal vein occlusion; CRAO central retinal artery occlusion; BRAO branch retinal artery occlusion; RT  retinal tear; SB scleral buckle; PPV pars plana vitrectomy;  VH Vitreous hemorrhage; PRP panretinal laser photocoagulation; IVK intravitreal kenalog; VMT vitreomacular traction; MH Macular hole;  NVD neovascularization of the disc; NVE neovascularization elsewhere; AREDS age related eye disease study; ARMD age related macular degeneration; POAG primary open angle glaucoma; EBMD epithelial/anterior basement membrane dystrophy; ACIOL anterior chamber intraocular lens; IOL intraocular lens; PCIOL posterior chamber intraocular lens; Phaco/IOL phacoemulsification with intraocular lens placement; [place] photorefractive keratectomy; LASIK laser assisted in situ keratomileusis; HTN hypertension; DM diabetes mellitus; COPD chronic obstructive pulmonary disease

## 2020-12-18 NOTE — Assessment & Plan Note (Signed)
At 6-week interval follow-up today CSME vastly improved. Minor thickening remains superonasal which could be related to a minor epiretinal membrane in this region we will repeat injection Eylea today and follow-up next in 8 weeks

## 2020-12-20 DIAGNOSIS — L4050 Arthropathic psoriasis, unspecified: Secondary | ICD-10-CM | POA: Diagnosis not present

## 2020-12-31 DIAGNOSIS — Z20828 Contact with and (suspected) exposure to other viral communicable diseases: Secondary | ICD-10-CM | POA: Diagnosis not present

## 2021-01-07 IMAGING — DX DG RIBS W/ CHEST 3+V*R*
3 series · 3 of 3 positions shown · non-contrast
Comparison: Chest CT dated 10/05/2013

CLINICAL DATA: 61-year-old female with fall and right chest wall
pain.

EXAM:
RIGHT RIBS AND CHEST - 3+ VIEW

[chest pa]
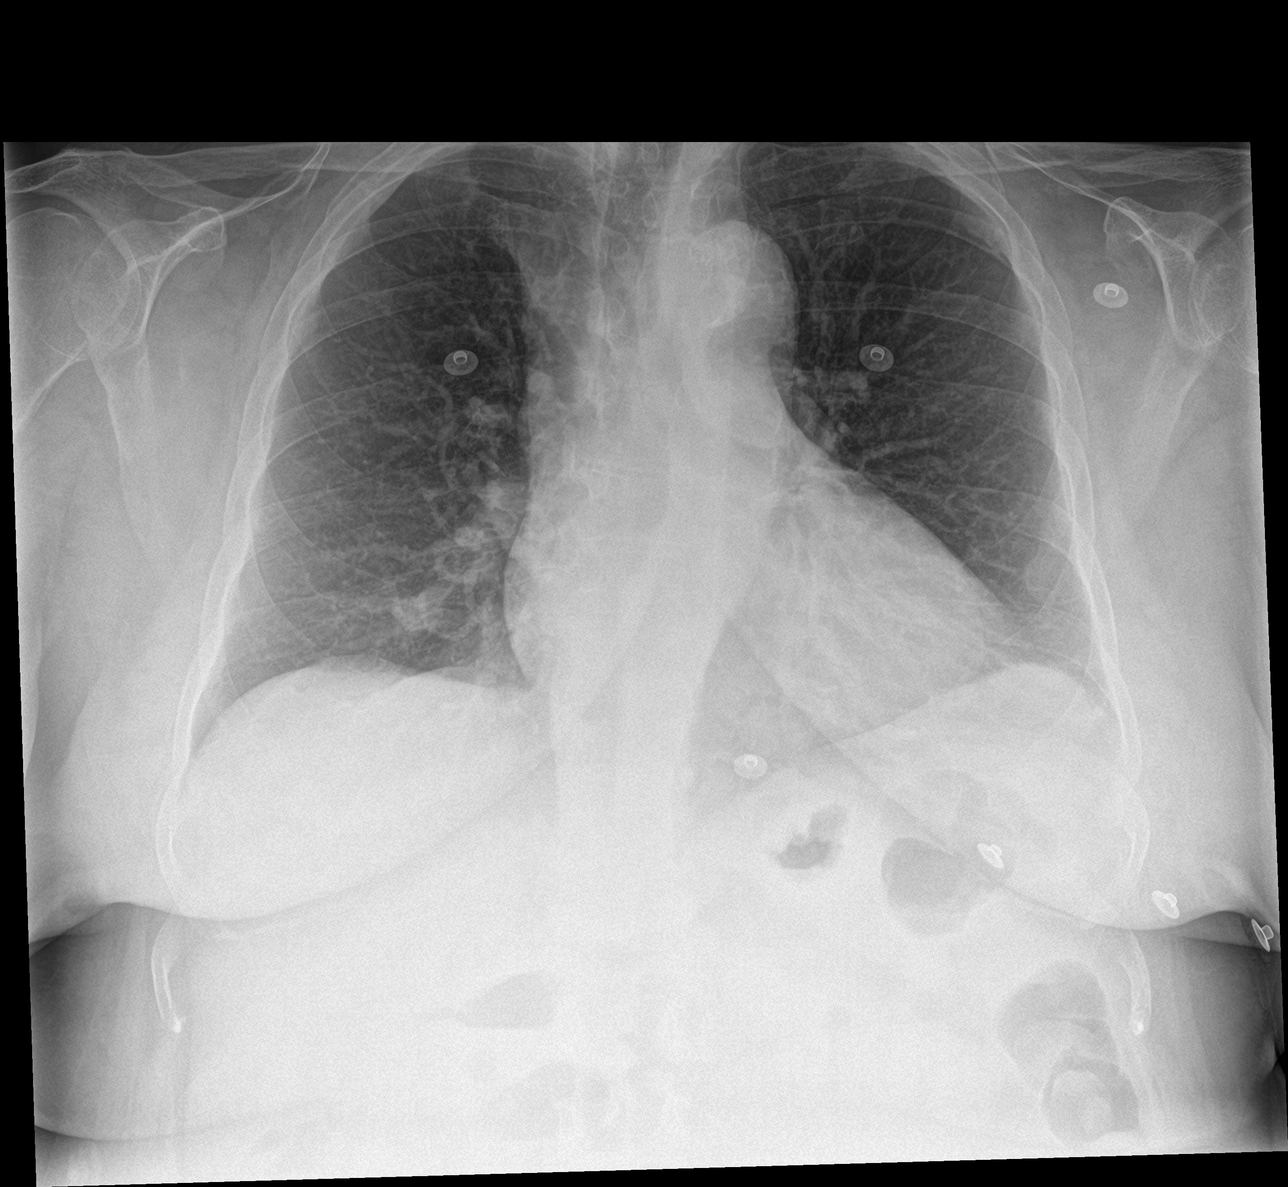

[rib pa]
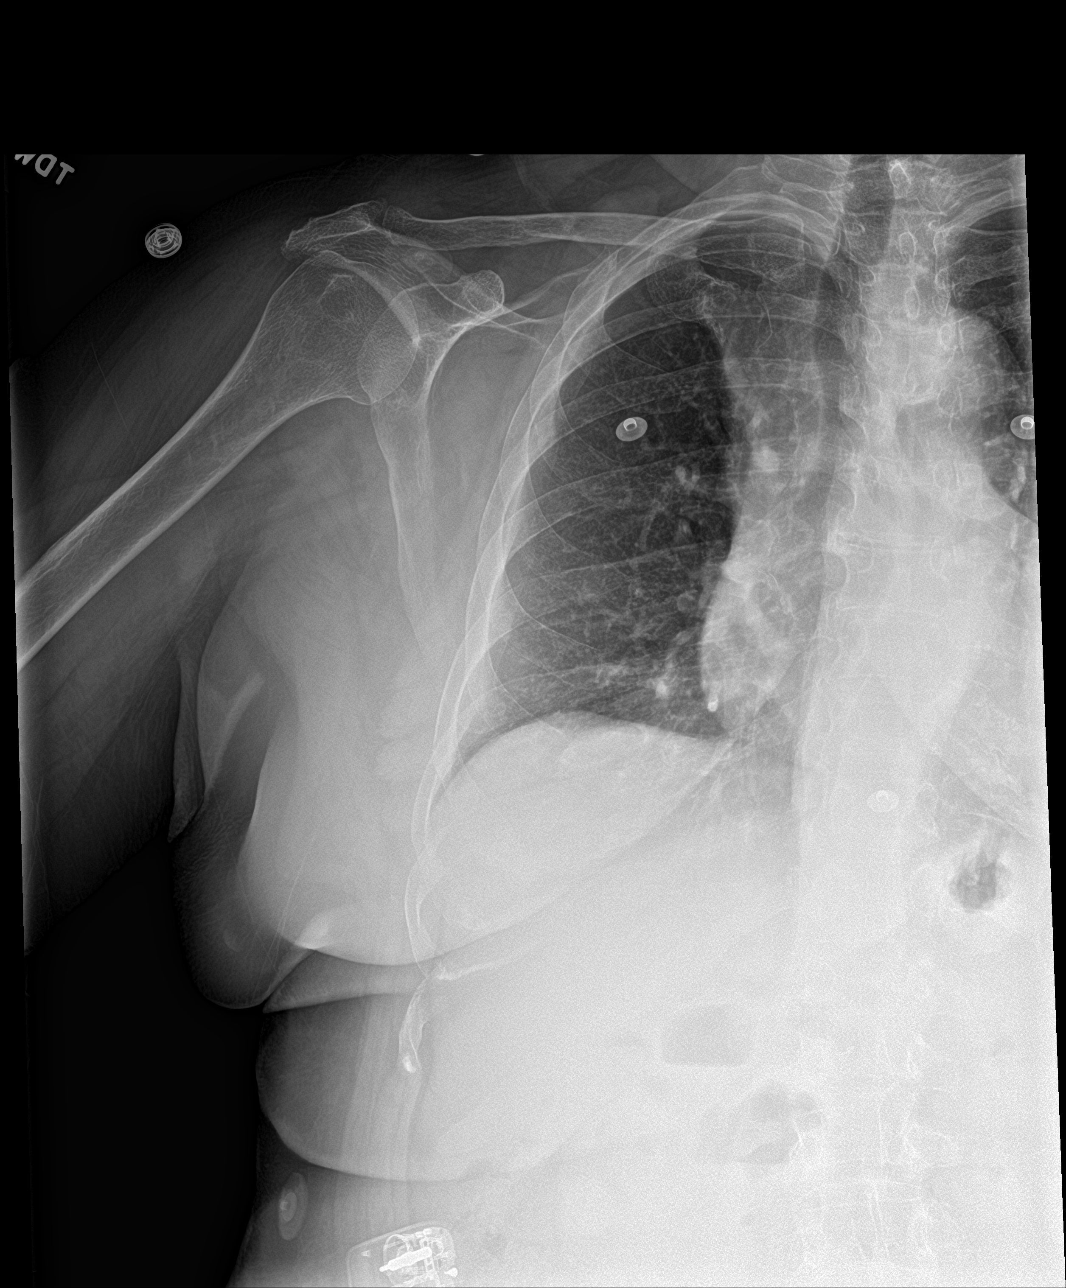

[rib pa obl]
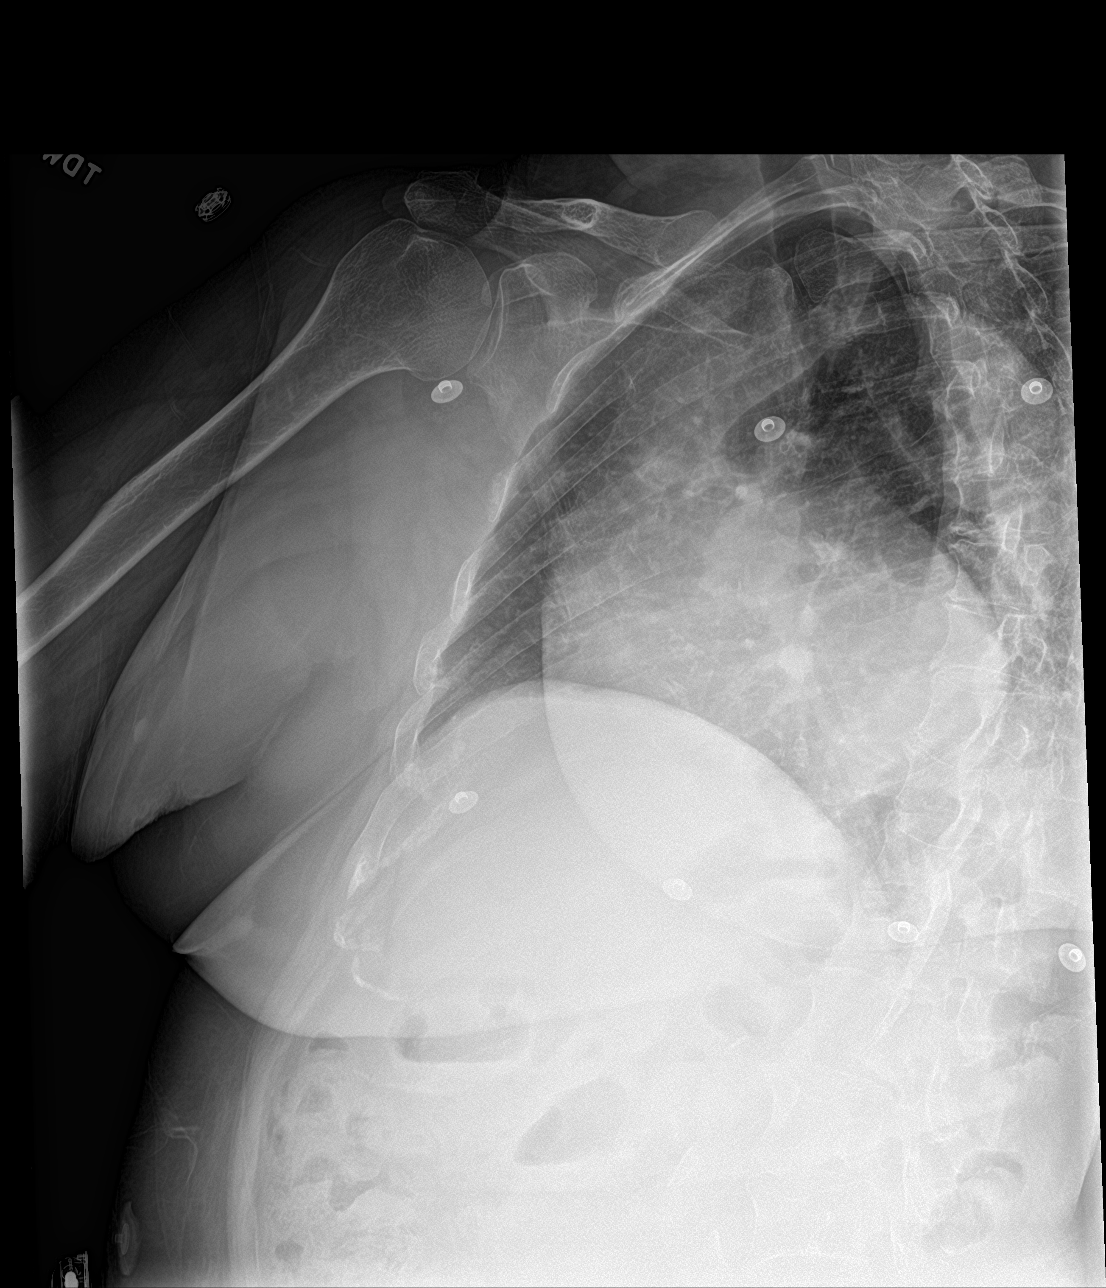

[3 of 3 positions shown; findings below may reference images not displayed]

FINDINGS: No focal consolidation, pleural effusion or pneumothorax. Top-normal
cardiac size. Atherosclerotic calcification of the aorta.
Osteopenia. No acute osseous pathology. No displaced rib fracture.
IMPRESSION: 1. No acute cardiopulmonary process.
2. No displaced rib fracture.

## 2021-01-07 IMAGING — DX DG SHOULDER 2+V*R*
3 series · 3 of 3 positions shown · non-contrast
Comparison: None.

CLINICAL DATA: Fall

EXAM:
RIGHT SHOULDER - 2+ VIEW

[shoulder grashey]
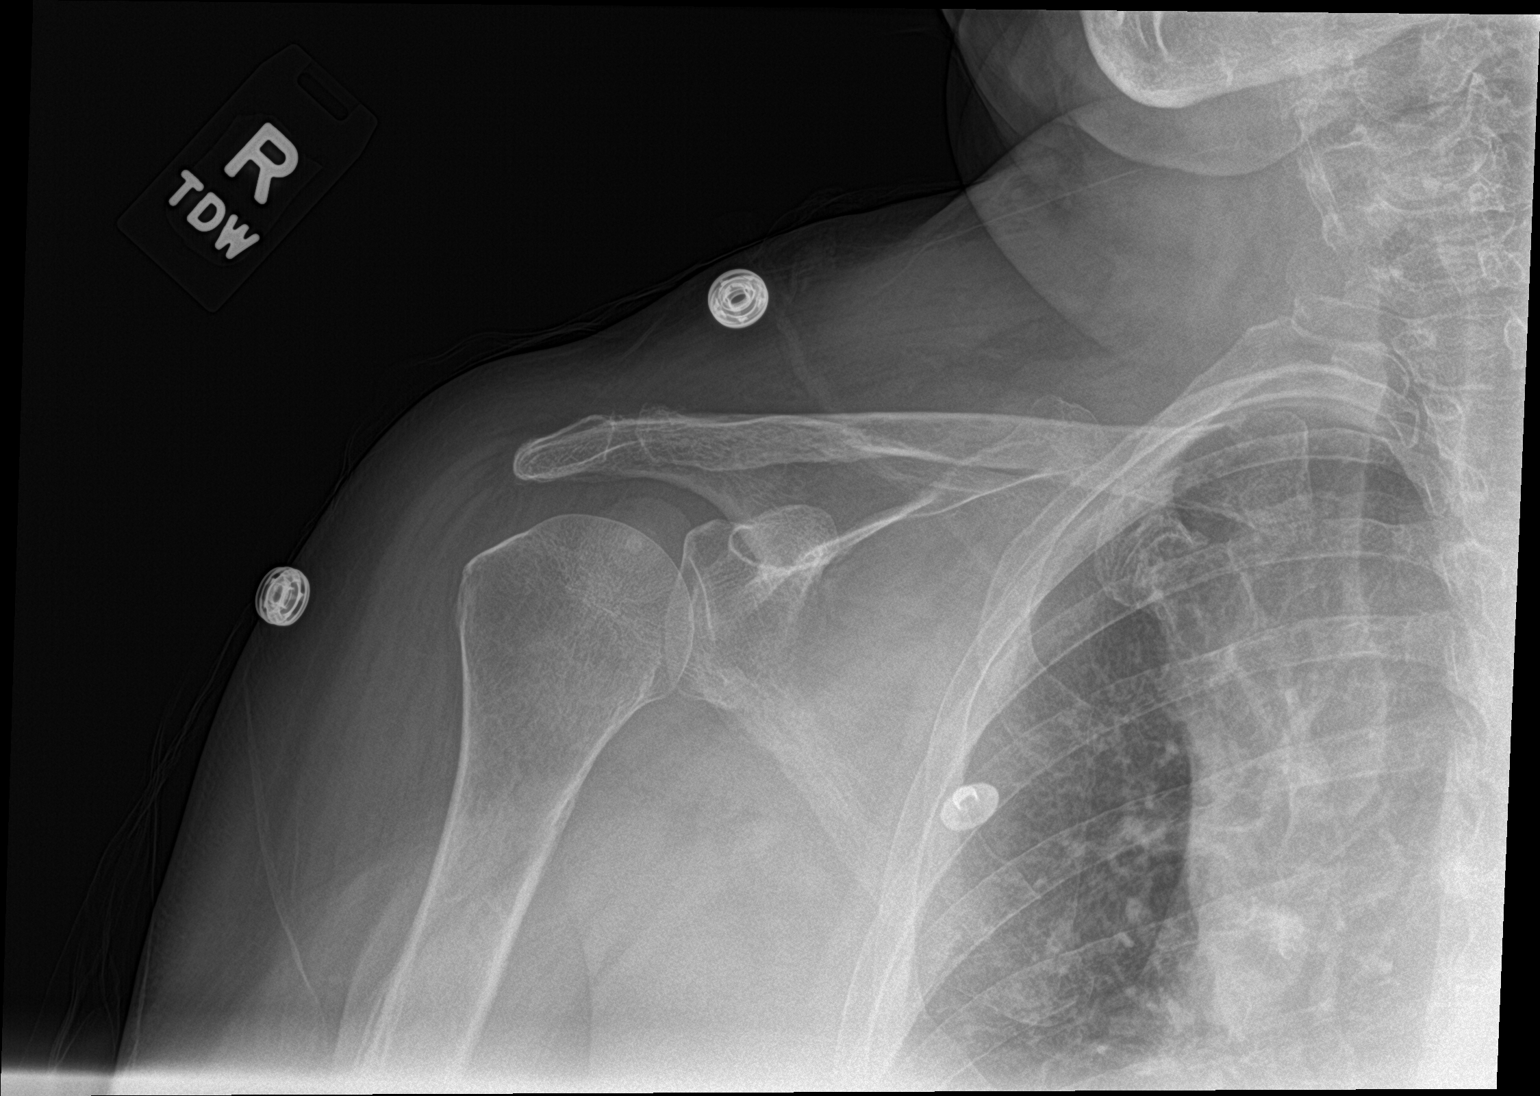

[shoulder y view]
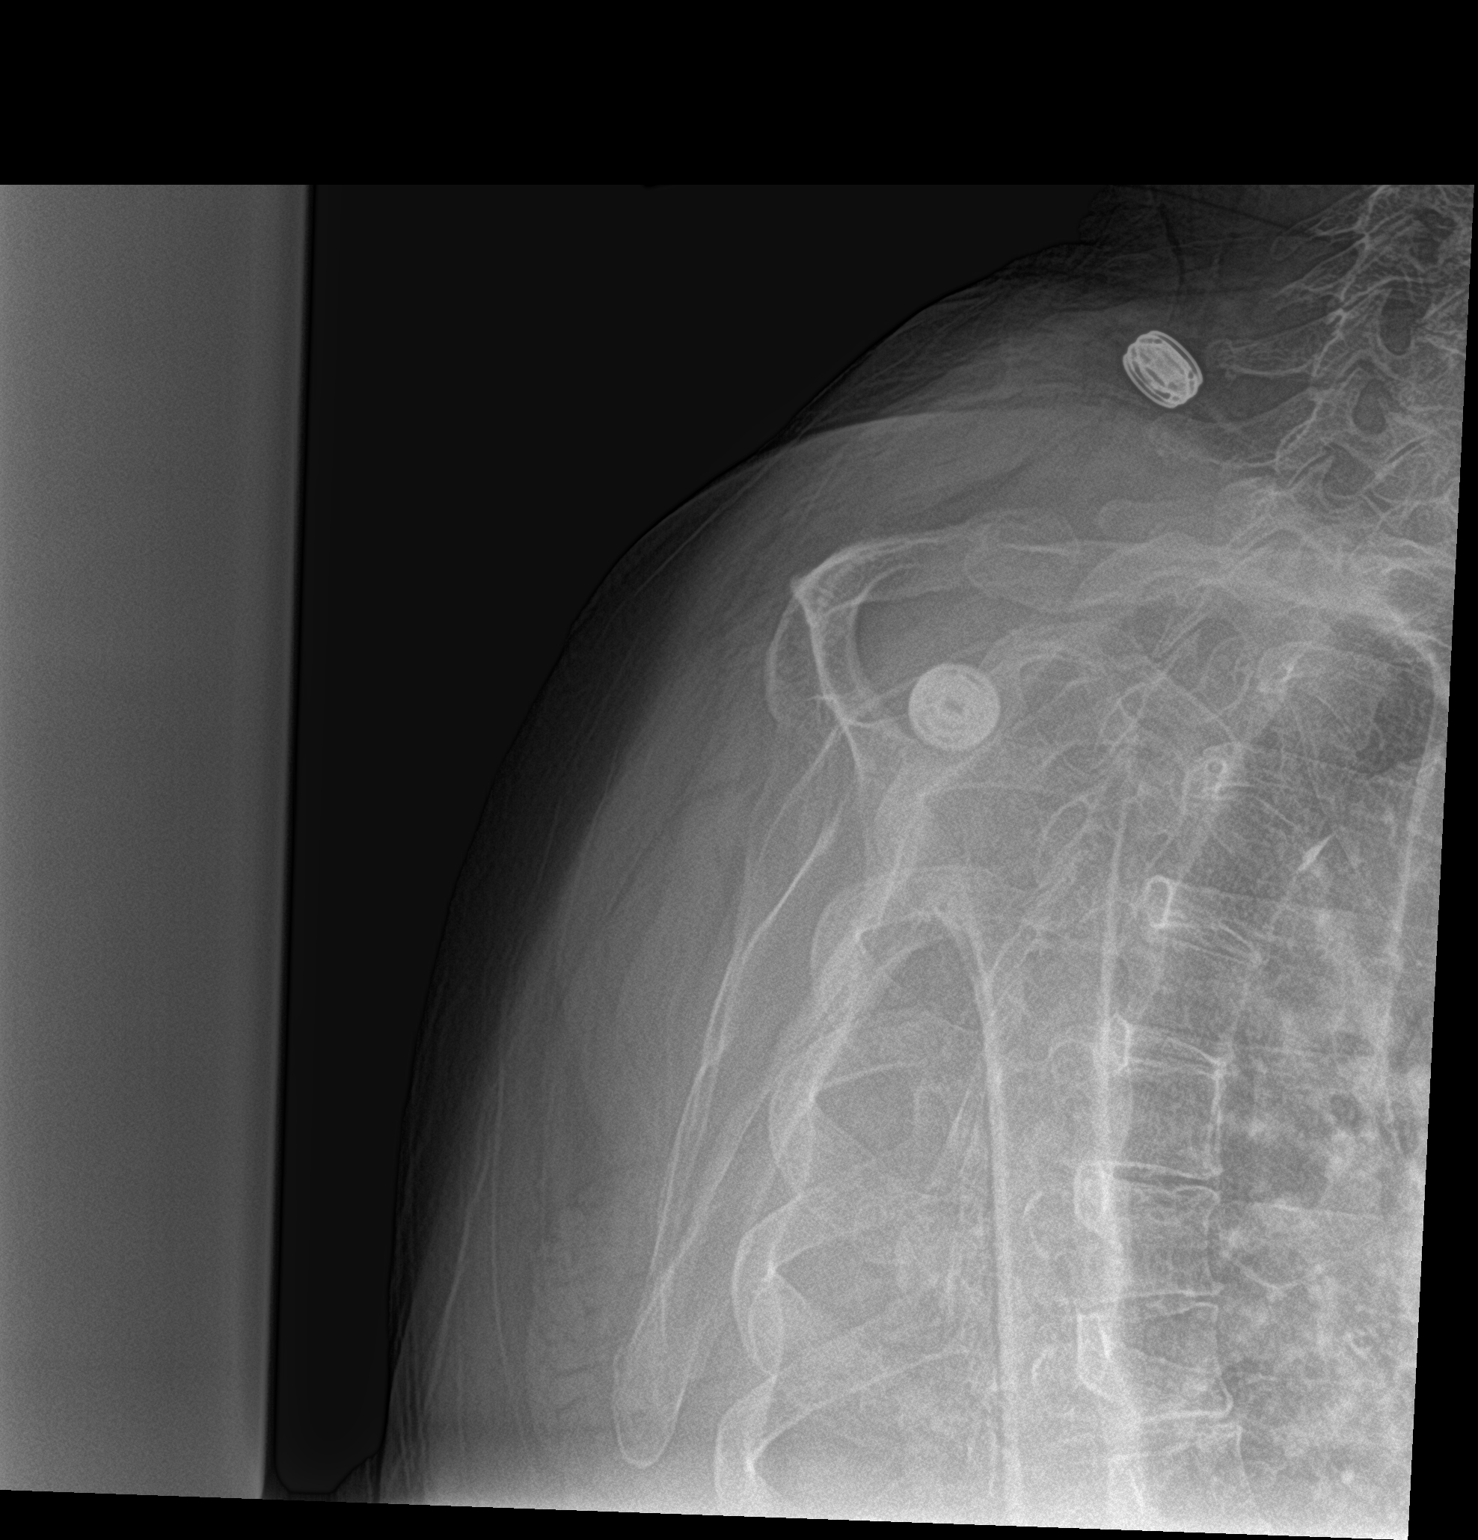

[shoulder axillary]
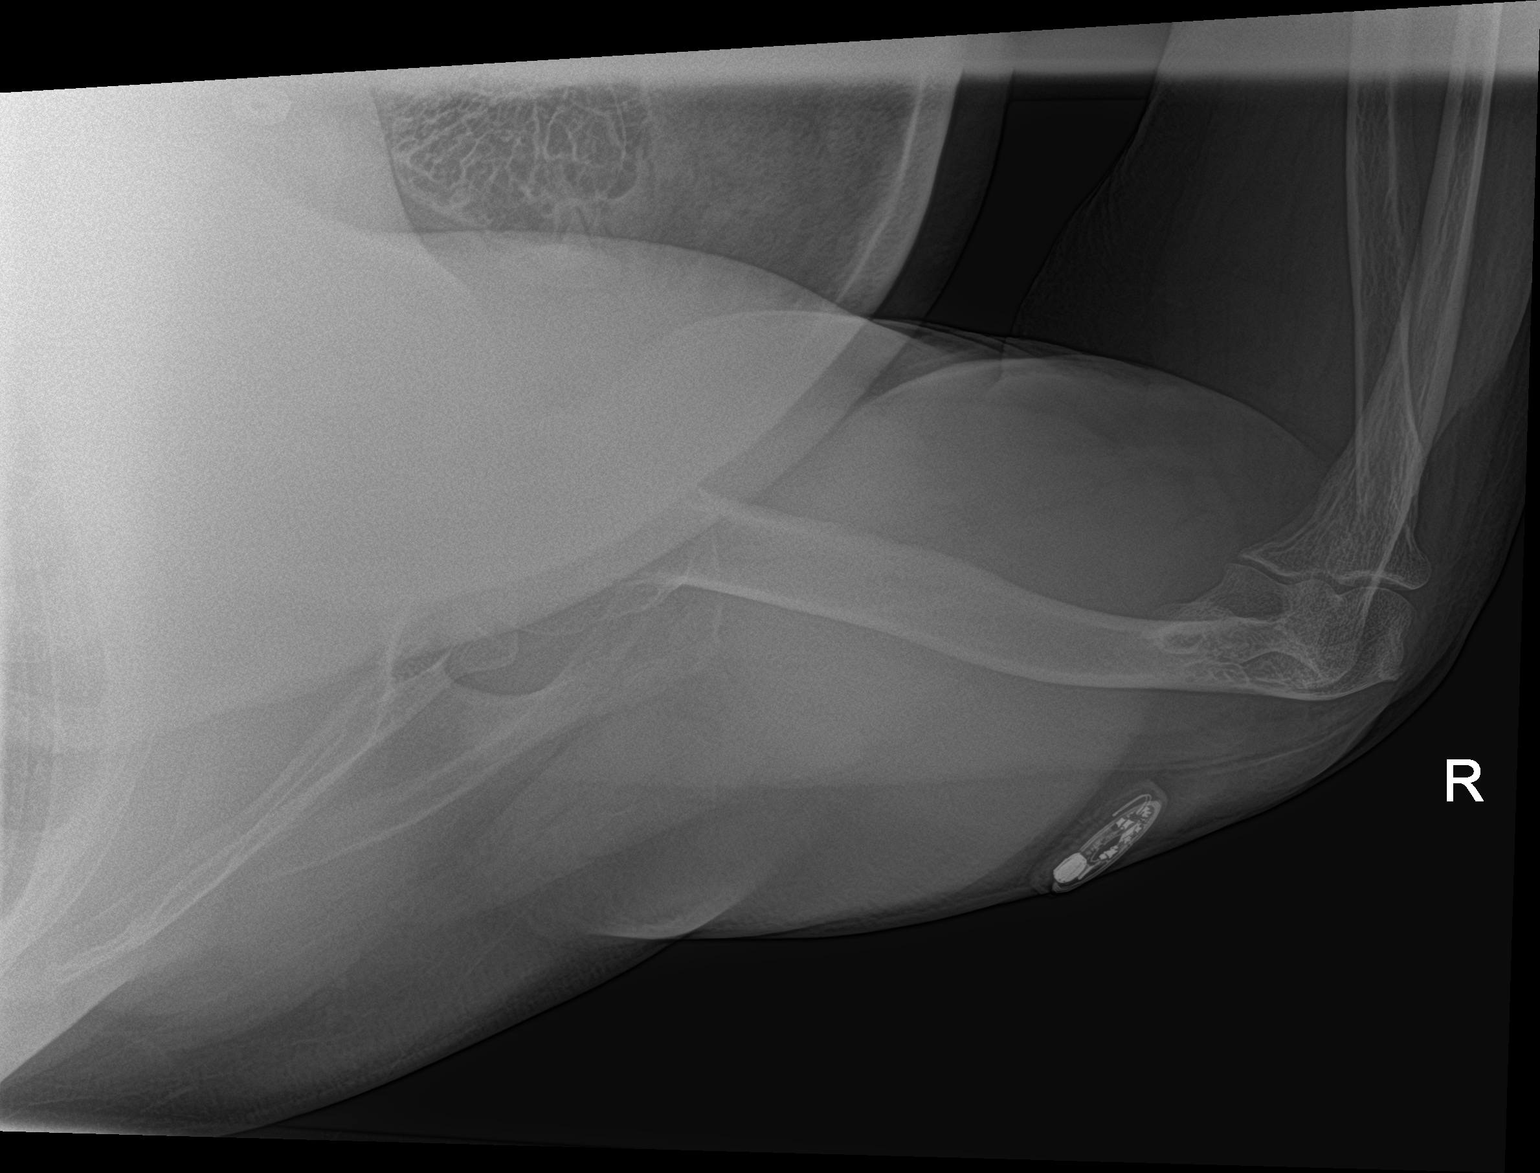

[3 of 3 positions shown; findings below may reference images not displayed]

FINDINGS: There is no evidence of fracture or dislocation. There is no
evidence of arthropathy or other focal bone abnormality. Soft
tissues are unremarkable.
IMPRESSION: Negative.

## 2021-01-23 DIAGNOSIS — L4050 Arthropathic psoriasis, unspecified: Secondary | ICD-10-CM | POA: Diagnosis not present

## 2021-02-12 ENCOUNTER — Ambulatory Visit (INDEPENDENT_AMBULATORY_CARE_PROVIDER_SITE_OTHER): Payer: Medicare Other | Admitting: Ophthalmology

## 2021-02-12 ENCOUNTER — Other Ambulatory Visit: Payer: Self-pay

## 2021-02-12 ENCOUNTER — Encounter (INDEPENDENT_AMBULATORY_CARE_PROVIDER_SITE_OTHER): Payer: Self-pay | Admitting: Ophthalmology

## 2021-02-12 DIAGNOSIS — E113412 Type 2 diabetes mellitus with severe nonproliferative diabetic retinopathy with macular edema, left eye: Secondary | ICD-10-CM | POA: Diagnosis not present

## 2021-02-12 DIAGNOSIS — E113411 Type 2 diabetes mellitus with severe nonproliferative diabetic retinopathy with macular edema, right eye: Secondary | ICD-10-CM | POA: Diagnosis not present

## 2021-02-12 MED ORDER — AFLIBERCEPT 2MG/0.05ML IZ SOLN FOR KALEIDOSCOPE
2.0000 mg | INTRAVITREAL | Status: AC | PRN
  Administered 2021-02-12: 11:00:00 2 mg via INTRAVITREAL

## 2021-02-12 NOTE — Assessment & Plan Note (Signed)
Overall vastly improved less center involved CSME yet still residual thickening superonasal.  We recently resolved in this region.  Repeat injection Eylea OS today and will likely need focal laser treatment left eye in the coming 2 to 3 weeks to keep this up maintain this area from active disease

## 2021-02-12 NOTE — Progress Notes (Signed)
02/12/2021     CHIEF COMPLAINT Patient presents for  Chief Complaint  Patient presents with   Diabetic Retinopathy with Macular Edema      HISTORY OF PRESENT ILLNESS: Shannon Craig is a 70 y.o. female who presents to the clinic today for:   HPI   OS follow-up today after 8 weeks of injection OS and history of injection Eylea ,,  OD most recent injection 4 months ago antivegF, Eylea Last edited by Hurman Horn, MD on 02/12/2021 10:40 AM.      Referring physician: Merrilee Seashore, MD [address] [place],  [place] [address]  HISTORICAL INFORMATION:   Selected notes from the [facility]: No current outpatient medications on file. (Ophthalmic Drugs)   No current facility-administered medications for this visit. (Ophthalmic Drugs)   Current Outpatient Medications (Other)  Medication Sig   Albiglutide (TANZEUM) 50 MG PEN Inject into the skin.   Alogliptin Benzoate (NESINA) 25 MG TABS Take by mouth.   atorvastatin (LIPITOR) 40 MG tablet Take 40 mg by mouth daily.   Calcium-Vitamin D-Vitamin K (VIACTIV PO) Take 1 tablet by mouth daily.   Canagliflozin (INVOKANA) 300 MG TABS Take 300 mg by mouth daily.   Certolizumab Pegol (CIMZIA) 2 X 200 MG KIT Inject 1 mL into the skin.   diltiazem (CARDIZEM) 120 MG tablet Take by mouth.   empagliflozin (JARDIANCE) 25 MG TABS tablet Take 25 mg by mouth daily.   HYDROcodone-acetaminophen (NORCO/VICODIN) 5-325 MG tablet Take 1 tablet by mouth every 6 (six) hours as needed.   InFLIXimab (REMICADE IV) Inject into the vein.   insulin aspart protamine - aspart (NOVOLOG MIX 70/30 FLEXPEN) (70-30) 100 UNIT/ML FlexPen inject 25 units before breakfast and 10 units before dinner subcutaneously   meloxicam (MOBIC) 15 MG tablet    Molnupiravir 200 MG CAPS Take 4 capules by mouth every 12 hours   Multiple Vitamin (MULTIVITAMIN) tablet Take 1 tablet by mouth daily.   pioglitazone (ACTOS) 15 MG  tablet Take 15 mg by mouth daily.   ramipril (ALTACE) 5 MG capsule Take 5 mg by mouth daily.   traMADol (ULTRAM) 50 MG tablet Take 1 tablet (50 mg total) by mouth every 6 (six) hours as needed. (Patient not taking: Reported on 06/02/2019)   No current facility-administered medications for this visit. (Other)      REVIEW OF SYSTEMS:    ALLERGIES Allergies  Allergen Reactions   Darvocet [Propoxyphene N-Acetaminophen]    Gadolinium Derivatives Hives   Methotrexate Derivatives    Percodan [Oxycodone-Aspirin]     PAST MEDICAL HISTORY Past Medical History:  Diagnosis Date   Diabetes mellitus ([place])    Diverticula of colon    External hemorrhoid    Hypertension    Internal hemorrhoid    Kidney stones    Liver nodule    Nodule of kidney    Past Surgical History:  Procedure Laterality Date   [place]   HAND SURGERY  2001   left, pins   LASIK  2005   bilateral   LITHOTRIPSY  1983    FAMILY HISTORY Family History  Problem Relation Age of Onset   Breast cancer Mother    Diabetes Father    Prostate cancer Maternal Uncle    Stroke Maternal Grandmother    Heart attack Maternal Grandfather    Diabetes Brother     SOCIAL HISTORY  Social History   Tobacco Use   Smoking status: Never   Smokeless tobacco: Never  Substance Use Topics   Alcohol use: No   Drug use: No         OPHTHALMIC EXAM:  Base Eye Exam     Visual Acuity (ETDRS)       Right Left   Dist [place] 20/25 +2 20/25 +2         Tonometry (Tonopen, 10:43 AM)       Right Left   Pressure 14 12         Pupils       Pupils APD   Right PERRL None   Left PERRL None         Extraocular Movement       Right Left    Full, Ortho Full, Ortho         Neuro/Psych     Oriented x3: Yes   Mood/Affect: Normal         Dilation     Both eyes: 1.0% Mydriacyl, 2.5% Phenylephrine @ 10:43 AM           Slit Lamp and Fundus Exam      External Exam       Right Left   External Normal Normal         Slit Lamp Exam       Right Left   Lids/Lashes Normal Normal   Conjunctiva/Sclera White and quiet White and quiet   Cornea Clear Clear   Anterior Chamber Deep and quiet Deep and quiet   Iris Round and reactive Round and reactive   Lens Posterior chamber intraocular lens, Open posterior capsule Posterior chamber intraocular lens, Open posterior capsule   Anterior Vitreous Normal Normal         Fundus Exam       Right Left   Posterior Vitreous Posterior vitreous detachment Posterior vitreous detachment   Disc Normal Normal   C/D Ratio 0.7 0.5   Macula Microaneurysms, Hard drusen, Early, Exudates focal clinically significant macular edema, superior to the FAZ region, exudates, Microaneurysms, Mild clinically significant macular edema, Macular thickening superonasal   Vessels NPDR-Severe Retinopathy, severe nonproliferative diabetic retinopathy with limited peripheral PRP room temporally and superiorly for more laser   Periphery Peripheral retinal nonperfusion, good PRP superiorly and nasally,and inferiorly Peripheral retinal nonperfusion nasal and inferior            IMAGING AND PROCEDURES  Imaging and Procedures for 02/12/21  OCT, Retina - OU - Both Eyes       Right Eye Quality was good. Scan locations included subfoveal. Central Foveal Thickness: 233.   Left Eye Quality was good. Scan locations included subfoveal. Central Foveal Thickness: 234. Findings include abnormal foveal contour, epiretinal membrane.   Notes OS minor increased retinal thickening mostly now superior and superonasal to FAZ.  Overall center involved CSME vastly improved as compared to last examination and injection OS   OD region superior to the fovea vastly improved and at 4 months post intravitreal Eylea Will repeat evaluation OD and is stable post focal August 2022     Intravitreal Injection, Pharmacologic Agent - OS - Left  Eye       Time Out 02/12/2021. 11:11 AM. Confirmed correct patient, procedure, site, and patient consented.   Anesthesia Topical anesthesia was used. Anesthetic medications included Lidocaine 4%.   Procedure Preparation included 5% betadine to ocular surface, 10% betadine to eyelids, Tobramycin 0.3%. A 30 gauge needle was used.  Injection: 2 mg aflibercept 2 MG/0.05ML   Route: Intravitreal, Site: Left Eye   NDC: [id number], Lot: [id number], Waste: 0 mL   Post-op Post injection exam found visual acuity of at least counting fingers. The patient tolerated the procedure well. There were no complications. The patient received written and verbal post procedure care education. Post injection medications included ocuflox.              ASSESSMENT/PLAN:  Severe nonproliferative diabetic retinopathy of left eye, with macular edema, associated with type 2 diabetes mellitus ([place]) Overall vastly improved less center involved CSME yet still residual thickening superonasal.  We recently resolved in this region.  Repeat injection Eylea OS today and will likely need focal laser treatment left eye in the coming 2 to 3 weeks to keep this up maintain this area from active disease  Severe nonproliferative diabetic retinopathy of right eye, with macular edema, associated with type 2 diabetes mellitus ([place]) Now 4 months post most recent injection Eylea, and stabilized some 4 months also post focal laser treatment with no recurrence will observe     ICD-10-CM   1. Severe nonproliferative diabetic retinopathy of left eye, with macular edema, associated with type 2 diabetes mellitus (HCC)  V67.2094 OCT, Retina - OU - Both Eyes    Intravitreal Injection, Pharmacologic Agent - OS - Left Eye    aflibercept (EYLEA) SOLN 2 mg    2. Severe nonproliferative diabetic retinopathy of right eye, with macular edema, associated with type 2 diabetes mellitus (HCC)  E11.3411 OCT, Retina - OU - Both Eyes      1.   OD stable, severe NPDR with good moderate scatter PRP stabilizing peripheral retinal perfusion status , no recurrence of CSME  2.  OS, with minor recurrence of new onset involved CSME will need repeat Eylea injection today followed thereafter by focal laser treatment superonasal to minimize risk of recurrence in this noncenter involved CSME  3.  Ophthalmic Meds Ordered this visit:  Meds ordered this encounter  Medications   aflibercept (EYLEA) SOLN 2 mg       Return in about 2 weeks (around 02/26/2021) for dilate, OS, FOCAL.  There are no Patient Instructions on file for this visit.   Explained the diagnoses, plan, and follow up with the patient and they expressed understanding.  Patient expressed understanding of the importance of proper follow up care.   Clent Demark Adriana Quinby M.D. Diseases & Surgery of the Retina and Vitreous Retina & Diabetic [place] 02/12/21     Abbreviations: M myopia (nearsighted); A astigmatism; H hyperopia (farsighted); P presbyopia; Mrx spectacle prescription;  CTL contact lenses; OD right eye; OS left eye; OU both eyes  XT exotropia; ET esotropia; PEK punctate epithelial keratitis; PEE punctate epithelial erosions; DES dry eye syndrome; MGD meibomian gland dysfunction; ATs artificial tears; PFAT's preservative free artificial tears; [place] nuclear sclerotic cataract; PSC posterior subcapsular cataract; ERM epi-retinal membrane; PVD posterior vitreous detachment; RD retinal detachment; DM diabetes mellitus; DR diabetic retinopathy; NPDR non-proliferative diabetic retinopathy; PDR proliferative diabetic retinopathy; CSME clinically significant macular edema; DME diabetic macular edema; dbh dot blot hemorrhages; CWS cotton wool spot; POAG primary open angle glaucoma; C/D cup-to-disc ratio; HVF humphrey visual field; GVF goldmann visual field; OCT optical coherence tomography; IOP intraocular pressure; BRVO Branch retinal vein occlusion; CRVO central retinal vein  occlusion; CRAO central retinal artery occlusion; BRAO branch retinal artery occlusion; RT retinal tear; SB scleral buckle; PPV pars plana vitrectomy; VH Vitreous hemorrhage; PRP panretinal laser photocoagulation; IVK  intravitreal kenalog; VMT vitreomacular traction; MH Macular hole;  NVD neovascularization of the disc; NVE neovascularization elsewhere; AREDS age related eye disease study; ARMD age related macular degeneration; POAG primary open angle glaucoma; EBMD epithelial/anterior basement membrane dystrophy; ACIOL anterior chamber intraocular lens; IOL intraocular lens; PCIOL posterior chamber intraocular lens; Phaco/IOL phacoemulsification with intraocular lens placement; [place] photorefractive keratectomy; LASIK laser assisted in situ keratomileusis; HTN hypertension; DM diabetes mellitus; COPD chronic obstructive pulmonary disease

## 2021-02-12 NOTE — Assessment & Plan Note (Signed)
Now 4 months post most recent injection Eylea, and stabilized some 4 months also post focal laser treatment with no recurrence will observe

## 2021-02-21 DIAGNOSIS — L4050 Arthropathic psoriasis, unspecified: Secondary | ICD-10-CM | POA: Diagnosis not present

## 2021-02-27 ENCOUNTER — Encounter (INDEPENDENT_AMBULATORY_CARE_PROVIDER_SITE_OTHER): Payer: Medicare Other | Admitting: Ophthalmology

## 2021-03-05 ENCOUNTER — Ambulatory Visit (INDEPENDENT_AMBULATORY_CARE_PROVIDER_SITE_OTHER): Payer: Medicare Other | Admitting: Ophthalmology

## 2021-03-05 ENCOUNTER — Encounter (INDEPENDENT_AMBULATORY_CARE_PROVIDER_SITE_OTHER): Payer: Self-pay | Admitting: Ophthalmology

## 2021-03-05 ENCOUNTER — Other Ambulatory Visit: Payer: Self-pay

## 2021-03-05 DIAGNOSIS — E113411 Type 2 diabetes mellitus with severe nonproliferative diabetic retinopathy with macular edema, right eye: Secondary | ICD-10-CM | POA: Diagnosis not present

## 2021-03-05 DIAGNOSIS — E113412 Type 2 diabetes mellitus with severe nonproliferative diabetic retinopathy with macular edema, left eye: Secondary | ICD-10-CM

## 2021-03-05 NOTE — Progress Notes (Signed)
03/05/2021     CHIEF COMPLAINT Patient presents for  Chief Complaint  Patient presents with   Retina Follow Up    FOr diabetic CSME OS, 1 center involved, superonasal to [place].,  Potential focal laser treatment OS HISTORY OF PRESENT ILLNESS: Shannon Craig is a 71 y.o. female who presents to the clinic today for:   HPI     Retina Follow Up           Diagnosis: Diabetic Retinopathy   Laterality: left eye   Onset: 3 weeks ago   Severity: mild   Duration: 3 weeks         Comments   3 weeks for dilate OS, focal OS. Patient states vision is stable and unchanged since last visit. Denies any new floaters or FOL.       Last edited by Laurin Coder on 03/05/2021  8:52 AM.      Referring physician: Merrilee Seashore, MD [address] [place],  [place] [address]  HISTORICAL INFORMATION:   Selected notes from the MEDICAL RECORD NUMBER       CURRENT MEDICATIONS: No current outpatient medications on file. (Ophthalmic Drugs)   No current facility-administered medications for this visit. (Ophthalmic Drugs)   Current Outpatient Medications (Other)  Medication Sig   Albiglutide (TANZEUM) 50 MG PEN Inject into the skin.   Alogliptin Benzoate (NESINA) 25 MG TABS Take by mouth.   atorvastatin (LIPITOR) 40 MG tablet Take 40 mg by mouth daily.   Calcium-Vitamin D-Vitamin K (VIACTIV PO) Take 1 tablet by mouth daily.   Canagliflozin (INVOKANA) 300 MG TABS Take 300 mg by mouth daily.   Certolizumab Pegol (CIMZIA) 2 X 200 MG KIT Inject 1 mL into the skin.   diltiazem (CARDIZEM) 120 MG tablet Take by mouth.   empagliflozin (JARDIANCE) 25 MG TABS tablet Take 25 mg by mouth daily.   HYDROcodone-acetaminophen (NORCO/VICODIN) 5-325 MG tablet Take 1 tablet by mouth every 6 (six) hours as needed.   InFLIXimab (REMICADE IV) Inject into the vein.   insulin aspart protamine - aspart (NOVOLOG MIX 70/30 FLEXPEN) (70-30) 100 UNIT/ML FlexPen inject 25 units before breakfast  and 10 units before dinner subcutaneously   meloxicam (MOBIC) 15 MG tablet    Molnupiravir 200 MG CAPS Take 4 capules by mouth every 12 hours   Multiple Vitamin (MULTIVITAMIN) tablet Take 1 tablet by mouth daily.   pioglitazone (ACTOS) 15 MG tablet Take 15 mg by mouth daily.   ramipril (ALTACE) 5 MG capsule Take 5 mg by mouth daily.   traMADol (ULTRAM) 50 MG tablet Take 1 tablet (50 mg total) by mouth every 6 (six) hours as needed. (Patient not taking: Reported on 06/02/2019)   No current facility-administered medications for this visit. (Other)      REVIEW OF SYSTEMS: ROS   Negative for: Constitutional, Gastrointestinal, Neurological, Skin, Genitourinary, Musculoskeletal, HENT, Endocrine, Cardiovascular, Eyes, Respiratory, Psychiatric, Allergic/Imm, Heme/Lymph Last edited by Hurman Horn, MD on 03/05/2021  9:30 AM.       ALLERGIES Allergies  Allergen Reactions   Darvocet [Propoxyphene N-Acetaminophen]    Gadolinium Derivatives Hives   Methotrexate Derivatives    Percodan [Oxycodone-Aspirin]     PAST MEDICAL HISTORY Past Medical History:  Diagnosis Date   Diabetes mellitus ([place])    Diverticula of colon    External hemorrhoid    Hypertension    Internal hemorrhoid    Kidney stones    Liver nodule    Nodule of kidney  Past Surgical History:  Procedure Laterality Date   ABDOMINAL HYSTERECTOMY  1991   BREAST REDUCTION SURGERY  1985   HAND SURGERY  2001   left, pins   LASIK  2005   bilateral   LITHOTRIPSY  1983    FAMILY HISTORY Family History  Problem Relation Age of Onset   Breast cancer Mother    Diabetes Father    Prostate cancer Maternal Uncle    Stroke Maternal Grandmother    Heart attack Maternal Grandfather    Diabetes Brother     SOCIAL HISTORY Social History   Tobacco Use   Smoking status: Never   Smokeless tobacco: Never  Substance Use Topics   Alcohol use: No   Drug use: No         OPHTHALMIC EXAM:  Base Eye Exam     Visual  Acuity (ETDRS)       Right Left   Dist [place] 20/25 20/25 -2         Tonometry (Tonopen, 8:49 AM)       Right Left   Pressure 16 13         Pupils       Pupils Dark Light APD   Right PERRL 3 2.5 None   Left PERRL 3 2.5 None         Visual Fields (Counting fingers)       Left Right    Full Full         Extraocular Movement       Right Left    Full Full         Neuro/Psych     Oriented x3: Yes   Mood/Affect: Normal         Dilation     Left eye: 1.0% Mydriacyl, 2.5% Phenylephrine @ 8:49 AM           Slit Lamp and Fundus Exam     External Exam       Right Left   External Normal Normal         Slit Lamp Exam       Right Left   Lids/Lashes Normal Normal   Conjunctiva/Sclera White and quiet White and quiet   Cornea Clear Clear   Anterior Chamber Deep and quiet Deep and quiet   Iris Round and reactive Round and reactive   Lens Posterior chamber intraocular lens, Open posterior capsule Posterior chamber intraocular lens, Open posterior capsule   Anterior Vitreous Normal Normal         Fundus Exam       Right Left   Posterior Vitreous  Posterior vitreous detachment   Disc  Normal   C/D Ratio  0.5   Macula  focal clinically significant macular edema, superior to the FAZ region, exudates, Microaneurysms, Mild clinically significant macular edema, Macular thickening superonasal   Vessels  Retinopathy, severe nonproliferative diabetic retinopathy with limited peripheral PRP room temporally and superiorly for more laser   Periphery  Peripheral retinal nonperfusion nasal and inferior            IMAGING AND PROCEDURES  Imaging and Procedures for 03/05/21  Focal Laser - OS - Left Eye       Time Out Confirmed correct patient, procedure, site, and patient consented.   Anesthesia Topical anesthesia was used. Anesthetic medications included Proparacaine 0.5%.   Laser Information The type of laser was diode. Color was yellow. The  duration in seconds was 0.1. The spot size was 100 microns. Laser  power was 90. Total spots was 124.   Post-op The patient tolerated the procedure well. There were no complications. The patient received written and verbal post procedure care education.   Notes Focal laser photocoagulation delivered today in a grid like fashion superonasal to the fovea.             ASSESSMENT/PLAN:  Severe nonproliferative diabetic retinopathy of left eye, with macular edema, associated with type 2 diabetes mellitus (HCC) Nontender involved CSME persists, focal laser treatment gridlike pattern needs to be delivered today OS  Severe nonproliferative diabetic retinopathy of right eye, with macular edema, associated with type 2 diabetes mellitus ([place]) OD still stable, not center involved CSME mostly resolved.  Previous focal laser photocoagulation also delivering 5 months.      ICD-10-CM   1. Severe nonproliferative diabetic retinopathy of left eye, with macular edema, associated with type 2 diabetes mellitus (HCC)  R51.8841 Focal Laser - OS - Left Eye    2. Severe nonproliferative diabetic retinopathy of right eye, with macular edema, associated with type 2 diabetes mellitus (HCC)  E11.3411       1.  OS, nontender involved CSME still active, focal laser treatment delivered today in a grid like pattern to this region superonasal to FAZ  2.  OD still with resolved CSME post AntivegF therapy as well as focal laser treatment some 5 months previous  3.  Ophthalmic Meds Ordered this visit:  No orders of the defined types were placed in this encounter.      Return in about 4 months (around 07/03/2021) for DILATE OU, COLOR FP, OCT.  There are no Patient Instructions on file for this visit.   Explained the diagnoses, plan, and follow up with the patient and they expressed understanding.  Patient expressed understanding of the importance of proper follow up care.   Clent Demark Hideo Googe M.D. Diseases &  Surgery of the Retina and Vitreous Retina & Diabetic [place] 03/05/21     Abbreviations: M myopia (nearsighted); A astigmatism; H hyperopia (farsighted); P presbyopia; Mrx spectacle prescription;  CTL contact lenses; OD right eye; OS left eye; OU both eyes  XT exotropia; ET esotropia; PEK punctate epithelial keratitis; PEE punctate epithelial erosions; DES dry eye syndrome; MGD meibomian gland dysfunction; ATs artificial tears; PFAT's preservative free artificial tears; [place] nuclear sclerotic cataract; PSC posterior subcapsular cataract; ERM epi-retinal membrane; PVD posterior vitreous detachment; RD retinal detachment; DM diabetes mellitus; DR diabetic retinopathy; NPDR non-proliferative diabetic retinopathy; PDR proliferative diabetic retinopathy; CSME clinically significant macular edema; DME diabetic macular edema; dbh dot blot hemorrhages; CWS cotton wool spot; POAG primary open angle glaucoma; C/D cup-to-disc ratio; HVF humphrey visual field; GVF goldmann visual field; OCT optical coherence tomography; IOP intraocular pressure; BRVO Branch retinal vein occlusion; CRVO central retinal vein occlusion; CRAO central retinal artery occlusion; BRAO branch retinal artery occlusion; RT retinal tear; SB scleral buckle; PPV pars plana vitrectomy; VH Vitreous hemorrhage; PRP panretinal laser photocoagulation; IVK intravitreal kenalog; VMT vitreomacular traction; MH Macular hole;  NVD neovascularization of the disc; NVE neovascularization elsewhere; AREDS age related eye disease study; ARMD age related macular degeneration; POAG primary open angle glaucoma; EBMD epithelial/anterior basement membrane dystrophy; ACIOL anterior chamber intraocular lens; IOL intraocular lens; PCIOL posterior chamber intraocular lens; Phaco/IOL phacoemulsification with intraocular lens placement; [place] photorefractive keratectomy; LASIK laser assisted in situ keratomileusis; HTN hypertension; DM diabetes mellitus; COPD chronic  obstructive pulmonary disease

## 2021-03-05 NOTE — Assessment & Plan Note (Signed)
Nontender involved CSME persists, focal laser treatment gridlike pattern needs to be delivered today OS

## 2021-03-05 NOTE — Assessment & Plan Note (Signed)
OD still stable, not center involved CSME mostly resolved.  Previous focal laser photocoagulation also delivering 5 months.

## 2021-03-07 DIAGNOSIS — Z9641 Presence of insulin pump (external) (internal): Secondary | ICD-10-CM | POA: Diagnosis not present

## 2021-03-07 DIAGNOSIS — E1169 Type 2 diabetes mellitus with other specified complication: Secondary | ICD-10-CM | POA: Diagnosis not present

## 2021-03-07 DIAGNOSIS — E118 Type 2 diabetes mellitus with unspecified complications: Secondary | ICD-10-CM | POA: Diagnosis not present

## 2021-03-21 DIAGNOSIS — M810 Age-related osteoporosis without current pathological fracture: Secondary | ICD-10-CM | POA: Diagnosis not present

## 2021-03-25 DIAGNOSIS — L4050 Arthropathic psoriasis, unspecified: Secondary | ICD-10-CM | POA: Diagnosis not present

## 2021-03-29 DIAGNOSIS — Z20822 Contact with and (suspected) exposure to covid-19: Secondary | ICD-10-CM | POA: Diagnosis not present

## 2021-04-11 DIAGNOSIS — M549 Dorsalgia, unspecified: Secondary | ICD-10-CM | POA: Diagnosis not present

## 2021-04-11 DIAGNOSIS — M542 Cervicalgia: Secondary | ICD-10-CM | POA: Diagnosis not present

## 2021-04-11 DIAGNOSIS — M810 Age-related osteoporosis without current pathological fracture: Secondary | ICD-10-CM | POA: Diagnosis not present

## 2021-04-11 DIAGNOSIS — Z79899 Other long term (current) drug therapy: Secondary | ICD-10-CM | POA: Diagnosis not present

## 2021-04-11 DIAGNOSIS — M545 Low back pain, unspecified: Secondary | ICD-10-CM | POA: Diagnosis not present

## 2021-04-11 DIAGNOSIS — M150 Primary generalized (osteo)arthritis: Secondary | ICD-10-CM | POA: Diagnosis not present

## 2021-04-11 DIAGNOSIS — L4050 Arthropathic psoriasis, unspecified: Secondary | ICD-10-CM | POA: Diagnosis not present

## 2021-04-11 DIAGNOSIS — D696 Thrombocytopenia, unspecified: Secondary | ICD-10-CM | POA: Diagnosis not present

## 2021-04-23 DIAGNOSIS — L4050 Arthropathic psoriasis, unspecified: Secondary | ICD-10-CM | POA: Diagnosis not present

## 2021-05-07 DIAGNOSIS — Z9641 Presence of insulin pump (external) (internal): Secondary | ICD-10-CM | POA: Diagnosis not present

## 2021-05-07 DIAGNOSIS — E118 Type 2 diabetes mellitus with unspecified complications: Secondary | ICD-10-CM | POA: Diagnosis not present

## 2021-05-22 DIAGNOSIS — Z1159 Encounter for screening for other viral diseases: Secondary | ICD-10-CM | POA: Diagnosis not present

## 2021-05-22 DIAGNOSIS — L4050 Arthropathic psoriasis, unspecified: Secondary | ICD-10-CM | POA: Diagnosis not present

## 2021-05-31 DIAGNOSIS — Z1152 Encounter for screening for COVID-19: Secondary | ICD-10-CM | POA: Diagnosis not present

## 2021-05-31 DIAGNOSIS — Z20828 Contact with and (suspected) exposure to other viral communicable diseases: Secondary | ICD-10-CM | POA: Diagnosis not present

## 2021-06-05 DIAGNOSIS — I700 Atherosclerosis of aorta: Secondary | ICD-10-CM | POA: Diagnosis not present

## 2021-06-05 DIAGNOSIS — Z794 Long term (current) use of insulin: Secondary | ICD-10-CM | POA: Diagnosis not present

## 2021-06-05 DIAGNOSIS — L4050 Arthropathic psoriasis, unspecified: Secondary | ICD-10-CM | POA: Diagnosis not present

## 2021-06-05 DIAGNOSIS — I10 Essential (primary) hypertension: Secondary | ICD-10-CM | POA: Diagnosis not present

## 2021-06-05 DIAGNOSIS — E113413 Type 2 diabetes mellitus with severe nonproliferative diabetic retinopathy with macular edema, bilateral: Secondary | ICD-10-CM | POA: Diagnosis not present

## 2021-06-05 DIAGNOSIS — E782 Mixed hyperlipidemia: Secondary | ICD-10-CM | POA: Diagnosis not present

## 2021-06-05 DIAGNOSIS — E1140 Type 2 diabetes mellitus with diabetic neuropathy, unspecified: Secondary | ICD-10-CM | POA: Diagnosis not present

## 2021-06-05 DIAGNOSIS — M810 Age-related osteoporosis without current pathological fracture: Secondary | ICD-10-CM | POA: Diagnosis not present

## 2021-06-10 DIAGNOSIS — Z20822 Contact with and (suspected) exposure to covid-19: Secondary | ICD-10-CM | POA: Diagnosis not present

## 2021-06-12 DIAGNOSIS — L4050 Arthropathic psoriasis, unspecified: Secondary | ICD-10-CM | POA: Diagnosis not present

## 2021-06-12 DIAGNOSIS — E113413 Type 2 diabetes mellitus with severe nonproliferative diabetic retinopathy with macular edema, bilateral: Secondary | ICD-10-CM | POA: Diagnosis not present

## 2021-06-12 DIAGNOSIS — E782 Mixed hyperlipidemia: Secondary | ICD-10-CM | POA: Diagnosis not present

## 2021-06-12 DIAGNOSIS — N182 Chronic kidney disease, stage 2 (mild): Secondary | ICD-10-CM | POA: Diagnosis not present

## 2021-06-12 DIAGNOSIS — I700 Atherosclerosis of aorta: Secondary | ICD-10-CM | POA: Diagnosis not present

## 2021-06-12 DIAGNOSIS — I10 Essential (primary) hypertension: Secondary | ICD-10-CM | POA: Diagnosis not present

## 2021-06-12 DIAGNOSIS — Z794 Long term (current) use of insulin: Secondary | ICD-10-CM | POA: Diagnosis not present

## 2021-06-12 DIAGNOSIS — E1140 Type 2 diabetes mellitus with diabetic neuropathy, unspecified: Secondary | ICD-10-CM | POA: Diagnosis not present

## 2021-06-12 DIAGNOSIS — M810 Age-related osteoporosis without current pathological fracture: Secondary | ICD-10-CM | POA: Diagnosis not present

## 2021-06-19 DIAGNOSIS — L4050 Arthropathic psoriasis, unspecified: Secondary | ICD-10-CM | POA: Diagnosis not present

## 2021-07-03 ENCOUNTER — Encounter (INDEPENDENT_AMBULATORY_CARE_PROVIDER_SITE_OTHER): Payer: Self-pay | Admitting: Ophthalmology

## 2021-07-03 ENCOUNTER — Ambulatory Visit (INDEPENDENT_AMBULATORY_CARE_PROVIDER_SITE_OTHER): Payer: Medicare Other | Admitting: Ophthalmology

## 2021-07-03 DIAGNOSIS — E113412 Type 2 diabetes mellitus with severe nonproliferative diabetic retinopathy with macular edema, left eye: Secondary | ICD-10-CM

## 2021-07-03 DIAGNOSIS — E113411 Type 2 diabetes mellitus with severe nonproliferative diabetic retinopathy with macular edema, right eye: Secondary | ICD-10-CM

## 2021-07-03 NOTE — Progress Notes (Signed)
? ? ?07/03/2021 ? ?  ? ?CHIEF COMPLAINT ?Patient presents for  ?Chief Complaint  ?Patient presents with  ? Diabetic Retinopathy with Macular Edema  ? ? ? ? ?HISTORY OF PRESENT ILLNESS: ?Shannon Craig is a 71 y.o. female who presents to the clinic today for:  ? ?HPI   ?4 mos for DILATE OU, COLOR FP, OCT. ?Pt stated vision has been stable however it is still foggy. ?Pt denies floaters and FOL. ?Pt reports seeing "little white circles" rotating in both eyes.  ? ? ?Last edited by Silvestre Moment on 07/03/2021  9:53 AM.  ?  ? ? ?Referring physician: ?Merrilee Seashore, MD ?[place],  [place] [address] ? ?HISTORICAL INFORMATION:  ? ?Selected notes from the [facility] ?  ?   ? ?CURRENT MEDICATIONS: ?No current outpatient medications on file. (Ophthalmic Drugs)  ? ?No current facility-administered medications for this visit. (Ophthalmic Drugs)  ? ?Current Outpatient Medications (Other)  ?Medication Sig  ? Albiglutide (TANZEUM) 50 MG PEN Inject into the skin.  ? Alogliptin Benzoate (NESINA) 25 MG TABS Take by mouth.  ? atorvastatin (LIPITOR) 40 MG tablet Take 40 mg by mouth daily.  ? Calcium-Vitamin D-Vitamin K (VIACTIV PO) Take 1 tablet by mouth daily.  ? Canagliflozin (INVOKANA) 300 MG TABS Take 300 mg by mouth daily.  ? Certolizumab Pegol (CIMZIA) 2 X 200 MG KIT Inject 1 mL into the skin.  ? diltiazem (CARDIZEM) 120 MG tablet Take by mouth.  ? empagliflozin (JARDIANCE) 25 MG TABS tablet Take 25 mg by mouth daily.  ? HYDROcodone-acetaminophen (NORCO/VICODIN) 5-325 MG tablet Take 1 tablet by mouth every 6 (six) hours as needed.  ? InFLIXimab (REMICADE IV) Inject into the vein.  ? insulin aspart protamine - aspart (NOVOLOG MIX 70/30 FLEXPEN) (70-30) 100 UNIT/ML FlexPen inject 25 units before breakfast and 10 units before dinner subcutaneously  ? meloxicam (MOBIC) 15 MG tablet   ? Molnupiravir 200 MG CAPS Take 4 capules by mouth every 12 hours  ? Multiple Vitamin (MULTIVITAMIN) tablet Take 1 tablet by  mouth daily.  ? pioglitazone (ACTOS) 15 MG tablet Take 15 mg by mouth daily.  ? ramipril (ALTACE) 5 MG capsule Take 5 mg by mouth daily.  ? traMADol (ULTRAM) 50 MG tablet Take 1 tablet (50 mg total) by mouth every 6 (six) hours as needed. (Patient not taking: Reported on 06/02/2019)  ? ?No current facility-administered medications for this visit. (Other)  ? ? ? ? ?REVIEW OF SYSTEMS: ?ROS   ?Negative for: Constitutional, Gastrointestinal, Neurological, Skin, Genitourinary, Musculoskeletal, HENT, Endocrine, Cardiovascular, Eyes, Respiratory, Psychiatric, Allergic/Imm, Heme/Lymph ?Last edited by Silvestre Moment on 07/03/2021  9:53 AM.  ?  ? ? ? ?ALLERGIES ?Allergies  ?Allergen Reactions  ? Darvocet [Propoxyphene N-Acetaminophen]   ? Gadolinium Derivatives Hives  ? Methotrexate Derivatives   ? Percodan [Oxycodone-Aspirin]   ? ? ?PAST MEDICAL HISTORY ?Past Medical History:  ?Diagnosis Date  ? Diabetes mellitus ([place])   ? Diverticula of colon   ? External hemorrhoid   ? Hypertension   ? Internal hemorrhoid   ? Kidney stones   ? Liver nodule   ? Nodule of kidney   ? ?Past Surgical History:  ?Procedure Laterality Date  ? ABDOMINAL HYSTERECTOMY  1991  ? BREAST REDUCTION SURGERY  1985  ? HAND SURGERY  2001  ? left, pins  ? LASIK  2005  ? bilateral  ? LITHOTRIPSY  1983  ? ? ?FAMILY HISTORY ?Family History  ?Problem Relation Age  of Onset  ? Breast cancer Mother   ? Diabetes Father   ? Prostate cancer Maternal Uncle   ? Stroke Maternal Grandmother   ? Heart attack Maternal Grandfather   ? Diabetes Brother   ? ? ?SOCIAL HISTORY ?Social History  ? ?Tobacco Use  ? Smoking status: Never  ? Smokeless tobacco: Never  ?Substance Use Topics  ? Alcohol use: No  ? Drug use: No  ? ?  ? ?  ? ?OPHTHALMIC EXAM: ? ?Base Eye Exam   ? ? Visual Acuity (ETDRS)   ? ?   Right Left  ? Dist [place] 20/25 20/70  ? Dist ph [place]  NI  ? ?  ?  ? ? Tonometry (Tonopen, 10:00 AM)   ? ?   Right Left  ? Pressure 12 11  ? ?  ?  ? ? Pupils   ? ?   Pupils APD  ? Right PERRL None  ?  Left PERRL None  ? ?  ?  ? ? Visual Fields   ? ?   Left Right  ?  Full Full  ? ?  ?  ? ? Extraocular Movement   ? ?   Right Left  ?  Full Full  ? ?  ?  ? ? Neuro/Psych   ? ? Oriented x3: Yes  ? Mood/Affect: Normal  ? ?  ?  ? ? Dilation   ? ? Both eyes: 1.0% Mydriacyl, 2.5% Phenylephrine @ 10:00 AM  ? ?  ?  ? ?  ? ?Slit Lamp and Fundus Exam   ? ? External Exam   ? ?   Right Left  ? External Normal Normal  ? ?  ?  ? ? Slit Lamp Exam   ? ?   Right Left  ? Lids/Lashes Normal Normal  ? Conjunctiva/Sclera White and quiet White and quiet  ? Cornea Clear Clear  ? Anterior Chamber Deep and quiet Deep and quiet  ? Iris Round and reactive Round and reactive  ? Lens Posterior chamber intraocular lens, Open posterior capsule Posterior chamber intraocular lens, Open posterior capsule  ? Anterior Vitreous Normal Normal  ? ?  ?  ? ? Fundus Exam   ? ?   Right Left  ? Posterior Vitreous Posterior vitreous detachment Posterior vitreous detachment  ? Disc Normal Normal  ? C/D Ratio 0.7 0.5  ? Macula Microaneurysms, Hard drusen, Early, Exudates, Severe clinically significant macular edema superior to the FAZ region, exudates, Microaneurysms, Macular thickening centrally, severe clinically significant macular edema  ? Vessels NPDR-Severe Retinopathy, severe nonproliferative diabetic retinopathy with limited peripheral PRP room temporally and superiorly for more laser  ? Periphery Peripheral retinal nonperfusion, good PRP superiorly and nasally,and inferiorly Peripheral retinal nonperfusion nasal and inferior  ? ?  ?  ? ?  ? ? ?IMAGING AND PROCEDURES  ?Imaging and Procedures for 07/03/21 ? ?OCT, Retina - OU - Both Eyes   ? ?   ?Right Eye ?Quality was good. Scan locations included subfoveal. Central Foveal Thickness: 403. Progression has worsened. Findings include abnormal foveal contour, cystoid macular edema.  ? ?Left Eye ?Quality was good. Scan locations included subfoveal. Central Foveal Thickness: 525. Progression has worsened.  Findings include abnormal foveal contour, cystoid macular edema, epiretinal membrane.  ? ?Notes ?OS minor increased retinal thickening mostly now superior and superonasal to FAZ.   ?l repeat evaluation OD and is stable post focal August 2022 ? ?  ? ?Color Fundus Photography Optos - OU -  Both Eyes   ? ?   ?Right Eye ?Progression has worsened. Disc findings include normal observations. Macula : microaneurysms, edema.  ? ?Left Eye ?Progression has worsened. Disc findings include normal observations. Macula : microaneurysms, edema.  ? ?Notes ?OD with severe nonproliferative diabetic retinopathy, status post moderate scatter panel photocoagulation and focal laser treatment.  CSME OD recurrent will need treatment ? ?OS with similar findings with severe NPDR status post focal laser treatment inferiorly.  CSME centrally worse ? ?  ? ? ?  ?  ? ?  ?ASSESSMENT/PLAN: ? ?Severe nonproliferative diabetic retinopathy of right eye, with macular edema, associated with type 2 diabetes mellitus ([place]) ? The nature of diabetic macular edema was discussed with the patient. Treatment options were outlined including medical therapy, laser & vitrectomy. The use of injectable medications reviewed, including Avastin, Lucentis, and Eylea. Periodic injections into the eye are likely to resolve diabetic macular edema (swelling in the center of vision). Initially, injections are delivered are delivered every 4-6 weeks, and the interval extended as the condition improves. On average, 8-9 injections the first year, and 5 in year 2. Improvement in the condition most often improves on medical therapy. Occasional use of focal laser is also recommended for residual macular edema (swelling). Excellent control of blood glucose and blood pressure are encouraged under the care of a primary physician or endocrinologist. Similarly, attempts to maintain serum cholesterol, low density lipoproteins, and high-density lipoproteins in a favorable range were  recommended.  ? ?Recurrence of CSME OD parafoveal, will need to retreat in 1 to 2 weeks ? ?Severe nonproliferative diabetic retinopathy of left eye, with macular edema, associated with type 2 diabetes mellitus ([facility]

## 2021-07-03 NOTE — Assessment & Plan Note (Signed)
Massive recurrence of center involved CSME at 4 months follow-up.  We will need to repeat injection Eylea OU as soon ?

## 2021-07-03 NOTE — Assessment & Plan Note (Signed)
The nature of diabetic macular edema was discussed with the patient. Treatment options were outlined including medical therapy, laser & vitrectomy. The use of injectable medications reviewed, including Avastin, Lucentis, and Eylea. Periodic injections into the eye are likely to resolve diabetic macular edema (swelling in the center of vision). Initially, injections are delivered are delivered every 4-6 weeks, and the interval extended as the condition improves. On average, 8-9 injections the first year, and 5 in year 2. Improvement in the condition most often improves on medical therapy. Occasional use of focal laser is also recommended for residual macular edema (swelling). Excellent control of blood glucose and blood pressure are encouraged under the care of a primary physician or endocrinologist. Similarly, attempts to maintain serum cholesterol, low density lipoproteins, and high-density lipoproteins in a favorable range were recommended.  ? ?Recurrence of CSME OD parafoveal, will need to retreat in 1 to 2 weeks ?

## 2021-07-11 ENCOUNTER — Encounter (INDEPENDENT_AMBULATORY_CARE_PROVIDER_SITE_OTHER): Payer: Self-pay | Admitting: Ophthalmology

## 2021-07-11 ENCOUNTER — Ambulatory Visit (INDEPENDENT_AMBULATORY_CARE_PROVIDER_SITE_OTHER): Payer: Medicare Other | Admitting: Ophthalmology

## 2021-07-11 DIAGNOSIS — E113412 Type 2 diabetes mellitus with severe nonproliferative diabetic retinopathy with macular edema, left eye: Secondary | ICD-10-CM

## 2021-07-11 DIAGNOSIS — E113411 Type 2 diabetes mellitus with severe nonproliferative diabetic retinopathy with macular edema, right eye: Secondary | ICD-10-CM | POA: Diagnosis not present

## 2021-07-11 MED ORDER — AFLIBERCEPT 2MG/0.05ML IZ SOLN FOR KALEIDOSCOPE
2.0000 mg | INTRAVITREAL | Status: AC | PRN
  Administered 2021-07-11: 2 mg via INTRAVITREAL

## 2021-07-11 NOTE — Progress Notes (Signed)
07/11/2021     CHIEF COMPLAINT Patient presents for  Chief Complaint  Patient presents with   Diabetic Retinopathy with Macular Edema      HISTORY OF PRESENT ILLNESS: Shannon Craig is a 70 y.o. female who presents to the clinic today for:   HPI   1 week Eylea OCT, OS, NO DILATE. Patient states vision is stable and unchanged since last visit. Denies any new floaters or FOL. LBS: 132, this morning per patient. Last edited by Laurin Coder on 07/11/2021  9:14 AM.      Referring physician: Merrilee Seashore, MD [address] [place],  [place] [address]  HISTORICAL INFORMATION:   Selected notes from the MEDICAL RECORD NUMBER       CURRENT MEDICATIONS: No current outpatient medications on file. (Ophthalmic Drugs)   No current facility-administered medications for this visit. (Ophthalmic Drugs)   Current Outpatient Medications (Other)  Medication Sig   Albiglutide (TANZEUM) 50 MG PEN Inject into the skin.   Alogliptin Benzoate (NESINA) 25 MG TABS Take by mouth.   atorvastatin (LIPITOR) 40 MG tablet Take 40 mg by mouth daily.   Calcium-Vitamin D-Vitamin K (VIACTIV PO) Take 1 tablet by mouth daily.   Canagliflozin (INVOKANA) 300 MG TABS Take 300 mg by mouth daily.   Certolizumab Pegol (CIMZIA) 2 X 200 MG KIT Inject 1 mL into the skin.   diltiazem (CARDIZEM) 120 MG tablet Take by mouth.   empagliflozin (JARDIANCE) 25 MG TABS tablet Take 25 mg by mouth daily.   HYDROcodone-acetaminophen (NORCO/VICODIN) 5-325 MG tablet Take 1 tablet by mouth every 6 (six) hours as needed.   InFLIXimab (REMICADE IV) Inject into the vein.   insulin aspart protamine - aspart (NOVOLOG MIX 70/30 FLEXPEN) (70-30) 100 UNIT/ML FlexPen inject 25 units before breakfast and 10 units before dinner subcutaneously   meloxicam (MOBIC) 15 MG tablet    Molnupiravir 200 MG CAPS Take 4 capules by mouth every 12 hours   Multiple Vitamin (MULTIVITAMIN) tablet Take 1 tablet by mouth daily.    pioglitazone (ACTOS) 15 MG tablet Take 15 mg by mouth daily.   ramipril (ALTACE) 5 MG capsule Take 5 mg by mouth daily.   traMADol (ULTRAM) 50 MG tablet Take 1 tablet (50 mg total) by mouth every 6 (six) hours as needed. (Patient not taking: Reported on 06/02/2019)   No current facility-administered medications for this visit. (Other)      REVIEW OF SYSTEMS:    ALLERGIES Allergies  Allergen Reactions   Darvocet [Propoxyphene N-Acetaminophen]    Gadolinium Derivatives Hives   Methotrexate Derivatives    Percodan [Oxycodone-Aspirin]     PAST MEDICAL HISTORY Past Medical History:  Diagnosis Date   Diabetes mellitus ([place])    Diverticula of colon    External hemorrhoid    Hypertension    Internal hemorrhoid    Kidney stones    Liver nodule    Nodule of kidney    Past Surgical History:  Procedure Laterality Date   [place]   HAND SURGERY  2001   left, pins   LASIK  2005   bilateral   LITHOTRIPSY  1983    FAMILY HISTORY Family History  Problem Relation Age of Onset   Breast cancer Mother    Diabetes Father    Prostate cancer Maternal Uncle    Stroke Maternal Grandmother    Heart attack Maternal Grandfather    Diabetes Brother  SOCIAL HISTORY Social History   Tobacco Use   Smoking status: Never   Smokeless tobacco: Never  Substance Use Topics   Alcohol use: No   Drug use: No         OPHTHALMIC EXAM:  Base Eye Exam     Visual Acuity (ETDRS)       Right Left   Dist [place] 20/25 20/60 -2   Dist ph   NI         Tonometry (Tonopen, 9:17 AM)       Right Left   Pressure 15 11         Pupils       Pupils Dark Light APD   Right PERRL 4 3 None   Left PERRL 4 3 None         Extraocular Movement       Right Left    Full Full         Neuro/Psych     Oriented x3: Yes   Mood/Affect: Normal         Dilation     Both eyes: No dilation @ 9:16 AM           Slit Lamp  and Fundus Exam     External Exam       Right Left   External Normal Normal         Slit Lamp Exam       Right Left   Lids/Lashes Normal Normal   Conjunctiva/Sclera White and quiet White and quiet   Cornea Clear Clear   Anterior Chamber Deep and quiet Deep and quiet   Iris Round and reactive Round and reactive   Lens Posterior chamber intraocular lens, Open posterior capsule Posterior chamber intraocular lens, Open posterior capsule   Anterior Vitreous Normal Normal         Fundus Exam       Right Left   Posterior Vitreous  Posterior vitreous detachment   Disc  Normal   C/D Ratio  0.5   Macula  superior to the FAZ region, exudates, Microaneurysms, Macular thickening centrally, severe clinically significant macular edema   Vessels  Retinopathy, severe nonproliferative diabetic retinopathy with limited peripheral PRP room temporally and superiorly for more laser   Periphery  Peripheral retinal nonperfusion nasal and inferior            IMAGING AND PROCEDURES  Imaging and Procedures for 07/11/21  Intravitreal Injection, Pharmacologic Agent - OS - Left Eye       Time Out 07/11/2021. 10:09 AM. Confirmed correct patient, procedure, site, and patient consented.   Anesthesia Topical anesthesia was used. Anesthetic medications included Lidocaine 4%.   Procedure Preparation included 5% betadine to ocular surface, 10% betadine to eyelids, Tobramycin 0.3%. A 30 gauge needle was used.   Injection: 2 mg aflibercept 2 MG/0.05ML   Route: Intravitreal, Site: Left Eye   NDC: A3590391, Lot: [id number], Waste: 0 mL   Post-op Post injection exam found visual acuity of at least counting fingers. The patient tolerated the procedure well. There were no complications. The patient received written and verbal post procedure care education. Post injection medications included ocuflox.      OCT, Retina - OU - Both Eyes       Right Eye Quality was good. Scan locations  included subfoveal. Central Foveal Thickness: 414. Progression has worsened. Findings include abnormal foveal contour, cystoid macular edema.   Left Eye Quality was good. Scan locations included subfoveal. Central Foveal Thickness:  519. Progression has worsened. Findings include abnormal foveal contour, cystoid macular edema, epiretinal membrane.   Notes    Severe worsening of center involved CSME.  OS does have an epiretinal membrane although this does not does not appear to be causative.  OD also with severe worsening of CSME may be also associated with notable lower extremity edema, volume overload systemically.  Patient does not currently use medications to offload volume and is noticing also increasing lower extremity "swelling "             ASSESSMENT/PLAN:  Severe nonproliferative diabetic retinopathy of left eye, with macular edema, associated with type 2 diabetes mellitus ([place]) Certain cases of diabetic macular edema are worse with retained fluid (edema, swelling) with   UNCONTROLLED lower extremity edema, or fluid.  Enhanced diuresis safely performed with the direction of your medical doctor may help clear and control portions of your macular edema. Follow up with physician is recommended.  Consideration for enhanced diuresis may be appropriate to assist in clearance of this form of macular edema superimposed upon diabetic macular edema  Severe nonproliferative diabetic retinopathy of right eye, with macular edema, associated with type 2 diabetes mellitus (HCC) CSME has also recurred OD.  Will need therapy in the near future     ICD-10-CM   1. Severe nonproliferative diabetic retinopathy of left eye, with macular edema, associated with type 2 diabetes mellitus (HCC)  A63.0160 Intravitreal Injection, Pharmacologic Agent - OS - Left Eye    OCT, Retina - OU - Both Eyes    aflibercept (EYLEA) SOLN 2 mg    2. Severe nonproliferative diabetic retinopathy of right eye, with  macular edema, associated with type 2 diabetes mellitus ([place])  E11.3411       1.  OS with center involved CSME massive as compared to 5 months previous.  Need to restart antivegF we will use intravitreal Eylea today.  2.  OD will need therapies promptly in 1 to 3 weeks similarly with antivegF medications we will schedule  3.  I asked the patient to follow-up with PCP so as to assess for volume overload of a mild to moderate extent with lower extremity edema.  This type of edema can also exacerbate underlying diabetic maculopathy.  Ophthalmic Meds Ordered this visit:  Meds ordered this encounter  Medications   aflibercept (EYLEA) SOLN 2 mg       Return in about 1 week (around 07/18/2021) for dilate, OD, EYLEA OCT.  There are no Patient Instructions on file for this visit.   Explained the diagnoses, plan, and follow up with the patient and they expressed understanding.  Patient expressed understanding of the importance of proper follow up care.   Clent Demark Cainen Burnham M.D. Diseases & Surgery of the Retina and Vitreous Retina & Diabetic [place] 07/11/21     Abbreviations: M myopia (nearsighted); A astigmatism; H hyperopia (farsighted); P presbyopia; Mrx spectacle prescription;  CTL contact lenses; OD right eye; OS left eye; OU both eyes  XT exotropia; ET esotropia; PEK punctate epithelial keratitis; PEE punctate epithelial erosions; DES dry eye syndrome; MGD meibomian gland dysfunction; ATs artificial tears; PFAT's preservative free artificial tears; [place] nuclear sclerotic cataract; PSC posterior subcapsular cataract; ERM epi-retinal membrane; PVD posterior vitreous detachment; RD retinal detachment; DM diabetes mellitus; DR diabetic retinopathy; NPDR non-proliferative diabetic retinopathy; PDR proliferative diabetic retinopathy; CSME clinically significant macular edema; DME diabetic macular edema; dbh dot blot hemorrhages; CWS cotton wool spot; POAG primary open angle glaucoma; C/D  cup-to-disc ratio;  HVF humphrey visual field; GVF goldmann visual field; OCT optical coherence tomography; IOP intraocular pressure; BRVO Branch retinal vein occlusion; CRVO central retinal vein occlusion; CRAO central retinal artery occlusion; BRAO branch retinal artery occlusion; RT retinal tear; SB scleral buckle; PPV pars plana vitrectomy; VH Vitreous hemorrhage; PRP panretinal laser photocoagulation; IVK intravitreal kenalog; VMT vitreomacular traction; MH Macular hole;  NVD neovascularization of the disc; NVE neovascularization elsewhere; AREDS age related eye disease study; ARMD age related macular degeneration; POAG primary open angle glaucoma; EBMD epithelial/anterior basement membrane dystrophy; ACIOL anterior chamber intraocular lens; IOL intraocular lens; PCIOL posterior chamber intraocular lens; Phaco/IOL phacoemulsification with intraocular lens placement; [place] photorefractive keratectomy; LASIK laser assisted in situ keratomileusis; HTN hypertension; DM diabetes mellitus; COPD chronic obstructive pulmonary disease

## 2021-07-11 NOTE — Assessment & Plan Note (Signed)
Certain cases of diabetic macular edema are worse with retained fluid (edema, swelling) with   UNCONTROLLED lower extremity edema, or fluid.  Enhanced diuresis safely performed with the direction of your medical doctor may help clear and control portions of your macular edema. Follow up with physician is recommended.  Consideration for enhanced diuresis may be appropriate to assist in clearance of this form of macular edema superimposed upon diabetic macular edema

## 2021-07-11 NOTE — Assessment & Plan Note (Signed)
CSME has also recurred OD.  Will need therapy in the near future

## 2021-07-17 DIAGNOSIS — R609 Edema, unspecified: Secondary | ICD-10-CM | POA: Diagnosis not present

## 2021-07-17 DIAGNOSIS — L4050 Arthropathic psoriasis, unspecified: Secondary | ICD-10-CM | POA: Diagnosis not present

## 2021-07-18 ENCOUNTER — Encounter (INDEPENDENT_AMBULATORY_CARE_PROVIDER_SITE_OTHER): Payer: Self-pay | Admitting: Ophthalmology

## 2021-07-18 ENCOUNTER — Encounter (INDEPENDENT_AMBULATORY_CARE_PROVIDER_SITE_OTHER): Payer: Medicare Other | Admitting: Ophthalmology

## 2021-07-18 ENCOUNTER — Ambulatory Visit (INDEPENDENT_AMBULATORY_CARE_PROVIDER_SITE_OTHER): Payer: Medicare Other | Admitting: Ophthalmology

## 2021-07-18 DIAGNOSIS — E113411 Type 2 diabetes mellitus with severe nonproliferative diabetic retinopathy with macular edema, right eye: Secondary | ICD-10-CM | POA: Diagnosis not present

## 2021-07-18 DIAGNOSIS — E113412 Type 2 diabetes mellitus with severe nonproliferative diabetic retinopathy with macular edema, left eye: Secondary | ICD-10-CM

## 2021-07-18 MED ORDER — AFLIBERCEPT 2MG/0.05ML IZ SOLN FOR KALEIDOSCOPE
2.0000 mg | INTRAVITREAL | Status: AC | PRN
  Administered 2021-07-18: 2 mg via INTRAVITREAL

## 2021-07-18 NOTE — Assessment & Plan Note (Signed)
Restart intravitreal Eylea OD today

## 2021-07-18 NOTE — Progress Notes (Signed)
07/18/2021     CHIEF COMPLAINT Patient presents for  Chief Complaint  Patient presents with   Diabetic Retinopathy with Macular Edema      HISTORY OF PRESENT ILLNESS: Shannon Craig is a 71 y.o. female who presents to the clinic today for:   HPI   2 weeks EYLEA, OCT, OD, NO DILATE. Pt stated vision has been about the same. Pt denies floaters and FOL. OS improved vision 1 week post most recent injection into vegF, Eylea Last edited by Hurman Horn, MD on 07/18/2021 10:30 AM.      Referring physician: Merrilee Seashore, MD [address] [place],   [address]  HISTORICAL INFORMATION:   Selected notes from the [facility]: No current outpatient medications on file. (Ophthalmic Drugs)   No current facility-administered medications for this visit. (Ophthalmic Drugs)   Current Outpatient Medications (Other)  Medication Sig   Albiglutide (TANZEUM) 50 MG PEN Inject into the skin.   Alogliptin Benzoate (NESINA) 25 MG TABS Take by mouth.   atorvastatin (LIPITOR) 40 MG tablet Take 40 mg by mouth daily.   Calcium-Vitamin D-Vitamin K (VIACTIV PO) Take 1 tablet by mouth daily.   Canagliflozin (INVOKANA) 300 MG TABS Take 300 mg by mouth daily.   Certolizumab Pegol (CIMZIA) 2 X 200 MG KIT Inject 1 mL into the skin.   diltiazem (CARDIZEM) 120 MG tablet Take by mouth.   empagliflozin (JARDIANCE) 25 MG TABS tablet Take 25 mg by mouth daily.   HYDROcodone-acetaminophen (NORCO/VICODIN) 5-325 MG tablet Take 1 tablet by mouth every 6 (six) hours as needed.   InFLIXimab (REMICADE IV) Inject into the vein.   insulin aspart protamine - aspart (NOVOLOG MIX 70/30 FLEXPEN) (70-30) 100 UNIT/ML FlexPen inject 25 units before breakfast and 10 units before dinner subcutaneously   meloxicam (MOBIC) 15 MG tablet    Molnupiravir 200 MG CAPS Take 4 capules by mouth every 12 hours   Multiple Vitamin (MULTIVITAMIN) tablet Take 1 tablet by mouth  daily.   pioglitazone (ACTOS) 15 MG tablet Take 15 mg by mouth daily.   ramipril (ALTACE) 5 MG capsule Take 5 mg by mouth daily.   traMADol (ULTRAM) 50 MG tablet Take 1 tablet (50 mg total) by mouth every 6 (six) hours as needed. (Patient not taking: Reported on 06/02/2019)   No current facility-administered medications for this visit. (Other)      REVIEW OF SYSTEMS: ROS   Negative for: Constitutional, Gastrointestinal, Neurological, Skin, Genitourinary, Musculoskeletal, HENT, Endocrine, Cardiovascular, Eyes, Respiratory, Psychiatric, Allergic/Imm, Heme/Lymph Last edited by Silvestre Moment on 07/18/2021  9:38 AM.       ALLERGIES Allergies  Allergen Reactions   Darvocet [Propoxyphene N-Acetaminophen]    Gadolinium Derivatives Hives   Methotrexate Derivatives    Percodan [Oxycodone-Aspirin]     PAST MEDICAL HISTORY Past Medical History:  Diagnosis Date   Diabetes mellitus ([place])    Diverticula of colon    External hemorrhoid    Hypertension    Internal hemorrhoid    Kidney stones    Liver nodule    Nodule of kidney    Past Surgical History:  Procedure Laterality Date   [place]   HAND SURGERY  2001   left, pins   LASIK  2005   bilateral   LITHOTRIPSY  1983    FAMILY HISTORY Family History  Problem Relation Age of Onset  Breast cancer Mother    Diabetes Father    Prostate cancer Maternal Uncle    Stroke Maternal Grandmother    Heart attack Maternal Grandfather    Diabetes Brother     SOCIAL HISTORY Social History   Tobacco Use   Smoking status: Never   Smokeless tobacco: Never  Substance Use Topics   Alcohol use: No   Drug use: No         OPHTHALMIC EXAM:  Base Eye Exam     Visual Acuity (ETDRS)       Right Left   Dist [place] 20/25 +2 20/50 -2   Dist ph [place]  NI         Tonometry (Tonopen, 9:43 AM)       Right Left   Pressure 11 10         Pupils       Pupils APD   Right PERRL None    Left PERRL None         Visual Fields       Left Right    Full Full         Extraocular Movement       Right Left    Full Full         Neuro/Psych     Oriented x3: Yes   Mood/Affect: Normal           Slit Lamp and Fundus Exam     External Exam       Right Left   External Normal Normal         Slit Lamp Exam       Right Left   Lids/Lashes Normal Normal   Conjunctiva/Sclera White and quiet White and quiet   Cornea Clear Clear   Anterior Chamber Deep and quiet Deep and quiet   Iris Round and reactive Round and reactive   Lens Posterior chamber intraocular lens, Open posterior capsule Posterior chamber intraocular lens, Open posterior capsule   Anterior Vitreous Normal Normal         Fundus Exam       Right Left   Posterior Vitreous Posterior vitreous detachment    Disc Normal    C/D Ratio 0.7    Macula Microaneurysms, Hard drusen, Early, Exudates, Severe clinically significant macular edema    Vessels NPDR-Severe    Periphery Peripheral retinal nonperfusion, good PRP superiorly and nasally,and inferiorly             IMAGING AND PROCEDURES  Imaging and Procedures for 07/18/21  OCT, Retina - OU - Both Eyes       Right Eye Quality was good. Scan locations included subfoveal. Central Foveal Thickness: 317. Progression has improved. Findings include abnormal foveal contour, cystoid macular edema.   Left Eye Quality was good. Scan locations included subfoveal. Central Foveal Thickness: 239. Progression has improved. Findings include abnormal foveal contour, cystoid macular edema, epiretinal membrane.   Notes    Recent severe worsening of center involved CSME OS, now 1 week post ejection Eylea vastly improved macular findings improved acuity  OD also with recent severe worsening of CSME may be also associated with notable lower extremity edema, volume overload systemically.  Patient does not currently use medications to offload volume and  is noticing also increasing lower extremity "swelling " OD also 1 week after injection OS, slightly improved overall.  We will commence with treatment OD to vitreal Eylea today     Intravitreal Injection, Pharmacologic Agent - OD -  Right Eye       Time Out 07/18/2021. 10:30 AM. Confirmed correct patient, procedure, site, and patient consented.   Anesthesia Topical anesthesia was used. Anesthetic medications included Lidocaine 4%.   Procedure Preparation included Ofloxacin , 10% betadine to eyelids, 5% betadine to ocular surface. A 30 gauge needle was used.   Injection: 2 mg aflibercept 2 MG/0.05ML   Route: Intravitreal, Site: Right Eye   NDC: [id number], Lot: [id number], Waste: 0 mL   Post-op Post injection exam found visual acuity of at least counting fingers. The patient tolerated the procedure well. There were no complications. The patient received written and verbal post procedure care education. Post injection medications included ocuflox.              ASSESSMENT/PLAN:  Severe nonproliferative diabetic retinopathy of right eye, with macular edema, associated with type 2 diabetes mellitus ([place]) Restart intravitreal Eylea OD today  Severe nonproliferative diabetic retinopathy of left eye, with macular edema, associated with type 2 diabetes mellitus ([place]) 1 week post most recent injection Eylea doing very well left eye follow-up as scheduled     ICD-10-CM   1. Severe nonproliferative diabetic retinopathy of right eye, with macular edema, associated with type 2 diabetes mellitus (HCC)  E11.3411 OCT, Retina - OU - Both Eyes    Intravitreal Injection, Pharmacologic Agent - OD - Right Eye    aflibercept (EYLEA) SOLN 2 mg    2. Severe nonproliferative diabetic retinopathy of left eye, with macular edema, associated with type 2 diabetes mellitus ([place])  V78.4696       1.  No dilation OU today planned injection OD for recently diagnosed CME, CSME.  2.  OS today notably  improved by OCT 1 week post most recent restart of antivegF, Eylea  3.  Follow-up OU as scheduled OD will be in 5 weeks after today  Ophthalmic Meds Ordered this visit:  Meds ordered this encounter  Medications   aflibercept (EYLEA) SOLN 2 mg       Return in about 5 weeks (around 08/22/2021) for dilate, OD, EYLEA OCT.  There are no Patient Instructions on file for this visit.   Explained the diagnoses, plan, and follow up with the patient and they expressed understanding.  Patient expressed understanding of the importance of proper follow up care.   Clent Demark Nelva Hauk M.D. Diseases & Surgery of the Retina and Vitreous Retina & Diabetic [place] 07/18/21     Abbreviations: M myopia (nearsighted); A astigmatism; H hyperopia (farsighted); P presbyopia; Mrx spectacle prescription;  CTL contact lenses; OD right eye; OS left eye; OU both eyes  XT exotropia; ET esotropia; PEK punctate epithelial keratitis; PEE punctate epithelial erosions; DES dry eye syndrome; MGD meibomian gland dysfunction; ATs artificial tears; PFAT's preservative free artificial tears; [place] nuclear sclerotic cataract; PSC posterior subcapsular cataract; ERM epi-retinal membrane; PVD posterior vitreous detachment; RD retinal detachment; DM diabetes mellitus; DR diabetic retinopathy; NPDR non-proliferative diabetic retinopathy; PDR proliferative diabetic retinopathy; CSME clinically significant macular edema; DME diabetic macular edema; dbh dot blot hemorrhages; CWS cotton wool spot; POAG primary open angle glaucoma; C/D cup-to-disc ratio; HVF humphrey visual field; GVF goldmann visual field; OCT optical coherence tomography; IOP intraocular pressure; BRVO Branch retinal vein occlusion; CRVO central retinal vein occlusion; CRAO central retinal artery occlusion; BRAO branch retinal artery occlusion; RT retinal tear; SB scleral buckle; PPV pars plana vitrectomy; VH Vitreous hemorrhage; PRP panretinal laser photocoagulation; IVK  intravitreal kenalog; VMT vitreomacular traction; MH Macular hole;  NVD neovascularization of the  disc; NVE neovascularization elsewhere; AREDS age related eye disease study; ARMD age related macular degeneration; POAG primary open angle glaucoma; EBMD epithelial/anterior basement membrane dystrophy; ACIOL anterior chamber intraocular lens; IOL intraocular lens; PCIOL posterior chamber intraocular lens; Phaco/IOL phacoemulsification with intraocular lens placement; [place] photorefractive keratectomy; LASIK laser assisted in situ keratomileusis; HTN hypertension; DM diabetes mellitus; COPD chronic obstructive pulmonary disease

## 2021-07-18 NOTE — Assessment & Plan Note (Signed)
1 week post most recent injection Eylea doing very well left eye follow-up as scheduled

## 2021-07-25 DIAGNOSIS — R600 Localized edema: Secondary | ICD-10-CM | POA: Diagnosis not present

## 2021-07-25 DIAGNOSIS — I700 Atherosclerosis of aorta: Secondary | ICD-10-CM | POA: Diagnosis not present

## 2021-07-29 DIAGNOSIS — I700 Atherosclerosis of aorta: Secondary | ICD-10-CM | POA: Diagnosis not present

## 2021-07-29 DIAGNOSIS — I872 Venous insufficiency (chronic) (peripheral): Secondary | ICD-10-CM | POA: Diagnosis not present

## 2021-07-29 DIAGNOSIS — E782 Mixed hyperlipidemia: Secondary | ICD-10-CM | POA: Diagnosis not present

## 2021-07-29 DIAGNOSIS — E1140 Type 2 diabetes mellitus with diabetic neuropathy, unspecified: Secondary | ICD-10-CM | POA: Diagnosis not present

## 2021-07-29 DIAGNOSIS — M810 Age-related osteoporosis without current pathological fracture: Secondary | ICD-10-CM | POA: Diagnosis not present

## 2021-07-29 DIAGNOSIS — I10 Essential (primary) hypertension: Secondary | ICD-10-CM | POA: Diagnosis not present

## 2021-07-29 DIAGNOSIS — L4050 Arthropathic psoriasis, unspecified: Secondary | ICD-10-CM | POA: Diagnosis not present

## 2021-07-29 DIAGNOSIS — E113413 Type 2 diabetes mellitus with severe nonproliferative diabetic retinopathy with macular edema, bilateral: Secondary | ICD-10-CM | POA: Diagnosis not present

## 2021-07-29 DIAGNOSIS — R600 Localized edema: Secondary | ICD-10-CM | POA: Diagnosis not present

## 2021-07-29 DIAGNOSIS — Z794 Long term (current) use of insulin: Secondary | ICD-10-CM | POA: Diagnosis not present

## 2021-08-14 DIAGNOSIS — L4050 Arthropathic psoriasis, unspecified: Secondary | ICD-10-CM | POA: Diagnosis not present

## 2021-08-22 ENCOUNTER — Encounter (INDEPENDENT_AMBULATORY_CARE_PROVIDER_SITE_OTHER): Payer: Self-pay | Admitting: Ophthalmology

## 2021-08-22 ENCOUNTER — Encounter (INDEPENDENT_AMBULATORY_CARE_PROVIDER_SITE_OTHER): Payer: Medicare Other | Admitting: Ophthalmology

## 2021-08-22 ENCOUNTER — Ambulatory Visit (INDEPENDENT_AMBULATORY_CARE_PROVIDER_SITE_OTHER): Payer: Medicare Other | Admitting: Ophthalmology

## 2021-08-22 DIAGNOSIS — E113412 Type 2 diabetes mellitus with severe nonproliferative diabetic retinopathy with macular edema, left eye: Secondary | ICD-10-CM

## 2021-08-22 DIAGNOSIS — E113411 Type 2 diabetes mellitus with severe nonproliferative diabetic retinopathy with macular edema, right eye: Secondary | ICD-10-CM | POA: Diagnosis not present

## 2021-08-22 MED ORDER — AFLIBERCEPT 2MG/0.05ML IZ SOLN FOR KALEIDOSCOPE
2.0000 mg | INTRAVITREAL | Status: AC | PRN
  Administered 2021-08-22: 2 mg via INTRAVITREAL

## 2021-08-22 NOTE — Progress Notes (Signed)
08/22/2021     CHIEF COMPLAINT Patient presents for  Chief Complaint  Patient presents with   Diabetic Retinopathy with Macular Edema      HISTORY OF PRESENT ILLNESS: Shannon Craig is a 71 y.o. female who presents to the clinic today for:   HPI   5 weeks for DILATE, OD, EYLEA, OCT. Pt stated vision has been stable since last visit.  Last edited by Silvestre Moment on 08/22/2021 10:29 AM.      Referring physician: Merrilee Seashore, MD [address] [place],  [place] [address]  HISTORICAL INFORMATION:   Selected notes from the [facility]: No current outpatient medications on file. (Ophthalmic Drugs)   No current facility-administered medications for this visit. (Ophthalmic Drugs)   Current Outpatient Medications (Other)  Medication Sig   Albiglutide (TANZEUM) 50 MG PEN Inject into the skin.   Alogliptin Benzoate (NESINA) 25 MG TABS Take by mouth.   atorvastatin (LIPITOR) 40 MG tablet Take 40 mg by mouth daily.   Calcium-Vitamin D-Vitamin K (VIACTIV PO) Take 1 tablet by mouth daily.   Canagliflozin (INVOKANA) 300 MG TABS Take 300 mg by mouth daily.   Certolizumab Pegol (CIMZIA) 2 X 200 MG KIT Inject 1 mL into the skin.   diltiazem (CARDIZEM) 120 MG tablet Take by mouth.   empagliflozin (JARDIANCE) 25 MG TABS tablet Take 25 mg by mouth daily.   HYDROcodone-acetaminophen (NORCO/VICODIN) 5-325 MG tablet Take 1 tablet by mouth every 6 (six) hours as needed.   InFLIXimab (REMICADE IV) Inject into the vein.   insulin aspart protamine - aspart (NOVOLOG MIX 70/30 FLEXPEN) (70-30) 100 UNIT/ML FlexPen inject 25 units before breakfast and 10 units before dinner subcutaneously   meloxicam (MOBIC) 15 MG tablet    Molnupiravir 200 MG CAPS Take 4 capules by mouth every 12 hours   Multiple Vitamin (MULTIVITAMIN) tablet Take 1 tablet by mouth daily.   pioglitazone (ACTOS) 15 MG tablet Take 15 mg by mouth daily.   ramipril (ALTACE) 5 MG  capsule Take 5 mg by mouth daily.   traMADol (ULTRAM) 50 MG tablet Take 1 tablet (50 mg total) by mouth every 6 (six) hours as needed. (Patient not taking: Reported on 06/02/2019)   No current facility-administered medications for this visit. (Other)      REVIEW OF SYSTEMS: ROS   Negative for: Constitutional, Gastrointestinal, Neurological, Skin, Genitourinary, Musculoskeletal, HENT, Endocrine, Cardiovascular, Eyes, Respiratory, Psychiatric, Allergic/Imm, Heme/Lymph Last edited by Silvestre Moment on 08/22/2021 10:29 AM.       ALLERGIES Allergies  Allergen Reactions   Darvocet [Propoxyphene N-Acetaminophen]    Gadolinium Derivatives Hives   Methotrexate Derivatives    Percodan [Oxycodone-Aspirin]     PAST MEDICAL HISTORY Past Medical History:  Diagnosis Date   Diabetes mellitus ([place])    Diverticula of colon    External hemorrhoid    Hypertension    Internal hemorrhoid    Kidney stones    Liver nodule    Nodule of kidney    Past Surgical History:  Procedure Laterality Date   [place]   HAND SURGERY  2001   left, pins   LASIK  2005   bilateral   LITHOTRIPSY  1983    FAMILY HISTORY Family History  Problem Relation Age of Onset   Breast cancer Mother    Diabetes Father    Prostate cancer Maternal Uncle  Stroke Maternal Grandmother    Heart attack Maternal Grandfather    Diabetes Brother     SOCIAL HISTORY Social History   Tobacco Use   Smoking status: Never   Smokeless tobacco: Never  Substance Use Topics   Alcohol use: No   Drug use: No         OPHTHALMIC EXAM:  Base Eye Exam     Visual Acuity (ETDRS)       Right Left   Dist [place] 20/20 -1 20/40   Dist ph [place]  NI         Tonometry (Tonopen, 10:35 AM)       Right Left   Pressure 17 13         Pupils       Pupils APD   Right PERRL None   Left PERRL None         Visual Fields       Left Right    Full Full         Extraocular  Movement       Right Left    Full Full         Neuro/Psych     Oriented x3: Yes   Mood/Affect: Normal         Dilation     Right eye: 2.5% Phenylephrine, 1.0% Mydriacyl @ 10:35 AM           Slit Lamp and Fundus Exam     External Exam       Right Left   External Normal Normal         Slit Lamp Exam       Right Left   Lids/Lashes Normal Normal   Conjunctiva/Sclera White and quiet White and quiet   Cornea Clear Clear   Anterior Chamber Deep and quiet Deep and quiet   Iris Round and reactive Round and reactive   Lens Posterior chamber intraocular lens, Open posterior capsule Posterior chamber intraocular lens, Open posterior capsule   Anterior Vitreous Normal Normal         Fundus Exam       Right Left   Posterior Vitreous Posterior vitreous detachment    Disc Normal    C/D Ratio 0.7    Macula Microaneurysms, Hard drusen, Early, Exudates, Moderate clinically significant macular edema    Vessels NPDR-Severe    Periphery Peripheral retinal nonperfusion, good PRP superiorly and nasally,and inferiorly             IMAGING AND PROCEDURES  Imaging and Procedures for 08/22/21  OCT, Retina - OU - Both Eyes       Right Eye Quality was good. Scan locations included subfoveal. Central Foveal Thickness: 232. Progression has improved. Findings include abnormal foveal contour, cystoid macular edema.   Left Eye Quality was good. Scan locations included subfoveal. Central Foveal Thickness: 214. Progression has improved. Findings include abnormal foveal contour, cystoid macular edema, epiretinal membrane.   Notes    Recent severe worsening of center involved CSME OS, now 6 week post ejection Eylea vastly improved macular findings improved acuity  OD also with recent severe worsening of CSME may be also associated with notable lower extremity edema, volume overload systemically.  Patient does not currently use medications to offload volume and is noticing  also increasing lower extremity "swelling ". We will retreat with intravitreal Eylea OD today  OD also 5 week after injection OS, slightly improved overall active but improving.       Intravitreal  Injection, Pharmacologic Agent - OD - Right Eye       Time Out 08/22/2021. 11:10 AM. Confirmed correct patient, procedure, site, and patient consented.   Anesthesia Topical anesthesia was used. Anesthetic medications included Lidocaine 4%.   Procedure Preparation included 5% betadine to ocular surface, 10% betadine to eyelids, Ofloxacin . A 30 gauge needle was used.   Injection: 2 mg aflibercept 2 MG/0.05ML   Route: Intravitreal, Site: Right Eye   NDC: [id number], Lot: [id number], Expiration date: 04/25/2022, Waste: 0 mL   Post-op Post injection exam found visual acuity of at least counting fingers. The patient tolerated the procedure well. There were no complications. The patient received written and verbal post procedure care education. Post injection medications included ocuflox.              ASSESSMENT/PLAN:  Severe nonproliferative diabetic retinopathy of right eye, with macular edema, associated with type 2 diabetes mellitus ([place]) Vastly improved post most recent Eylea 1 month previous.  We will repeat injection today and maintain 1 month follow-up OD  Severe nonproliferative diabetic retinopathy of left eye, with macular edema, associated with type 2 diabetes mellitus ([place]) Still active CSME OS follow-up as scheduled     ICD-10-CM   1. Severe nonproliferative diabetic retinopathy of right eye, with macular edema, associated with type 2 diabetes mellitus (HCC)  E11.3411 OCT, Retina - OU - Both Eyes    Intravitreal Injection, Pharmacologic Agent - OD - Right Eye    aflibercept (EYLEA) SOLN 2 mg    2. Severe nonproliferative diabetic retinopathy of left eye, with macular edema, associated with type 2 diabetes mellitus ([place])  B76.2831       1.  OD vastly improved some 5  weeks post most recent injection for severe center involved CSME.  Repeat Eylea today maintain  2.  Center threatening CSME OS improved but still active.  Follow-up within 1 week left eye for Eylea OCT and  3.  Dilate OD next in 1 month likely Eylea and OCT OD  Ophthalmic Meds Ordered this visit:  Meds ordered this encounter  Medications   aflibercept (EYLEA) SOLN 2 mg       Return in about 1 month (around 09/21/2021) for dilate, EYLEA OCT, OD,, follow-up OS as scheduled,, 1 week OS Eylea OCT.  There are no Patient Instructions on file for this visit.   Explained the diagnoses, plan, and follow up with the patient and they expressed understanding.  Patient expressed understanding of the importance of proper follow up care.   Clent Demark Donnamaria Shands M.D. Diseases & Surgery of the Retina and Vitreous Retina & Diabetic [place] 08/22/21     Abbreviations: M myopia (nearsighted); A astigmatism; H hyperopia (farsighted); P presbyopia; Mrx spectacle prescription;  CTL contact lenses; OD right eye; OS left eye; OU both eyes  XT exotropia; ET esotropia; PEK punctate epithelial keratitis; PEE punctate epithelial erosions; DES dry eye syndrome; MGD meibomian gland dysfunction; ATs artificial tears; PFAT's preservative free artificial tears; [place] nuclear sclerotic cataract; PSC posterior subcapsular cataract; ERM epi-retinal membrane; PVD posterior vitreous detachment; RD retinal detachment; DM diabetes mellitus; DR diabetic retinopathy; NPDR non-proliferative diabetic retinopathy; PDR proliferative diabetic retinopathy; CSME clinically significant macular edema; DME diabetic macular edema; dbh dot blot hemorrhages; CWS cotton wool spot; POAG primary open angle glaucoma; C/D cup-to-disc ratio; HVF humphrey visual field; GVF goldmann visual field; OCT optical coherence tomography; IOP intraocular pressure; BRVO Branch retinal vein occlusion; CRVO central retinal vein occlusion; CRAO central retinal artery  occlusion; BRAO branch retinal artery occlusion; RT retinal tear; SB scleral buckle; PPV pars plana vitrectomy; VH Vitreous hemorrhage; PRP panretinal laser photocoagulation; IVK intravitreal kenalog; VMT vitreomacular traction; MH Macular hole;  NVD neovascularization of the disc; NVE neovascularization elsewhere; AREDS age related eye disease study; ARMD age related macular degeneration; POAG primary open angle glaucoma; EBMD epithelial/anterior basement membrane dystrophy; ACIOL anterior chamber intraocular lens; IOL intraocular lens; PCIOL posterior chamber intraocular lens; Phaco/IOL phacoemulsification with intraocular lens placement; [place] photorefractive keratectomy; LASIK laser assisted in situ keratomileusis; HTN hypertension; DM diabetes mellitus; COPD chronic obstructive pulmonary disease

## 2021-08-22 NOTE — Assessment & Plan Note (Signed)
Still active CSME OS follow-up as scheduled

## 2021-08-22 NOTE — Assessment & Plan Note (Signed)
Vastly improved post most recent Eylea 1 month previous.  We will repeat injection today and maintain 1 month follow-up OD

## 2021-08-29 ENCOUNTER — Encounter (INDEPENDENT_AMBULATORY_CARE_PROVIDER_SITE_OTHER): Payer: Self-pay | Admitting: Ophthalmology

## 2021-08-29 ENCOUNTER — Ambulatory Visit (INDEPENDENT_AMBULATORY_CARE_PROVIDER_SITE_OTHER): Payer: Medicare Other | Admitting: Ophthalmology

## 2021-08-29 DIAGNOSIS — H35372 Puckering of macula, left eye: Secondary | ICD-10-CM | POA: Diagnosis not present

## 2021-08-29 DIAGNOSIS — E113412 Type 2 diabetes mellitus with severe nonproliferative diabetic retinopathy with macular edema, left eye: Secondary | ICD-10-CM

## 2021-08-29 DIAGNOSIS — E113411 Type 2 diabetes mellitus with severe nonproliferative diabetic retinopathy with macular edema, right eye: Secondary | ICD-10-CM | POA: Diagnosis not present

## 2021-08-29 MED ORDER — AFLIBERCEPT 2MG/0.05ML IZ SOLN FOR KALEIDOSCOPE
2.0000 mg | INTRAVITREAL | Status: AC | PRN
  Administered 2021-08-29: 2 mg via INTRAVITREAL

## 2021-08-29 NOTE — Assessment & Plan Note (Addendum)
OS, overall improved since reinstitution of treatment 7 weeks previous.  However recurrence of 7-week interval today.  Repeat injection today and follow-up again next left eye in 5 weeks  Still with some lower extremity edema may have some constitutional volume overload slightly making this macular edema worse yet the responsiveness to of antivegF suggest ongoing diabetic eye disease

## 2021-08-29 NOTE — Progress Notes (Signed)
08/29/2021     CHIEF COMPLAINT Patient presents for  Chief Complaint  Patient presents with   Diabetic Retinopathy with Macular Edema      HISTORY OF PRESENT ILLNESS: Shannon Craig is a 71 y.o. female who presents to the clinic today for:     Referring physician: Merrilee Seashore, [address] [place] [place],  [place] [address]  HISTORICAL INFORMATION:   Selected notes from the [facility]: No current outpatient medications on file. (Ophthalmic Drugs)   No current facility-administered medications for this visit. (Ophthalmic Drugs)   Current Outpatient Medications (Other)  Medication Sig   Albiglutide (TANZEUM) 50 MG PEN Inject into the skin.   Alogliptin Benzoate (NESINA) 25 MG TABS Take by mouth.   atorvastatin (LIPITOR) 40 MG tablet Take 40 mg by mouth daily.   Calcium-Vitamin D-Vitamin K (VIACTIV PO) Take 1 tablet by mouth daily.   Canagliflozin (INVOKANA) 300 MG TABS Take 300 mg by mouth daily.   Certolizumab Pegol (CIMZIA) 2 X 200 MG KIT Inject 1 mL into the skin.   diltiazem (CARDIZEM) 120 MG tablet Take by mouth.   empagliflozin (JARDIANCE) 25 MG TABS tablet Take 25 mg by mouth daily.   HYDROcodone-acetaminophen (NORCO/VICODIN) 5-325 MG tablet Take 1 tablet by mouth every 6 (six) hours as needed.   InFLIXimab (REMICADE IV) Inject into the vein.   insulin aspart protamine - aspart (NOVOLOG MIX 70/30 FLEXPEN) (70-30) 100 UNIT/ML FlexPen inject 25 units before breakfast and 10 units before dinner subcutaneously   meloxicam (MOBIC) 15 MG tablet    Molnupiravir 200 MG CAPS Take 4 capules by mouth every 12 hours   Multiple Vitamin (MULTIVITAMIN) tablet Take 1 tablet by mouth daily.   pioglitazone (ACTOS) 15 MG tablet Take 15 mg by mouth daily.   ramipril (ALTACE) 5 MG capsule Take 5 mg by mouth daily.   traMADol (ULTRAM) 50 MG tablet Take 1 tablet (50 mg total) by mouth every 6 (six) hours as needed. (Patient not  taking: Reported on 06/02/2019)   No current facility-administered medications for this visit. (Other)      REVIEW OF SYSTEMS: ROS   Negative for: Constitutional, Gastrointestinal, Neurological, Skin, Genitourinary, Musculoskeletal, HENT, Endocrine, Cardiovascular, Eyes, Respiratory, Psychiatric, Allergic/Imm, Heme/Lymph Last edited by Hurman Horn, MD on 08/29/2021  8:01 AM.       ALLERGIES Allergies  Allergen Reactions   Darvocet [Propoxyphene N-Acetaminophen]    Gadolinium Derivatives Hives   Methotrexate Derivatives    Percodan [Oxycodone-Aspirin]     PAST MEDICAL HISTORY Past Medical History:  Diagnosis Date   Diabetes mellitus ([place])    Diverticula of colon    External hemorrhoid    Hypertension    Internal hemorrhoid    Kidney stones    Liver nodule    Nodule of kidney    Past Surgical History:  Procedure Laterality Date   [place]   HAND SURGERY  2001   left, pins   LASIK  2005   bilateral   LITHOTRIPSY  1983    FAMILY HISTORY Family History  Problem Relation Age of Onset   Breast cancer Mother    Diabetes Father    Prostate cancer Maternal Uncle    Stroke Maternal Grandmother    Heart attack Maternal Grandfather    Diabetes Brother     SOCIAL HISTORY Social History   Tobacco Use  Smoking status: Never   Smokeless tobacco: Never  Substance Use Topics   Alcohol use: No   Drug use: No         OPHTHALMIC EXAM:  Base Eye Exam     Visual Acuity (ETDRS)       Right Left   Dist [place] 20/20 -1 20/60 -2   Dist ph [place]  NI         Pupils       Pupils APD   Right PERRL None   Left PERRL None         Visual Fields       Left Right    Full Full         Extraocular Movement       Right Left    Full, Ortho Full, Ortho         Neuro/Psych     Oriented x3: Yes   Mood/Affect: Normal         Dilation     Left eye: 1.0% Mydriacyl, 2.5% Phenylephrine @ 8:02 AM            Slit Lamp and Fundus Exam     External Exam       Right Left   External Normal, pitting edema of shins 1+ right Normal, bilateral lower extremity pitting edema 2+ left         Slit Lamp Exam       Right Left   Lids/Lashes Normal Normal   Conjunctiva/Sclera White and quiet White and quiet   Cornea Clear Clear   Anterior Chamber Deep and quiet Deep and quiet   Iris Round and reactive Round and reactive   Lens Posterior chamber intraocular lens, Open posterior capsule Posterior chamber intraocular lens, Open posterior capsule   Anterior Vitreous Normal Normal         Fundus Exam       Right Left   Posterior Vitreous  Posterior vitreous detachment   Disc  Normal   C/D Ratio  0.5   Macula  superior to the FAZ region, exudates, Microaneurysms, Macular thickening centrally, severe clinically significant macular edema   Vessels  Retinopathy, severe nonproliferative diabetic retinopathy with limited peripheral PRP room temporally and superiorly for more laser   Periphery  Peripheral retinal nonperfusion nasal and inferior            IMAGING AND PROCEDURES  Imaging and Procedures for 08/29/21  OCT, Retina - OU - Both Eyes       Right Eye Quality was good. Scan locations included subfoveal. Central Foveal Thickness: 230. Progression has improved. Findings include abnormal foveal contour, cystoid macular edema.   Left Eye Quality was good. Scan locations included subfoveal. Central Foveal Thickness: 298. Progression has worsened. Findings include abnormal foveal contour, cystoid macular edema, epiretinal membrane.   Notes    Recent severe worsening of center involved CSME OS, now 7 week post ejection Eylea vastly improved macular findings improved acuity  OD also with recent severe worsening of CSME may be also associated with notable lower extremity edema, volume overload systemically.  Patient does not currently use medications to offload volume and is  noticing also increasing lower extremity "swelling ". OD also 1 week after injection , slightly improved overall active but improving.       Intravitreal Injection, Pharmacologic Agent - OS - Left Eye       Time Out 08/29/2021. 8:37 AM. Confirmed correct patient, procedure, site, and patient consented.   Anesthesia  Topical anesthesia was used. Anesthetic medications included Lidocaine 4%.   Procedure Preparation included 5% betadine to ocular surface, 10% betadine to eyelids, Tobramycin 0.3%. A 30 gauge needle was used.   Injection: 2 mg aflibercept 2 MG/0.05ML   Route: Intravitreal, Site: Left Eye   NDC: [id number], Lot: [id number], Expiration date: 04/25/2022, Waste: 0 mL   Post-op Post injection exam found visual acuity of at least counting fingers. The patient tolerated the procedure well. There were no complications. The patient received written and verbal post procedure care education. Post injection medications included ocuflox.              ASSESSMENT/PLAN:  Severe nonproliferative diabetic retinopathy of left eye, with macular edema, associated with type 2 diabetes mellitus (HCC) OS, overall improved since reinstitution of treatment 7 weeks previous.  However recurrence of 7-week interval today.  Repeat injection today and follow-up again next left eye in 5 weeks  Still with some lower extremity edema may have some constitutional volume overload slightly making this macular edema worse yet the responsiveness to of antivegF suggest ongoing diabetic eye disease  Severe nonproliferative diabetic retinopathy of right eye, with macular edema, associated with type 2 diabetes mellitus (HCC) Improved and stable 1 week after recent injection, follow-up as scheduled  Epiretinal membrane, left eye Preserved not impactful     ICD-10-CM   1. Severe nonproliferative diabetic retinopathy of left eye, with macular edema, associated with type 2 diabetes mellitus (HCC)  X32.4401  OCT, Retina - OU - Both Eyes    Intravitreal Injection, Pharmacologic Agent - OS - Left Eye    aflibercept (EYLEA) SOLN 2 mg    2. Severe nonproliferative diabetic retinopathy of right eye, with macular edema, associated with type 2 diabetes mellitus ([place])  E11.3411     3. Epiretinal membrane, left eye  H35.372       1.  OS recurrence of center threatening CSME.  We will repeat injection Eylea today at 7-week interval and shorten interval examination next to 5 weeks  2.  OD improved after recent injection follow-up as scheduled  3.  Ophthalmic Meds Ordered this visit:  Meds ordered this encounter  Medications   aflibercept (EYLEA) SOLN 2 mg       Return in about 5 weeks (around 10/03/2021) for dilate, OS, EYLEA OCT,, and follow-up OD as scheduled.  There are no Patient Instructions on file for this visit.   Explained the diagnoses, plan, and follow up with the patient and they expressed understanding.  Patient expressed understanding of the importance of proper follow up care.   Clent Demark  M.D. Diseases & Surgery of the Retina and Vitreous Retina & Diabetic [place] 08/29/21     Abbreviations: M myopia (nearsighted); A astigmatism; H hyperopia (farsighted); P presbyopia; Mrx spectacle prescription;  CTL contact lenses; OD right eye; OS left eye; OU both eyes  XT exotropia; ET esotropia; PEK punctate epithelial keratitis; PEE punctate epithelial erosions; DES dry eye syndrome; MGD meibomian gland dysfunction; ATs artificial tears; PFAT's preservative free artificial tears; [place] nuclear sclerotic cataract; PSC posterior subcapsular cataract; ERM epi-retinal membrane; PVD posterior vitreous detachment; RD retinal detachment; DM diabetes mellitus; DR diabetic retinopathy; NPDR non-proliferative diabetic retinopathy; PDR proliferative diabetic retinopathy; CSME clinically significant macular edema; DME diabetic macular edema; dbh dot blot hemorrhages; CWS cotton wool spot; POAG  primary open angle glaucoma; C/D cup-to-disc ratio; HVF humphrey visual field; GVF goldmann visual field; OCT optical coherence tomography; IOP intraocular pressure; BRVO Branch retinal vein occlusion;  CRVO central retinal vein occlusion; CRAO central retinal artery occlusion; BRAO branch retinal artery occlusion; RT retinal tear; SB scleral buckle; PPV pars plana vitrectomy; VH Vitreous hemorrhage; PRP panretinal laser photocoagulation; IVK intravitreal kenalog; VMT vitreomacular traction; MH Macular hole;  NVD neovascularization of the disc; NVE neovascularization elsewhere; AREDS age related eye disease study; ARMD age related macular degeneration; POAG primary open angle glaucoma; EBMD epithelial/anterior basement membrane dystrophy; ACIOL anterior chamber intraocular lens; IOL intraocular lens; PCIOL posterior chamber intraocular lens; Phaco/IOL phacoemulsification with intraocular lens placement; [place] photorefractive keratectomy; LASIK laser assisted in situ keratomileusis; HTN hypertension; DM diabetes mellitus; COPD chronic obstructive pulmonary disease

## 2021-08-29 NOTE — Assessment & Plan Note (Signed)
Improved and stable 1 week after recent injection, follow-up as scheduled

## 2021-08-29 NOTE — Assessment & Plan Note (Signed)
Preserved not impactful

## 2021-09-06 DIAGNOSIS — E782 Mixed hyperlipidemia: Secondary | ICD-10-CM | POA: Diagnosis not present

## 2021-09-06 DIAGNOSIS — E118 Type 2 diabetes mellitus with unspecified complications: Secondary | ICD-10-CM | POA: Diagnosis not present

## 2021-09-06 DIAGNOSIS — I10 Essential (primary) hypertension: Secondary | ICD-10-CM | POA: Diagnosis not present

## 2021-09-06 DIAGNOSIS — Z9641 Presence of insulin pump (external) (internal): Secondary | ICD-10-CM | POA: Diagnosis not present

## 2021-09-11 DIAGNOSIS — L4050 Arthropathic psoriasis, unspecified: Secondary | ICD-10-CM | POA: Diagnosis not present

## 2021-09-19 ENCOUNTER — Ambulatory Visit (INDEPENDENT_AMBULATORY_CARE_PROVIDER_SITE_OTHER): Payer: Medicare Other | Admitting: Ophthalmology

## 2021-09-19 ENCOUNTER — Encounter (INDEPENDENT_AMBULATORY_CARE_PROVIDER_SITE_OTHER): Payer: Medicare Other | Admitting: Ophthalmology

## 2021-09-19 ENCOUNTER — Encounter (INDEPENDENT_AMBULATORY_CARE_PROVIDER_SITE_OTHER): Payer: Self-pay | Admitting: Ophthalmology

## 2021-09-19 DIAGNOSIS — E113411 Type 2 diabetes mellitus with severe nonproliferative diabetic retinopathy with macular edema, right eye: Secondary | ICD-10-CM | POA: Diagnosis not present

## 2021-09-19 DIAGNOSIS — M810 Age-related osteoporosis without current pathological fracture: Secondary | ICD-10-CM | POA: Diagnosis not present

## 2021-09-19 DIAGNOSIS — E113412 Type 2 diabetes mellitus with severe nonproliferative diabetic retinopathy with macular edema, left eye: Secondary | ICD-10-CM

## 2021-09-19 MED ORDER — AFLIBERCEPT 2MG/0.05ML IZ SOLN FOR KALEIDOSCOPE
2.0000 mg | INTRAVITREAL | Status: AC | PRN
  Administered 2021-09-19: 2 mg via INTRAVITREAL

## 2021-09-19 NOTE — Assessment & Plan Note (Signed)
OD, center involved CSME vastly improved since changed to [facility].  Proven resistance to Avastin in the past.  Visual acuity also improving.  Follow-up after today's injection in 6 weeks

## 2021-09-19 NOTE — Progress Notes (Signed)
  09/19/2021     CHIEF COMPLAINT Patient presents for  Chief Complaint  Patient presents with   Diabetic Retinopathy with Macular Edema      HISTORY OF PRESENT ILLNESS: Shannon Craig is a 71 y.o. female who presents to the clinic today for:   HPI   1mth dilate OD EYLEA OCT Pt states her vision has been stable  Pt admits to seeing "white circles spiraling in a circle"  Pt denies any new floaters or FOL    1 month interval with much improved vision as well as much less CSME by OCT.  Post Eylea injection.  We will repeat injection today maintain  Last edited by ,  A, MD on 09/19/2021  8:59 AM.      Referring physician: Ramachandran, Ajith, MD [address] [place],  [place] [address]  HISTORICAL INFORMATION:   Selected notes from the medical record:        CURRENT MEDICATIONS: No current outpatient medications on file. (Ophthalmic Drugs)   No current facility-administered medications for this visit. (Ophthalmic Drugs)   Current Outpatient Medications (Other)  Medication Sig   Albiglutide (TANZEUM) 50 MG PEN Inject into the skin.   Alogliptin Benzoate (NESINA) 25 MG TABS Take by mouth.   atorvastatin (LIPITOR) 40 MG tablet Take 40 mg by mouth daily.   Calcium-Vitamin D-Vitamin K (VIACTIV PO) Take 1 tablet by mouth daily.   Canagliflozin (INVOKANA) 300 MG TABS Take 300 mg by mouth daily.   Certolizumab Pegol (CIMZIA) 2 X 200 MG KIT Inject 1 mL into the skin.   diltiazem (CARDIZEM) 120 MG tablet Take by mouth.   empagliflozin (JARDIANCE) 25 MG TABS tablet Take 25 mg by mouth daily.   HYDROcodone-acetaminophen (NORCO/VICODIN) 5-325 MG tablet Take 1 tablet by mouth every 6 (six) hours as needed.   InFLIXimab (REMICADE IV) Inject into the vein.   insulin aspart protamine - aspart (NOVOLOG MIX 70/30 FLEXPEN) (70-30) 100 UNIT/ML FlexPen inject 25 units before breakfast and 10 units before dinner subcutaneously   meloxicam (MOBIC) 15 MG tablet     Molnupiravir 200 MG CAPS Take 4 capules by mouth every 12 hours   Multiple Vitamin (MULTIVITAMIN) tablet Take 1 tablet by mouth daily.   pioglitazone (ACTOS) 15 MG tablet Take 15 mg by mouth daily.   ramipril (ALTACE) 5 MG capsule Take 5 mg by mouth daily.   traMADol (ULTRAM) 50 MG tablet Take 1 tablet (50 mg total) by mouth every 6 (six) hours as needed. (Patient not taking: Reported on 06/02/2019)   No current facility-administered medications for this visit. (Other)      REVIEW OF SYSTEMS: ROS   Negative for: Constitutional, Gastrointestinal, Neurological, Skin, Genitourinary, Musculoskeletal, HENT, Endocrine, Cardiovascular, Eyes, Respiratory, Psychiatric, Allergic/Imm, Heme/Lymph Last edited by Palmer, Destiny D, CMA on 09/19/2021  8:08 AM.       ALLERGIES Allergies  Allergen Reactions   Darvocet [Propoxyphene N-Acetaminophen]    Gadolinium Derivatives Hives   Methotrexate Derivatives    Percodan [Oxycodone-Aspirin]     PAST MEDICAL HISTORY Past Medical History:  Diagnosis Date   Diabetes mellitus (HCC)    Diverticula of colon    External hemorrhoid    Hypertension    Internal hemorrhoid    Kidney stones    Liver nodule    Nodule of kidney    Past Surgical History:  Procedure Laterality Date   ABDOMINAL HYSTERECTOMY  1991   BREAST REDUCTION SURGERY  1985   HAND SURGERY  2001     left, pins   LASIK  2005   bilateral   LITHOTRIPSY  1983    FAMILY HISTORY Family History  Problem Relation Age of Onset   Breast cancer Mother    Diabetes Father    Prostate cancer Maternal Uncle    Stroke Maternal Grandmother    Heart attack Maternal Grandfather    Diabetes Brother     SOCIAL HISTORY Social History   Tobacco Use   Smoking status: Never   Smokeless tobacco: Never  Substance Use Topics   Alcohol use: No   Drug use: No         OPHTHALMIC EXAM:  Base Eye Exam     Visual Acuity (ETDRS)       Right Left   Dist [place] 20/20 20/30 +2          Tonometry (Tonopen, 8:14 AM)       Right Left   Pressure 5 12         Neuro/Psych     Oriented x3: Yes   Mood/Affect: Normal         Dilation     Right eye: 2.5% Phenylephrine, 1.0% Mydriacyl @ 8:12 AM           Slit Lamp and Fundus Exam     External Exam       Right Left   External Normal Normal         Slit Lamp Exam       Right Left   Lids/Lashes Normal Normal   Conjunctiva/Sclera White and quiet White and quiet   Cornea Clear Clear   Anterior Chamber Deep and quiet Deep and quiet   Iris Round and reactive Round and reactive   Lens Posterior chamber intraocular lens, Open posterior capsule Posterior chamber intraocular lens, Open posterior capsule   Anterior Vitreous Normal Normal         Fundus Exam       Right Left   Posterior Vitreous Posterior vitreous detachment    Disc Normal    C/D Ratio 0.7    Macula Microaneurysms, Hard drusen, Early, Exudates, Moderate clinically significant macular edema    Vessels NPDR-Severe    Periphery Peripheral retinal nonperfusion, good PRP superiorly and nasally,and inferiorly             IMAGING AND PROCEDURES  Imaging and Procedures for 09/19/21  OCT, Retina - OU - Both Eyes       Right Eye Quality was good. Scan locations included subfoveal. Central Foveal Thickness: 215. Progression has improved. Findings include abnormal foveal contour, cystoid macular edema.   Left Eye Quality was good. Scan locations included subfoveal. Central Foveal Thickness: 205. Progression has worsened. Findings include abnormal foveal contour, cystoid macular edema, epiretinal membrane.   Notes    Macular thickening much improved from 230 m now to 15 center involved OD  OS also much improved superonasal to FAZ with center involved CSME thickness of 298 now down to 205.     Intravitreal Injection, Pharmacologic Agent - OD - Right Eye       Time Out 09/19/2021. 9:00 AM. Confirmed correct patient, procedure,  site, and patient consented.   Anesthesia Topical anesthesia was used. Anesthetic medications included Lidocaine 4%.   Procedure Preparation included 5% betadine to ocular surface, 10% betadine to eyelids, Ofloxacin . A 30 gauge needle was used.   Injection: 2 mg aflibercept 2 MG/0.05ML   Route: Intravitreal, Site: Right Eye   NDC: 61755-005-01, Lot: [id number], Expiration date:   10/27/2022, Waste: 0 mL   Post-op Post injection exam found visual acuity of at least counting fingers. The patient tolerated the procedure well. There were no complications. The patient received written and verbal post procedure care education. Post injection medications included ocuflox.              ASSESSMENT/PLAN:  Severe nonproliferative diabetic retinopathy of right eye, with macular edema, associated with type 2 diabetes mellitus ([place]) OD, center involved CSME vastly improved since changed to [facility].  Proven resistance to Avastin in the past.  Visual acuity also improving.  Follow-up after today's injection in 6 weeks  Severe nonproliferative diabetic retinopathy of left eye, with macular edema, associated with type 2 diabetes mellitus (HCC) OS, improving also.  Follow-up as scheduled      ICD-10-CM   1. Severe nonproliferative diabetic retinopathy of right eye, with macular edema, associated with type 2 diabetes mellitus (HCC)  E11.3411 OCT, Retina - OU - Both Eyes    Intravitreal Injection, Pharmacologic Agent - OD - Right Eye    aflibercept (EYLEA) SOLN 2 mg    2. Severe nonproliferative diabetic retinopathy of left eye, with macular edema, associated with type 2 diabetes mellitus ([place])  Z00.1749       1.  Patient to report any changes or worsening of vision in either eye.  2.  3.  Ophthalmic Meds Ordered this visit:  Meds ordered this encounter  Medications   aflibercept (EYLEA) SOLN 2 mg       Return in about 6 weeks (around 10/31/2021) for dilate, OD, EYLEA OCT, and follow-up  OS as scheduled.  There are no Patient Instructions on file for this visit.   Explained the diagnoses, plan, and follow up with the patient and they expressed understanding.  Patient expressed understanding of the importance of proper follow up care.   Clent Demark Camdyn Laden M.D. Diseases & Surgery of the Retina and Vitreous Retina & Diabetic [place] 09/19/21     Abbreviations: M myopia (nearsighted); A astigmatism; H hyperopia (farsighted); P presbyopia; Mrx spectacle prescription;  CTL contact lenses; OD right eye; OS left eye; OU both eyes  XT exotropia; ET esotropia; PEK punctate epithelial keratitis; PEE punctate epithelial erosions; DES dry eye syndrome; MGD meibomian gland dysfunction; ATs artificial tears; PFAT's preservative free artificial tears; [place] nuclear sclerotic cataract; PSC posterior subcapsular cataract; ERM epi-retinal membrane; PVD posterior vitreous detachment; RD retinal detachment; DM diabetes mellitus; DR diabetic retinopathy; NPDR non-proliferative diabetic retinopathy; PDR proliferative diabetic retinopathy; CSME clinically significant macular edema; DME diabetic macular edema; dbh dot blot hemorrhages; CWS cotton wool spot; POAG primary open angle glaucoma; C/D cup-to-disc ratio; HVF humphrey visual field; GVF goldmann visual field; OCT optical coherence tomography; IOP intraocular pressure; BRVO Branch retinal vein occlusion; CRVO central retinal vein occlusion; CRAO central retinal artery occlusion; BRAO branch retinal artery occlusion; RT retinal tear; SB scleral buckle; PPV pars plana vitrectomy; VH Vitreous hemorrhage; PRP panretinal laser photocoagulation; IVK intravitreal kenalog; VMT vitreomacular traction; MH Macular hole;  NVD neovascularization of the disc; NVE neovascularization elsewhere; AREDS age related eye disease study; ARMD age related macular degeneration; POAG primary open angle glaucoma; EBMD epithelial/anterior basement membrane dystrophy; ACIOL anterior  chamber intraocular lens; IOL intraocular lens; PCIOL posterior chamber intraocular lens; Phaco/IOL phacoemulsification with intraocular lens placement; [place] photorefractive keratectomy; LASIK laser assisted in situ keratomileusis; HTN hypertension; DM diabetes mellitus; COPD chronic obstructive pulmonary disease

## 2021-09-19 NOTE — Assessment & Plan Note (Signed)
OS, improving also.  Follow-up as scheduled

## 2021-10-03 ENCOUNTER — Encounter (INDEPENDENT_AMBULATORY_CARE_PROVIDER_SITE_OTHER): Payer: Self-pay | Admitting: Ophthalmology

## 2021-10-03 ENCOUNTER — Ambulatory Visit (INDEPENDENT_AMBULATORY_CARE_PROVIDER_SITE_OTHER): Payer: Medicare Other | Admitting: Ophthalmology

## 2021-10-03 DIAGNOSIS — E113411 Type 2 diabetes mellitus with severe nonproliferative diabetic retinopathy with macular edema, right eye: Secondary | ICD-10-CM

## 2021-10-03 DIAGNOSIS — E113412 Type 2 diabetes mellitus with severe nonproliferative diabetic retinopathy with macular edema, left eye: Secondary | ICD-10-CM | POA: Diagnosis not present

## 2021-10-03 MED ORDER — AFLIBERCEPT 2MG/0.05ML IZ SOLN FOR KALEIDOSCOPE
2.0000 mg | INTRAVITREAL | Status: AC | PRN
  Administered 2021-10-03: 2 mg via INTRAVITREAL

## 2021-10-03 NOTE — Assessment & Plan Note (Signed)
Vastly improved macular findings on Eylea since restart May 2023, now at 5 weeks vastly improved macular findings.  Will repeat injection today and reevaluate again in 5 weeks

## 2021-10-03 NOTE — Assessment & Plan Note (Signed)
Continued improvement on 6 CSME therapy with Eylea OD.  Now some 2 weeks post most recent injection follow-up as scheduled

## 2021-10-03 NOTE — Progress Notes (Signed)
10/03/2021     CHIEF COMPLAINT Patient presents for  Chief Complaint  Patient presents with   Diabetic Retinopathy with Macular Edema      HISTORY OF PRESENT ILLNESS: Shannon Craig is a 71 y.o. female who presents to the clinic today for:   HPI   5 weeks for FU OS EYLEA OCT Pt stated vision has been stable since last visit.  Last edited by Silvestre Moment on 10/03/2021  7:56 AM.      Referring physician: Merrilee Seashore, MD [address] [place],  [place] [address]  HISTORICAL INFORMATION:   Selected notes from the MEDICAL RECORD NUMBER       CURRENT MEDICATIONS: No current outpatient medications on file. (Ophthalmic Drugs)   No current facility-administered medications for this visit. (Ophthalmic Drugs)   Current Outpatient Medications (Other)  Medication Sig   Albiglutide (TANZEUM) 50 MG PEN Inject into the skin.   Alogliptin Benzoate (NESINA) 25 MG TABS Take by mouth.   atorvastatin (LIPITOR) 40 MG tablet Take 40 mg by mouth daily.   Calcium-Vitamin D-Vitamin K (VIACTIV PO) Take 1 tablet by mouth daily.   Canagliflozin (INVOKANA) 300 MG TABS Take 300 mg by mouth daily.   Certolizumab Pegol (CIMZIA) 2 X 200 MG KIT Inject 1 mL into the skin.   diltiazem (CARDIZEM) 120 MG tablet Take by mouth.   empagliflozin (JARDIANCE) 25 MG TABS tablet Take 25 mg by mouth daily.   HYDROcodone-acetaminophen (NORCO/VICODIN) 5-325 MG tablet Take 1 tablet by mouth every 6 (six) hours as needed.   InFLIXimab (REMICADE IV) Inject into the vein.   insulin aspart protamine - aspart (NOVOLOG MIX 70/30 FLEXPEN) (70-30) 100 UNIT/ML FlexPen inject 25 units before breakfast and 10 units before dinner subcutaneously   meloxicam (MOBIC) 15 MG tablet    Molnupiravir 200 MG CAPS Take 4 capules by mouth every 12 hours   Multiple Vitamin (MULTIVITAMIN) tablet Take 1 tablet by mouth daily.   pioglitazone (ACTOS) 15 MG tablet Take 15 mg by mouth daily.   ramipril (ALTACE) 5 MG capsule  Take 5 mg by mouth daily.   traMADol (ULTRAM) 50 MG tablet Take 1 tablet (50 mg total) by mouth every 6 (six) hours as needed. (Patient not taking: Reported on 06/02/2019)   No current facility-administered medications for this visit. (Other)      REVIEW OF SYSTEMS: ROS   Negative for: Constitutional, Gastrointestinal, Neurological, Skin, Genitourinary, Musculoskeletal, HENT, Endocrine, Cardiovascular, Eyes, Respiratory, Psychiatric, Allergic/Imm, Heme/Lymph Last edited by Silvestre Moment on 10/03/2021  7:56 AM.       ALLERGIES Allergies  Allergen Reactions   Darvocet [Propoxyphene N-Acetaminophen]    Gadolinium Derivatives Hives   Methotrexate Derivatives    Percodan [Oxycodone-Aspirin]     PAST MEDICAL HISTORY Past Medical History:  Diagnosis Date   Diabetes mellitus ([place])    Diverticula of colon    External hemorrhoid    Hypertension    Internal hemorrhoid    Kidney stones    Liver nodule    Nodule of kidney    Past Surgical History:  Procedure Laterality Date   [place]   HAND SURGERY  2001   left, pins   LASIK  2005   bilateral   LITHOTRIPSY  1983    FAMILY HISTORY Family History  Problem Relation Age of Onset   Breast cancer Mother    Diabetes Father    Prostate cancer Maternal Uncle  Stroke Maternal Grandmother    Heart attack Maternal Grandfather    Diabetes Brother     SOCIAL HISTORY Social History   Tobacco Use   Smoking status: Never   Smokeless tobacco: Never  Substance Use Topics   Alcohol use: No   Drug use: No         OPHTHALMIC EXAM:  Base Eye Exam     Visual Acuity (ETDRS)       Right Left   Dist [place] 20/20 -2 20/40 -2   Dist ph   NI         Tonometry (Tonopen, 8:00 AM)       Right Left   Pressure 13 13         Pupils       Pupils APD   Right PERRL None   Left PERRL None         Visual Fields       Left Right    Full Full         Extraocular  Movement       Right Left    Full, Ortho Full, Ortho         Neuro/Psych     Oriented x3: Yes   Mood/Affect: Normal         Dilation     Left eye: 2.5% Phenylephrine, 1.0% Mydriacyl @ 8:00 AM           Slit Lamp and Fundus Exam     External Exam       Right Left   External Normal, pitting edema of shins 1+ right Normal, bilateral lower extremity pitting edema 2+ left         Slit Lamp Exam       Right Left   Lids/Lashes Normal Normal   Conjunctiva/Sclera White and quiet White and quiet   Cornea Clear Clear   Anterior Chamber Deep and quiet Deep and quiet   Iris Round and reactive Round and reactive   Lens Posterior chamber intraocular lens, Open posterior capsule Posterior chamber intraocular lens, Open posterior capsule   Anterior Vitreous Normal Normal         Fundus Exam       Right Left   Posterior Vitreous  Posterior vitreous detachment   Disc  Normal   C/D Ratio  0.5   Macula  superior to the FAZ region, exudates, Microaneurysms, Macular thickening centrally, much less thickening   Vessels  Retinopathy, severe nonproliferative diabetic retinopathy with limited peripheral PRP room temporally and superiorly for more laser   Periphery  Peripheral retinal nonperfusion nasal and inferior room temporally for more laser if needed            IMAGING AND PROCEDURES  Imaging and Procedures for 10/03/21  OCT, Retina - OU - Both Eyes       Right Eye Quality was good. Scan locations included subfoveal. Central Foveal Thickness: 212. Progression has improved. Findings include abnormal foveal contour, cystoid macular edema.   Left Eye Quality was good. Scan locations included subfoveal. Central Foveal Thickness: 198. Progression has improved. Findings include abnormal foveal contour, cystoid macular edema, epiretinal membrane.   Notes    Macular thickening much improved from 230 m now to 15 center involved OD  OS also much improved superonasal  to FAZ with center involved CSME thickness of continued stability and improvement in CSME superonasal to FAZ OS, repeat injection Eylea today at 5-week interval      Intravitreal Injection,  Pharmacologic Agent - OS - Left Eye       Time Out 10/03/2021. 8:52 AM. Confirmed correct patient, procedure, site, and patient consented.   Anesthesia Topical anesthesia was used. Anesthetic medications included Lidocaine 4%.   Procedure Preparation included 5% betadine to ocular surface, 10% betadine to eyelids, Tobramycin 0.3%. A 30 gauge needle was used.   Injection: 2 mg aflibercept 2 MG/0.05ML   Route: Intravitreal, Site: Left Eye   NDC: [id number], Lot: [id number], Expiration date: 10/27/2022, Waste: 0 mL   Post-op Post injection exam found visual acuity of at least counting fingers. The patient tolerated the procedure well. There were no complications. The patient received written and verbal post procedure care education. Post injection medications included ocuflox.              ASSESSMENT/PLAN:  Severe nonproliferative diabetic retinopathy of right eye, with macular edema, associated with type 2 diabetes mellitus ([place]) Continued improvement on 6 CSME therapy with Eylea OD.  Now some 2 weeks post most recent injection follow-up as scheduled  Severe nonproliferative diabetic retinopathy of left eye, with macular edema, associated with type 2 diabetes mellitus ([place]) Vastly improved macular findings on Eylea since restart May 2023, now at 5 weeks vastly improved macular findings.  Will repeat injection today and reevaluate again in 5 weeks     ICD-10-CM   1. Severe nonproliferative diabetic retinopathy of left eye, with macular edema, associated with type 2 diabetes mellitus (HCC)  L57.2620 OCT, Retina - OU - Both Eyes    Intravitreal Injection, Pharmacologic Agent - OS - Left Eye    aflibercept (EYLEA) SOLN 2 mg    2. Severe nonproliferative diabetic retinopathy of right eye, with  macular edema, associated with type 2 diabetes mellitus ([place])  E11.3411       1.  OS vastly improved macular findings and CSME and stable acuity.  Post injections of Eylea to control diabetic CSME and severe NPDR.  At 5-week interval today repeat injection and reevaluate again in 5 weeks  2.  OD also improved by OCT 2 weeks after recent injection confirming effective nests of Eylea.  Follow-up as scheduled  3.  Ophthalmic Meds Ordered this visit:  Meds ordered this encounter  Medications   aflibercept (EYLEA) SOLN 2 mg       Return in about 5 weeks (around 11/07/2021) for dilate, OS, EYLEA OCT,, and follow-up OD as scheduled.  There are no Patient Instructions on file for this visit.   Explained the diagnoses, plan, and follow up with the patient and they expressed understanding.  Patient expressed understanding of the importance of proper follow up care.   Clent Demark Emerson Schreifels M.D. Diseases & Surgery of the Retina and Vitreous Retina & Diabetic [place] 10/03/21     Abbreviations: M myopia (nearsighted); A astigmatism; H hyperopia (farsighted); P presbyopia; Mrx spectacle prescription;  CTL contact lenses; OD right eye; OS left eye; OU both eyes  XT exotropia; ET esotropia; PEK punctate epithelial keratitis; PEE punctate epithelial erosions; DES dry eye syndrome; MGD meibomian gland dysfunction; ATs artificial tears; PFAT's preservative free artificial tears; [place] nuclear sclerotic cataract; PSC posterior subcapsular cataract; ERM epi-retinal membrane; PVD posterior vitreous detachment; RD retinal detachment; DM diabetes mellitus; DR diabetic retinopathy; NPDR non-proliferative diabetic retinopathy; PDR proliferative diabetic retinopathy; CSME clinically significant macular edema; DME diabetic macular edema; dbh dot blot hemorrhages; CWS cotton wool spot; POAG primary open angle glaucoma; C/D cup-to-disc ratio; HVF humphrey visual field; GVF goldmann visual field; OCT optical  coherence  tomography; IOP intraocular pressure; BRVO Branch retinal vein occlusion; CRVO central retinal vein occlusion; CRAO central retinal artery occlusion; BRAO branch retinal artery occlusion; RT retinal tear; SB scleral buckle; PPV pars plana vitrectomy; VH Vitreous hemorrhage; PRP panretinal laser photocoagulation; IVK intravitreal kenalog; VMT vitreomacular traction; MH Macular hole;  NVD neovascularization of the disc; NVE neovascularization elsewhere; AREDS age related eye disease study; ARMD age related macular degeneration; POAG primary open angle glaucoma; EBMD epithelial/anterior basement membrane dystrophy; ACIOL anterior chamber intraocular lens; IOL intraocular lens; PCIOL posterior chamber intraocular lens; Phaco/IOL phacoemulsification with intraocular lens placement; [place] photorefractive keratectomy; LASIK laser assisted in situ keratomileusis; HTN hypertension; DM diabetes mellitus; COPD chronic obstructive pulmonary disease

## 2021-10-14 DIAGNOSIS — L4050 Arthropathic psoriasis, unspecified: Secondary | ICD-10-CM | POA: Diagnosis not present

## 2021-10-31 ENCOUNTER — Encounter (INDEPENDENT_AMBULATORY_CARE_PROVIDER_SITE_OTHER): Payer: Self-pay | Admitting: Ophthalmology

## 2021-10-31 ENCOUNTER — Ambulatory Visit (INDEPENDENT_AMBULATORY_CARE_PROVIDER_SITE_OTHER): Payer: Medicare Other | Admitting: Ophthalmology

## 2021-10-31 DIAGNOSIS — E113411 Type 2 diabetes mellitus with severe nonproliferative diabetic retinopathy with macular edema, right eye: Secondary | ICD-10-CM | POA: Diagnosis not present

## 2021-10-31 DIAGNOSIS — E113412 Type 2 diabetes mellitus with severe nonproliferative diabetic retinopathy with macular edema, left eye: Secondary | ICD-10-CM | POA: Diagnosis not present

## 2021-10-31 MED ORDER — AFLIBERCEPT 2MG/0.05ML IZ SOLN FOR KALEIDOSCOPE
2.0000 mg | INTRAVITREAL | Status: AC | PRN
  Administered 2021-10-31: 2 mg via INTRAVITREAL

## 2021-10-31 NOTE — Assessment & Plan Note (Signed)
Recurrent massive CSME has improved nicely since May 2023 currently at 6-week interval OD.  We will repeat injection today to maintain and reevaluate next in 8 weeks

## 2021-10-31 NOTE — Progress Notes (Signed)
10/31/2021     CHIEF COMPLAINT Patient presents for  Chief Complaint  Patient presents with   Diabetic Retinopathy with Macular Edema      HISTORY OF PRESENT ILLNESS: Shannon Craig is a 71 y.o. female who presents to the clinic today for:   HPI   6 weeks for DILATE OD, EYLEA OCT. Pt stated vision has remained stable since last visit.  Last edited by Silvestre Moment on 10/31/2021  8:37 AM.      Referring physician: Merrilee Seashore, MD [address] [place],  [place] [address]  HISTORICAL INFORMATION:   Selected notes from the [facility]: No current outpatient medications on file. (Ophthalmic Drugs)   No current facility-administered medications for this visit. (Ophthalmic Drugs)   Current Outpatient Medications (Other)  Medication Sig   Albiglutide (TANZEUM) 50 MG PEN Inject into the skin.   Alogliptin Benzoate (NESINA) 25 MG TABS Take by mouth.   atorvastatin (LIPITOR) 40 MG tablet Take 40 mg by mouth daily.   Calcium-Vitamin D-Vitamin K (VIACTIV PO) Take 1 tablet by mouth daily.   Canagliflozin (INVOKANA) 300 MG TABS Take 300 mg by mouth daily.   Certolizumab Pegol (CIMZIA) 2 X 200 MG KIT Inject 1 mL into the skin.   diltiazem (CARDIZEM) 120 MG tablet Take by mouth.   empagliflozin (JARDIANCE) 25 MG TABS tablet Take 25 mg by mouth daily.   HYDROcodone-acetaminophen (NORCO/VICODIN) 5-325 MG tablet Take 1 tablet by mouth every 6 (six) hours as needed.   InFLIXimab (REMICADE IV) Inject into the vein.   insulin aspart protamine - aspart (NOVOLOG MIX 70/30 FLEXPEN) (70-30) 100 UNIT/ML FlexPen inject 25 units before breakfast and 10 units before dinner subcutaneously   meloxicam (MOBIC) 15 MG tablet    Molnupiravir 200 MG CAPS Take 4 capules by mouth every 12 hours   Multiple Vitamin (MULTIVITAMIN) tablet Take 1 tablet by mouth daily.   pioglitazone (ACTOS) 15 MG tablet Take 15 mg by mouth daily.   ramipril (ALTACE) 5 MG  capsule Take 5 mg by mouth daily.   traMADol (ULTRAM) 50 MG tablet Take 1 tablet (50 mg total) by mouth every 6 (six) hours as needed. (Patient not taking: Reported on 06/02/2019)   No current facility-administered medications for this visit. (Other)      REVIEW OF SYSTEMS: ROS   Negative for: Constitutional, Gastrointestinal, Neurological, Skin, Genitourinary, Musculoskeletal, HENT, Endocrine, Cardiovascular, Eyes, Respiratory, Psychiatric, Allergic/Imm, Heme/Lymph Last edited by Silvestre Moment on 10/31/2021  8:37 AM.       ALLERGIES Allergies  Allergen Reactions   Darvocet [Propoxyphene N-Acetaminophen]    Gadolinium Derivatives Hives   Methotrexate Derivatives    Percodan [Oxycodone-Aspirin]     PAST MEDICAL HISTORY Past Medical History:  Diagnosis Date   Diabetes mellitus ([place])    Diverticula of colon    External hemorrhoid    Hypertension    Internal hemorrhoid    Kidney stones    Liver nodule    Nodule of kidney    Past Surgical History:  Procedure Laterality Date   [place]   HAND SURGERY  2001   left, pins   LASIK  2005   bilateral   LITHOTRIPSY  1983    FAMILY HISTORY Family History  Problem Relation Age of Onset   Breast cancer Mother    Diabetes Father    Prostate cancer Maternal Uncle  Stroke Maternal Grandmother    Heart attack Maternal Grandfather    Diabetes Brother     SOCIAL HISTORY Social History   Tobacco Use   Smoking status: Never   Smokeless tobacco: Never  Substance Use Topics   Alcohol use: No   Drug use: No         OPHTHALMIC EXAM:  Base Eye Exam     Visual Acuity (ETDRS)       Right Left   Dist [place] 20/20 -2 20/40 -2   Dist ph [place]  20/30         Tonometry (Tonopen, 8:43 AM)       Right Left   Pressure 12 14         Pupils       Pupils APD   Right PERRL None   Left PERRL None         Visual Fields       Left Right    Full Full          Extraocular Movement       Right Left    Full, Ortho Full, Ortho         Neuro/Psych     Oriented x3: Yes   Mood/Affect: Normal         Dilation     Right eye: 2.5% Phenylephrine, 1.0% Mydriacyl @ 8:43 AM           Slit Lamp and Fundus Exam     External Exam       Right Left   External Normal Normal         Slit Lamp Exam       Right Left   Lids/Lashes Normal Normal   Conjunctiva/Sclera White and quiet White and quiet   Cornea Clear Clear   Anterior Chamber Deep and quiet Deep and quiet   Iris Round and reactive Round and reactive   Lens Posterior chamber intraocular lens, Open posterior capsule Posterior chamber intraocular lens, Open posterior capsule   Anterior Vitreous Normal Normal         Fundus Exam       Right Left   Posterior Vitreous Posterior vitreous detachment    Disc Normal    C/D Ratio 0.7    Macula Microaneurysms, Hard drusen, Early, Exudates, Moderate clinically significant macular edema    Vessels NPDR-Severe    Periphery Peripheral retinal nonperfusion, good PRP superiorly and nasally,and inferiorly             IMAGING AND PROCEDURES  Imaging and Procedures for 10/31/21  OCT, Retina - OU - Both Eyes       Right Eye Quality was good. Scan locations included subfoveal. Central Foveal Thickness: 213. Progression has improved. Findings include abnormal foveal contour, cystoid macular edema.   Left Eye Quality was good. Scan locations included subfoveal. Central Foveal Thickness: 191. Progression has improved. Findings include abnormal foveal contour, cystoid macular edema, epiretinal membrane.   Notes    Macular thickening much improved from 230 m now to 13 m OD, at 6-week interval  OS also much improved superonasal to FAZ with center involved CSME thickness of continued stability and improvement in CSME superonasal to FAZ OS, repeat i valuation as scheduled OS at -week interval      Intravitreal Injection,  Pharmacologic Agent - OD - Right Eye       Time Out 10/31/2021. 9:29 AM. Confirmed correct patient, procedure, site, and patient consented.   Anesthesia Topical anesthesia  was used. Anesthetic medications included Lidocaine 4%.   Procedure Preparation included 5% betadine to ocular surface, 10% betadine to eyelids, Ofloxacin . A 30 gauge needle was used.   Injection: 2 mg aflibercept 2 MG/0.05ML   Route: Intravitreal, Site: Right Eye   NDC: [id number], Lot: [id number], Expiration date: 10/27/2022, Waste: 0 mL   Post-op Post injection exam found visual acuity of at least counting fingers. The patient tolerated the procedure well. There were no complications. The patient received written and verbal post procedure care education. Post injection medications included ocuflox.              ASSESSMENT/PLAN:  Severe nonproliferative diabetic retinopathy of left eye, with macular edema, associated with type 2 diabetes mellitus ([place]) Center involved CSME has improved rather nicely and maintained at 4-week interval post injection OS today follow-up as scheduled  Severe nonproliferative diabetic retinopathy of right eye, with macular edema, associated with type 2 diabetes mellitus ([place]) Recurrent massive CSME has improved nicely since May 2023 currently at 6-week interval OD.  We will repeat injection today to maintain and reevaluate next in 8 weeks     ICD-10-CM   1. Severe nonproliferative diabetic retinopathy of right eye, with macular edema, associated with type 2 diabetes mellitus (H[facility])  E11.3411 OCT, Retina - OU - Both Eyes    Intravitreal Injection, Pharmacologic Agent - OD - Right Eye    aflibercept (EYLEA) SOLN 2 mg    2. Severe nonproliferative diabetic retinopathy of left eye, with macular edema, associated with type 2 diabetes mellitus ([place])  W96.7591       1.  OD significant improvement in vision acuity as well as anatomy and resolution of CSME on intravitreal Eylea since  reinstitution May 2023.  Currently at 6-week interval.  Repeat injection today and reevaluate next in 8 weeks  2.  OS improved at 4 weeks maintained CSME.  Follow-up next week as scheduled likely i increase of interval after next week's evaluation as well  3.  Ophthalmic Meds Ordered this visit:  Meds ordered this encounter  Medications   aflibercept (EYLEA) SOLN 2 mg       Return in about 8 weeks (around 12/26/2021) for dilate, OD, EYLEA OCT, and follow-up OS as scheduled.  There are no Patient Instructions on file for this visit.   Explained the diagnoses, plan, and follow up with the patient and they expressed understanding.  Patient expressed understanding of the importance of proper follow up care.   Clent Demark Adalea Handler M.D. Diseases & Surgery of the Retina and Vitreous Retina & Diabetic [place] 10/31/21     Abbreviations: M myopia (nearsighted); A astigmatism; H hyperopia (farsighted); P presbyopia; Mrx spectacle prescription;  CTL contact lenses; OD right eye; OS left eye; OU both eyes  XT exotropia; ET esotropia; PEK punctate epithelial keratitis; PEE punctate epithelial erosions; DES dry eye syndrome; MGD meibomian gland dysfunction; ATs artificial tears; PFAT's preservative free artificial tears; [place] nuclear sclerotic cataract; PSC posterior subcapsular cataract; ERM epi-retinal membrane; PVD posterior vitreous detachment; RD retinal detachment; DM diabetes mellitus; DR diabetic retinopathy; NPDR non-proliferative diabetic retinopathy; PDR proliferative diabetic retinopathy; CSME clinically significant macular edema; DME diabetic macular edema; dbh dot blot hemorrhages; CWS cotton wool spot; POAG primary open angle glaucoma; C/D cup-to-disc ratio; HVF humphrey visual field; GVF goldmann visual field; OCT optical coherence tomography; IOP intraocular pressure; BRVO Branch retinal vein occlusion; CRVO central retinal vein occlusion; CRAO central retinal artery occlusion; BRAO  branch retinal artery occlusion; RT retinal  tear; SB scleral buckle; PPV pars plana vitrectomy; VH Vitreous hemorrhage; PRP panretinal laser photocoagulation; IVK intravitreal kenalog; VMT vitreomacular traction; MH Macular hole;  NVD neovascularization of the disc; NVE neovascularization elsewhere; AREDS age related eye disease study; ARMD age related macular degeneration; POAG primary open angle glaucoma; EBMD epithelial/anterior basement membrane dystrophy; ACIOL anterior chamber intraocular lens; IOL intraocular lens; PCIOL posterior chamber intraocular lens; Phaco/IOL phacoemulsification with intraocular lens placement; [place] photorefractive keratectomy; LASIK laser assisted in situ keratomileusis; HTN hypertension; DM diabetes mellitus; COPD chronic obstructive pulmonary disease

## 2021-10-31 NOTE — Assessment & Plan Note (Signed)
Center involved CSME has improved rather nicely and maintained at 4-week interval post injection OS today follow-up as scheduled

## 2021-11-07 ENCOUNTER — Ambulatory Visit (INDEPENDENT_AMBULATORY_CARE_PROVIDER_SITE_OTHER): Payer: Medicare Other | Admitting: Ophthalmology

## 2021-11-07 ENCOUNTER — Encounter (INDEPENDENT_AMBULATORY_CARE_PROVIDER_SITE_OTHER): Payer: Self-pay | Admitting: Ophthalmology

## 2021-11-07 DIAGNOSIS — E113411 Type 2 diabetes mellitus with severe nonproliferative diabetic retinopathy with macular edema, right eye: Secondary | ICD-10-CM

## 2021-11-07 DIAGNOSIS — E113412 Type 2 diabetes mellitus with severe nonproliferative diabetic retinopathy with macular edema, left eye: Secondary | ICD-10-CM

## 2021-11-07 MED ORDER — AFLIBERCEPT 2MG/0.05ML IZ SOLN FOR KALEIDOSCOPE
2.0000 mg | INTRAVITREAL | Status: AC | PRN
  Administered 2021-11-07: 2 mg via INTRAVITREAL

## 2021-11-07 NOTE — Assessment & Plan Note (Signed)
OS doing very well.  Since recent exacerbation of CSME May 2023.  On Eylea.  We will repeat injection today and now extend interval examination next 8 weeks

## 2021-11-07 NOTE — Progress Notes (Signed)
11/07/2021     CHIEF COMPLAINT Patient presents for  Chief Complaint  Patient presents with   Diabetic Retinopathy with Macular Edema      HISTORY OF PRESENT ILLNESS: Shannon Craig is a 71 y.o. female who presents to the clinic today for:   HPI   5 weeks for DILATE OS, EYLEA, OCT. Pt stated vision has been stable.  Last edited by Silvestre Moment on 11/07/2021  8:35 AM.      Referring physician: Merrilee Seashore, MD [address] [place],  [place] [address]  HISTORICAL INFORMATION:   Selected notes from the [facility]: No current outpatient medications on file. (Ophthalmic Drugs)   No current facility-administered medications for this visit. (Ophthalmic Drugs)   Current Outpatient Medications (Other)  Medication Sig   Albiglutide (TANZEUM) 50 MG PEN Inject into the skin.   Alogliptin Benzoate (NESINA) 25 MG TABS Take by mouth.   atorvastatin (LIPITOR) 40 MG tablet Take 40 mg by mouth daily.   Calcium-Vitamin D-Vitamin K (VIACTIV PO) Take 1 tablet by mouth daily.   Canagliflozin (INVOKANA) 300 MG TABS Take 300 mg by mouth daily.   Certolizumab Pegol (CIMZIA) 2 X 200 MG KIT Inject 1 mL into the skin.   diltiazem (CARDIZEM) 120 MG tablet Take by mouth.   empagliflozin (JARDIANCE) 25 MG TABS tablet Take 25 mg by mouth daily.   HYDROcodone-acetaminophen (NORCO/VICODIN) 5-325 MG tablet Take 1 tablet by mouth every 6 (six) hours as needed.   InFLIXimab (REMICADE IV) Inject into the vein.   insulin aspart protamine - aspart (NOVOLOG MIX 70/30 FLEXPEN) (70-30) 100 UNIT/ML FlexPen inject 25 units before breakfast and 10 units before dinner subcutaneously   meloxicam (MOBIC) 15 MG tablet    Molnupiravir 200 MG CAPS Take 4 capules by mouth every 12 hours   Multiple Vitamin (MULTIVITAMIN) tablet Take 1 tablet by mouth daily.   pioglitazone (ACTOS) 15 MG tablet Take 15 mg by mouth daily.   ramipril (ALTACE) 5 MG capsule Take 5 mg by  mouth daily.   traMADol (ULTRAM) 50 MG tablet Take 1 tablet (50 mg total) by mouth every 6 (six) hours as needed. (Patient not taking: Reported on 06/02/2019)   No current facility-administered medications for this visit. (Other)      REVIEW OF SYSTEMS: ROS   Negative for: Constitutional, Gastrointestinal, Neurological, Skin, Genitourinary, Musculoskeletal, HENT, Endocrine, Cardiovascular, Eyes, Respiratory, Psychiatric, Allergic/Imm, Heme/Lymph Last edited by Silvestre Moment on 11/07/2021  8:35 AM.       ALLERGIES Allergies  Allergen Reactions   Darvocet [Propoxyphene N-Acetaminophen]    Gadolinium Derivatives Hives   Methotrexate Derivatives    Percodan [Oxycodone-Aspirin]     PAST MEDICAL HISTORY Past Medical History:  Diagnosis Date   Diabetes mellitus ([place])    Diverticula of colon    External hemorrhoid    Hypertension    Internal hemorrhoid    Kidney stones    Liver nodule    Nodule of kidney    Past Surgical History:  Procedure Laterality Date   [place]   HAND SURGERY  2001   left, pins   LASIK  2005   bilateral   LITHOTRIPSY  1983    FAMILY HISTORY Family History  Problem Relation Age of Onset   Breast cancer Mother    Diabetes Father    Prostate cancer Maternal Uncle    Stroke  Maternal Grandmother    Heart attack Maternal Grandfather    Diabetes Brother     SOCIAL HISTORY Social History   Tobacco Use   Smoking status: Never   Smokeless tobacco: Never  Substance Use Topics   Alcohol use: No   Drug use: No         OPHTHALMIC EXAM:  Base Eye Exam     Visual Acuity (ETDRS)       Right Left   Dist [place] 20/20 20/30 -1   Dist ph [place]  NI         Tonometry (Tonopen, 8:40 AM)       Right Left   Pressure 16 14         Pupils       Pupils APD   Right PERRL None   Left PERRL None         Visual Fields       Left Right    Full    Restrictions  Partial outer inferior temporal,  inferior nasal deficiencies         Extraocular Movement       Right Left    Full, Ortho Full, Ortho         Neuro/Psych     Oriented x3: Yes   Mood/Affect: Normal         Dilation     Left eye: 2.5% Phenylephrine, 1.0% Mydriacyl @ 8:40 AM           Slit Lamp and Fundus Exam     External Exam       Right Left   External Normal, pitting edema of shins 1+ right Normal, bilateral lower extremity pitting edema 2+ left         Slit Lamp Exam       Right Left   Lids/Lashes Normal Normal   Conjunctiva/Sclera White and quiet White and quiet   Cornea Clear Clear   Anterior Chamber Deep and quiet Deep and quiet   Iris Round and reactive Round and reactive   Lens Posterior chamber intraocular lens, Open posterior capsule Posterior chamber intraocular lens, Open posterior capsule   Anterior Vitreous Normal Normal         Fundus Exam       Right Left   Posterior Vitreous  Posterior vitreous detachment   Disc  Normal   C/D Ratio  0.5   Macula  superior to the FAZ region, exudates, Microaneurysms, Macular thickening centrally, much less thickening   Vessels  Retinopathy, severe nonproliferative diabetic retinopathy with limited peripheral PRP room temporally and superiorly for more laser   Periphery  Peripheral retinal nonperfusion nasal and inferior room temporally for more laser if needed            IMAGING AND PROCEDURES  Imaging and Procedures for 11/07/21  OCT, Retina - OU - Both Eyes       Right Eye Quality was good. Scan locations included subfoveal. Central Foveal Thickness: 213. Progression has improved. Findings include abnormal foveal contour, cystoid macular edema.   Left Eye Quality was good. Scan locations included subfoveal. Central Foveal Thickness: 188. Progression has improved. Findings include abnormal foveal contour, cystoid macular edema, epiretinal membrane.   Notes    Macular thickening much improved OD.  Vastly  improved  OS also much improved superonasal to FAZ with center involved CSME thickness of continued stability and improvement in CSME superonasal to FAZ OS, repeat injection OS today at 5 weeks and extend interval  now to 8 weeks      Intravitreal Injection, Pharmacologic Agent - OS - Left Eye       Time Out 11/07/2021. 8:57 AM. Confirmed correct patient, procedure, site, and patient consented.   Anesthesia Topical anesthesia was used. Anesthetic medications included Lidocaine 4%.   Procedure Preparation included 5% betadine to ocular surface, 10% betadine to eyelids, Tobramycin 0.3%. A 30 gauge needle was used.   Injection: 2 mg aflibercept 2 MG/0.05ML   Route: Intravitreal, Site: Left Eye   NDC: [id number], Lot: [id number], Expiration date: 11/25/2022, Waste: 0 mL   Post-op Post injection exam found visual acuity of at least counting fingers. The patient tolerated the procedure well. There were no complications. The patient received written and verbal post procedure care education. Post injection medications included ocuflox.              ASSESSMENT/PLAN:  Severe nonproliferative diabetic retinopathy of left eye, with macular edema, associated with type 2 diabetes mellitus ([place]) OS doing very well.  Since recent exacerbation of CSME May 2023.  On Eylea.  We will repeat injection today and now extend interval examination next 8 weeks  Severe nonproliferative diabetic retinopathy of right eye, with macular edema, associated with type 2 diabetes mellitus ([place]) OD, follow-up as scheduled     ICD-10-CM   1. Severe nonproliferative diabetic retinopathy of left eye, with macular edema, associated with type 2 diabetes mellitus (HCC)  L84.5364 OCT, Retina - OU - Both Eyes    Intravitreal Injection, Pharmacologic Agent - OS - Left Eye    aflibercept (EYLEA) SOLN 2 mg    2. Severe nonproliferative diabetic retinopathy of right eye, with macular edema, associated with type 2  diabetes mellitus ([place])  E11.3411       1.  OU vastly improved severe NPDR and CSME now with improved acuity as compared to onset of exacerbation of CSME May 2023.  2.  OD follow-up as scheduled in roughly 7 weeks  3.  OS repeat injection today follow-up now in 8 weeks  Ophthalmic Meds Ordered this visit:  Meds ordered this encounter  Medications   aflibercept (EYLEA) SOLN 2 mg       Return in about 8 weeks (around 01/02/2022) for dilate, OS, EYLEA OCT.  There are no Patient Instructions on file for this visit.   Explained the diagnoses, plan, and follow up with the patient and they expressed understanding.  Patient expressed understanding of the importance of proper follow up care.   Clent Demark Ivon Oelkers M.D. Diseases & Surgery of the Retina and Vitreous Retina & Diabetic [place] 11/07/21     Abbreviations: M myopia (nearsighted); A astigmatism; H hyperopia (farsighted); P presbyopia; Mrx spectacle prescription;  CTL contact lenses; OD right eye; OS left eye; OU both eyes  XT exotropia; ET esotropia; PEK punctate epithelial keratitis; PEE punctate epithelial erosions; DES dry eye syndrome; MGD meibomian gland dysfunction; ATs artificial tears; PFAT's preservative free artificial tears; [place] nuclear sclerotic cataract; PSC posterior subcapsular cataract; ERM epi-retinal membrane; PVD posterior vitreous detachment; RD retinal detachment; DM diabetes mellitus; DR diabetic retinopathy; NPDR non-proliferative diabetic retinopathy; PDR proliferative diabetic retinopathy; CSME clinically significant macular edema; DME diabetic macular edema; dbh dot blot hemorrhages; CWS cotton wool spot; POAG primary open angle glaucoma; C/D cup-to-disc ratio; HVF humphrey visual field; GVF goldmann visual field; OCT optical coherence tomography; IOP intraocular pressure; BRVO Branch retinal vein occlusion; CRVO central retinal vein occlusion; CRAO central retinal artery occlusion; BRAO branch retinal artery  occlusion;  RT retinal tear; SB scleral buckle; PPV pars plana vitrectomy; VH Vitreous hemorrhage; PRP panretinal laser photocoagulation; IVK intravitreal kenalog; VMT vitreomacular traction; MH Macular hole;  NVD neovascularization of the disc; NVE neovascularization elsewhere; AREDS age related eye disease study; ARMD age related macular degeneration; POAG primary open angle glaucoma; EBMD epithelial/anterior basement membrane dystrophy; ACIOL anterior chamber intraocular lens; IOL intraocular lens; PCIOL posterior chamber intraocular lens; Phaco/IOL phacoemulsification with intraocular lens placement; PRK photorefractive keratectomy; LASIK laser assisted in situ keratomileusis; HTN hypertension; DM diabetes mellitus; COPD chronic obstructive pulmonary disease 

## 2021-11-07 NOTE — Assessment & Plan Note (Signed)
OD, follow-up as scheduled

## 2021-11-11 DIAGNOSIS — L4050 Arthropathic psoriasis, unspecified: Secondary | ICD-10-CM | POA: Diagnosis not present

## 2021-11-14 DIAGNOSIS — Z79899 Other long term (current) drug therapy: Secondary | ICD-10-CM | POA: Diagnosis not present

## 2021-11-14 DIAGNOSIS — D696 Thrombocytopenia, unspecified: Secondary | ICD-10-CM | POA: Diagnosis not present

## 2021-11-14 DIAGNOSIS — M150 Primary generalized (osteo)arthritis: Secondary | ICD-10-CM | POA: Diagnosis not present

## 2021-11-14 DIAGNOSIS — M545 Low back pain, unspecified: Secondary | ICD-10-CM | POA: Diagnosis not present

## 2021-11-14 DIAGNOSIS — L4050 Arthropathic psoriasis, unspecified: Secondary | ICD-10-CM | POA: Diagnosis not present

## 2021-11-14 DIAGNOSIS — M810 Age-related osteoporosis without current pathological fracture: Secondary | ICD-10-CM | POA: Diagnosis not present

## 2021-11-14 DIAGNOSIS — M549 Dorsalgia, unspecified: Secondary | ICD-10-CM | POA: Diagnosis not present

## 2021-12-11 DIAGNOSIS — N182 Chronic kidney disease, stage 2 (mild): Secondary | ICD-10-CM | POA: Diagnosis not present

## 2021-12-11 DIAGNOSIS — I700 Atherosclerosis of aorta: Secondary | ICD-10-CM | POA: Diagnosis not present

## 2021-12-11 DIAGNOSIS — L4050 Arthropathic psoriasis, unspecified: Secondary | ICD-10-CM | POA: Diagnosis not present

## 2021-12-11 DIAGNOSIS — R5383 Other fatigue: Secondary | ICD-10-CM | POA: Diagnosis not present

## 2021-12-11 DIAGNOSIS — Z0000 Encounter for general adult medical examination without abnormal findings: Secondary | ICD-10-CM | POA: Diagnosis not present

## 2021-12-11 DIAGNOSIS — E782 Mixed hyperlipidemia: Secondary | ICD-10-CM | POA: Diagnosis not present

## 2021-12-11 DIAGNOSIS — I10 Essential (primary) hypertension: Secondary | ICD-10-CM | POA: Diagnosis not present

## 2021-12-11 DIAGNOSIS — E113413 Type 2 diabetes mellitus with severe nonproliferative diabetic retinopathy with macular edema, bilateral: Secondary | ICD-10-CM | POA: Diagnosis not present

## 2021-12-11 DIAGNOSIS — Z23 Encounter for immunization: Secondary | ICD-10-CM | POA: Diagnosis not present

## 2021-12-24 DIAGNOSIS — E113413 Type 2 diabetes mellitus with severe nonproliferative diabetic retinopathy with macular edema, bilateral: Secondary | ICD-10-CM | POA: Diagnosis not present

## 2021-12-24 DIAGNOSIS — Z794 Long term (current) use of insulin: Secondary | ICD-10-CM | POA: Diagnosis not present

## 2021-12-24 DIAGNOSIS — E1140 Type 2 diabetes mellitus with diabetic neuropathy, unspecified: Secondary | ICD-10-CM | POA: Diagnosis not present

## 2021-12-24 DIAGNOSIS — Z0000 Encounter for general adult medical examination without abnormal findings: Secondary | ICD-10-CM | POA: Diagnosis not present

## 2021-12-24 DIAGNOSIS — E782 Mixed hyperlipidemia: Secondary | ICD-10-CM | POA: Diagnosis not present

## 2021-12-24 DIAGNOSIS — M810 Age-related osteoporosis without current pathological fracture: Secondary | ICD-10-CM | POA: Diagnosis not present

## 2021-12-24 DIAGNOSIS — L4050 Arthropathic psoriasis, unspecified: Secondary | ICD-10-CM | POA: Diagnosis not present

## 2021-12-24 DIAGNOSIS — I10 Essential (primary) hypertension: Secondary | ICD-10-CM | POA: Diagnosis not present

## 2021-12-24 DIAGNOSIS — I700 Atherosclerosis of aorta: Secondary | ICD-10-CM | POA: Diagnosis not present

## 2021-12-24 DIAGNOSIS — Z23 Encounter for immunization: Secondary | ICD-10-CM | POA: Diagnosis not present

## 2021-12-26 ENCOUNTER — Encounter (INDEPENDENT_AMBULATORY_CARE_PROVIDER_SITE_OTHER): Payer: Medicare Other | Admitting: Ophthalmology

## 2021-12-26 DIAGNOSIS — H35031 Hypertensive retinopathy, right eye: Secondary | ICD-10-CM | POA: Diagnosis not present

## 2021-12-26 DIAGNOSIS — E113411 Type 2 diabetes mellitus with severe nonproliferative diabetic retinopathy with macular edema, right eye: Secondary | ICD-10-CM | POA: Diagnosis not present

## 2021-12-26 DIAGNOSIS — E113412 Type 2 diabetes mellitus with severe nonproliferative diabetic retinopathy with macular edema, left eye: Secondary | ICD-10-CM | POA: Diagnosis not present

## 2022-01-02 ENCOUNTER — Encounter (INDEPENDENT_AMBULATORY_CARE_PROVIDER_SITE_OTHER): Payer: Medicare Other | Admitting: Ophthalmology

## 2022-01-02 DIAGNOSIS — E113412 Type 2 diabetes mellitus with severe nonproliferative diabetic retinopathy with macular edema, left eye: Secondary | ICD-10-CM | POA: Diagnosis not present

## 2022-01-02 DIAGNOSIS — E113411 Type 2 diabetes mellitus with severe nonproliferative diabetic retinopathy with macular edema, right eye: Secondary | ICD-10-CM | POA: Diagnosis not present

## 2022-01-02 DIAGNOSIS — H35372 Puckering of macula, left eye: Secondary | ICD-10-CM | POA: Diagnosis not present

## 2022-01-02 DIAGNOSIS — H35031 Hypertensive retinopathy, right eye: Secondary | ICD-10-CM | POA: Diagnosis not present

## 2022-01-08 DIAGNOSIS — L4050 Arthropathic psoriasis, unspecified: Secondary | ICD-10-CM | POA: Diagnosis not present

## 2022-01-14 DIAGNOSIS — I10 Essential (primary) hypertension: Secondary | ICD-10-CM | POA: Diagnosis not present

## 2022-01-14 DIAGNOSIS — E118 Type 2 diabetes mellitus with unspecified complications: Secondary | ICD-10-CM | POA: Diagnosis not present

## 2022-01-14 DIAGNOSIS — E1140 Type 2 diabetes mellitus with diabetic neuropathy, unspecified: Secondary | ICD-10-CM | POA: Diagnosis not present

## 2022-01-14 DIAGNOSIS — Z9641 Presence of insulin pump (external) (internal): Secondary | ICD-10-CM | POA: Diagnosis not present

## 2022-01-14 DIAGNOSIS — E782 Mixed hyperlipidemia: Secondary | ICD-10-CM | POA: Diagnosis not present

## 2022-01-23 DIAGNOSIS — E113411 Type 2 diabetes mellitus with severe nonproliferative diabetic retinopathy with macular edema, right eye: Secondary | ICD-10-CM | POA: Diagnosis not present

## 2022-01-23 DIAGNOSIS — E113412 Type 2 diabetes mellitus with severe nonproliferative diabetic retinopathy with macular edema, left eye: Secondary | ICD-10-CM | POA: Diagnosis not present

## 2022-02-05 DIAGNOSIS — L4050 Arthropathic psoriasis, unspecified: Secondary | ICD-10-CM | POA: Diagnosis not present

## 2022-02-13 DIAGNOSIS — E113412 Type 2 diabetes mellitus with severe nonproliferative diabetic retinopathy with macular edema, left eye: Secondary | ICD-10-CM | POA: Diagnosis not present

## 2022-02-20 DIAGNOSIS — H35031 Hypertensive retinopathy, right eye: Secondary | ICD-10-CM | POA: Diagnosis not present

## 2022-02-20 DIAGNOSIS — E113411 Type 2 diabetes mellitus with severe nonproliferative diabetic retinopathy with macular edema, right eye: Secondary | ICD-10-CM | POA: Diagnosis not present

## 2022-02-26 ENCOUNTER — Other Ambulatory Visit (HOSPITAL_COMMUNITY): Payer: Self-pay

## 2022-02-26 MED ORDER — FIASP 100 UNIT/ML IJ SOLN
100.0000 [IU] | Freq: Every day | INTRAMUSCULAR | 6 refills | Status: DC
  Filled 2022-02-26: qty 30, 30d supply, fill #0

## 2022-02-27 ENCOUNTER — Other Ambulatory Visit (HOSPITAL_COMMUNITY): Payer: Self-pay

## 2022-03-05 ENCOUNTER — Other Ambulatory Visit (HOSPITAL_COMMUNITY): Payer: Self-pay

## 2022-03-05 DIAGNOSIS — M810 Age-related osteoporosis without current pathological fracture: Secondary | ICD-10-CM | POA: Diagnosis not present

## 2022-03-05 DIAGNOSIS — M150 Primary generalized (osteo)arthritis: Secondary | ICD-10-CM | POA: Diagnosis not present

## 2022-03-05 DIAGNOSIS — M545 Low back pain, unspecified: Secondary | ICD-10-CM | POA: Diagnosis not present

## 2022-03-05 DIAGNOSIS — Z79899 Other long term (current) drug therapy: Secondary | ICD-10-CM | POA: Diagnosis not present

## 2022-03-05 DIAGNOSIS — L4050 Arthropathic psoriasis, unspecified: Secondary | ICD-10-CM | POA: Diagnosis not present

## 2022-03-05 DIAGNOSIS — D696 Thrombocytopenia, unspecified: Secondary | ICD-10-CM | POA: Diagnosis not present

## 2022-03-24 DIAGNOSIS — M810 Age-related osteoporosis without current pathological fracture: Secondary | ICD-10-CM | POA: Diagnosis not present

## 2022-03-24 DIAGNOSIS — L4050 Arthropathic psoriasis, unspecified: Secondary | ICD-10-CM | POA: Diagnosis not present

## 2022-04-01 ENCOUNTER — Other Ambulatory Visit (HOSPITAL_COMMUNITY): Payer: Self-pay

## 2022-04-02 DIAGNOSIS — L4050 Arthropathic psoriasis, unspecified: Secondary | ICD-10-CM | POA: Diagnosis not present

## 2022-04-10 DIAGNOSIS — H35372 Puckering of macula, left eye: Secondary | ICD-10-CM | POA: Diagnosis not present

## 2022-04-10 DIAGNOSIS — H35031 Hypertensive retinopathy, right eye: Secondary | ICD-10-CM | POA: Diagnosis not present

## 2022-04-10 DIAGNOSIS — E113411 Type 2 diabetes mellitus with severe nonproliferative diabetic retinopathy with macular edema, right eye: Secondary | ICD-10-CM | POA: Diagnosis not present

## 2022-04-10 DIAGNOSIS — E113412 Type 2 diabetes mellitus with severe nonproliferative diabetic retinopathy with macular edema, left eye: Secondary | ICD-10-CM | POA: Diagnosis not present

## 2022-04-17 DIAGNOSIS — E113411 Type 2 diabetes mellitus with severe nonproliferative diabetic retinopathy with macular edema, right eye: Secondary | ICD-10-CM | POA: Diagnosis not present

## 2022-04-17 DIAGNOSIS — H35031 Hypertensive retinopathy, right eye: Secondary | ICD-10-CM | POA: Diagnosis not present

## 2022-04-18 DIAGNOSIS — J209 Acute bronchitis, unspecified: Secondary | ICD-10-CM | POA: Diagnosis not present

## 2022-04-18 DIAGNOSIS — R051 Acute cough: Secondary | ICD-10-CM | POA: Diagnosis not present

## 2022-04-18 DIAGNOSIS — R0981 Nasal congestion: Secondary | ICD-10-CM | POA: Diagnosis not present

## 2022-04-30 DIAGNOSIS — L4050 Arthropathic psoriasis, unspecified: Secondary | ICD-10-CM | POA: Diagnosis not present

## 2022-05-19 DIAGNOSIS — J45909 Unspecified asthma, uncomplicated: Secondary | ICD-10-CM | POA: Diagnosis not present

## 2022-05-19 DIAGNOSIS — M810 Age-related osteoporosis without current pathological fracture: Secondary | ICD-10-CM | POA: Diagnosis not present

## 2022-05-19 DIAGNOSIS — Z9641 Presence of insulin pump (external) (internal): Secondary | ICD-10-CM | POA: Diagnosis not present

## 2022-05-19 DIAGNOSIS — R059 Cough, unspecified: Secondary | ICD-10-CM | POA: Diagnosis not present

## 2022-05-19 DIAGNOSIS — E1140 Type 2 diabetes mellitus with diabetic neuropathy, unspecified: Secondary | ICD-10-CM | POA: Diagnosis not present

## 2022-05-19 DIAGNOSIS — E113413 Type 2 diabetes mellitus with severe nonproliferative diabetic retinopathy with macular edema, bilateral: Secondary | ICD-10-CM | POA: Diagnosis not present

## 2022-05-19 DIAGNOSIS — E559 Vitamin D deficiency, unspecified: Secondary | ICD-10-CM | POA: Diagnosis not present

## 2022-05-19 DIAGNOSIS — I10 Essential (primary) hypertension: Secondary | ICD-10-CM | POA: Diagnosis not present

## 2022-05-19 DIAGNOSIS — E11319 Type 2 diabetes mellitus with unspecified diabetic retinopathy without macular edema: Secondary | ICD-10-CM | POA: Diagnosis not present

## 2022-05-19 DIAGNOSIS — G9331 Postviral fatigue syndrome: Secondary | ICD-10-CM | POA: Diagnosis not present

## 2022-05-19 DIAGNOSIS — E118 Type 2 diabetes mellitus with unspecified complications: Secondary | ICD-10-CM | POA: Diagnosis not present

## 2022-05-19 DIAGNOSIS — E782 Mixed hyperlipidemia: Secondary | ICD-10-CM | POA: Diagnosis not present

## 2022-05-19 DIAGNOSIS — Z794 Long term (current) use of insulin: Secondary | ICD-10-CM | POA: Diagnosis not present

## 2022-05-19 DIAGNOSIS — R8289 Other abnormal findings on cytological and histological examination of urine: Secondary | ICD-10-CM | POA: Diagnosis not present

## 2022-05-28 DIAGNOSIS — L4050 Arthropathic psoriasis, unspecified: Secondary | ICD-10-CM | POA: Diagnosis not present

## 2022-06-05 DIAGNOSIS — E113412 Type 2 diabetes mellitus with severe nonproliferative diabetic retinopathy with macular edema, left eye: Secondary | ICD-10-CM | POA: Diagnosis not present

## 2022-06-05 DIAGNOSIS — H35031 Hypertensive retinopathy, right eye: Secondary | ICD-10-CM | POA: Diagnosis not present

## 2022-06-05 DIAGNOSIS — H35372 Puckering of macula, left eye: Secondary | ICD-10-CM | POA: Diagnosis not present

## 2022-06-05 DIAGNOSIS — E113411 Type 2 diabetes mellitus with severe nonproliferative diabetic retinopathy with macular edema, right eye: Secondary | ICD-10-CM | POA: Diagnosis not present

## 2022-06-12 DIAGNOSIS — E113412 Type 2 diabetes mellitus with severe nonproliferative diabetic retinopathy with macular edema, left eye: Secondary | ICD-10-CM | POA: Diagnosis not present

## 2022-06-12 DIAGNOSIS — H35372 Puckering of macula, left eye: Secondary | ICD-10-CM | POA: Diagnosis not present

## 2022-06-12 DIAGNOSIS — E113411 Type 2 diabetes mellitus with severe nonproliferative diabetic retinopathy with macular edema, right eye: Secondary | ICD-10-CM | POA: Diagnosis not present

## 2022-06-12 DIAGNOSIS — H35031 Hypertensive retinopathy, right eye: Secondary | ICD-10-CM | POA: Diagnosis not present

## 2022-06-25 DIAGNOSIS — I700 Atherosclerosis of aorta: Secondary | ICD-10-CM | POA: Diagnosis not present

## 2022-06-25 DIAGNOSIS — E782 Mixed hyperlipidemia: Secondary | ICD-10-CM | POA: Diagnosis not present

## 2022-06-25 DIAGNOSIS — E1140 Type 2 diabetes mellitus with diabetic neuropathy, unspecified: Secondary | ICD-10-CM | POA: Diagnosis not present

## 2022-06-25 DIAGNOSIS — Z794 Long term (current) use of insulin: Secondary | ICD-10-CM | POA: Diagnosis not present

## 2022-06-25 DIAGNOSIS — E113413 Type 2 diabetes mellitus with severe nonproliferative diabetic retinopathy with macular edema, bilateral: Secondary | ICD-10-CM | POA: Diagnosis not present

## 2022-06-25 DIAGNOSIS — I10 Essential (primary) hypertension: Secondary | ICD-10-CM | POA: Diagnosis not present

## 2022-06-25 DIAGNOSIS — L4050 Arthropathic psoriasis, unspecified: Secondary | ICD-10-CM | POA: Diagnosis not present

## 2022-06-25 DIAGNOSIS — M810 Age-related osteoporosis without current pathological fracture: Secondary | ICD-10-CM | POA: Diagnosis not present

## 2022-07-08 DIAGNOSIS — M810 Age-related osteoporosis without current pathological fracture: Secondary | ICD-10-CM | POA: Diagnosis not present

## 2022-07-08 DIAGNOSIS — Z23 Encounter for immunization: Secondary | ICD-10-CM | POA: Diagnosis not present

## 2022-07-08 DIAGNOSIS — L4050 Arthropathic psoriasis, unspecified: Secondary | ICD-10-CM | POA: Diagnosis not present

## 2022-07-08 DIAGNOSIS — E1140 Type 2 diabetes mellitus with diabetic neuropathy, unspecified: Secondary | ICD-10-CM | POA: Diagnosis not present

## 2022-07-08 DIAGNOSIS — I10 Essential (primary) hypertension: Secondary | ICD-10-CM | POA: Diagnosis not present

## 2022-07-08 DIAGNOSIS — E782 Mixed hyperlipidemia: Secondary | ICD-10-CM | POA: Diagnosis not present

## 2022-07-08 DIAGNOSIS — I700 Atherosclerosis of aorta: Secondary | ICD-10-CM | POA: Diagnosis not present

## 2022-07-08 DIAGNOSIS — E113413 Type 2 diabetes mellitus with severe nonproliferative diabetic retinopathy with macular edema, bilateral: Secondary | ICD-10-CM | POA: Diagnosis not present

## 2022-07-08 DIAGNOSIS — Z794 Long term (current) use of insulin: Secondary | ICD-10-CM | POA: Diagnosis not present

## 2022-07-23 DIAGNOSIS — L4050 Arthropathic psoriasis, unspecified: Secondary | ICD-10-CM | POA: Diagnosis not present

## 2022-08-14 DIAGNOSIS — E113411 Type 2 diabetes mellitus with severe nonproliferative diabetic retinopathy with macular edema, right eye: Secondary | ICD-10-CM | POA: Diagnosis not present

## 2022-08-14 DIAGNOSIS — H35372 Puckering of macula, left eye: Secondary | ICD-10-CM | POA: Diagnosis not present

## 2022-08-14 DIAGNOSIS — H35031 Hypertensive retinopathy, right eye: Secondary | ICD-10-CM | POA: Diagnosis not present

## 2022-08-14 DIAGNOSIS — Z961 Presence of intraocular lens: Secondary | ICD-10-CM | POA: Diagnosis not present

## 2022-08-14 DIAGNOSIS — E113412 Type 2 diabetes mellitus with severe nonproliferative diabetic retinopathy with macular edema, left eye: Secondary | ICD-10-CM | POA: Diagnosis not present

## 2022-08-18 DIAGNOSIS — H35031 Hypertensive retinopathy, right eye: Secondary | ICD-10-CM | POA: Diagnosis not present

## 2022-08-18 DIAGNOSIS — E113412 Type 2 diabetes mellitus with severe nonproliferative diabetic retinopathy with macular edema, left eye: Secondary | ICD-10-CM | POA: Diagnosis not present

## 2022-08-18 DIAGNOSIS — E113411 Type 2 diabetes mellitus with severe nonproliferative diabetic retinopathy with macular edema, right eye: Secondary | ICD-10-CM | POA: Diagnosis not present

## 2022-08-18 DIAGNOSIS — H359 Unspecified retinal disorder: Secondary | ICD-10-CM | POA: Diagnosis not present

## 2022-08-18 DIAGNOSIS — H35372 Puckering of macula, left eye: Secondary | ICD-10-CM | POA: Diagnosis not present

## 2022-08-20 DIAGNOSIS — L4050 Arthropathic psoriasis, unspecified: Secondary | ICD-10-CM | POA: Diagnosis not present

## 2022-08-20 DIAGNOSIS — D696 Thrombocytopenia, unspecified: Secondary | ICD-10-CM | POA: Diagnosis not present

## 2022-08-20 DIAGNOSIS — M810 Age-related osteoporosis without current pathological fracture: Secondary | ICD-10-CM | POA: Diagnosis not present

## 2022-08-20 DIAGNOSIS — M150 Primary generalized (osteo)arthritis: Secondary | ICD-10-CM | POA: Diagnosis not present

## 2022-08-20 DIAGNOSIS — Z79899 Other long term (current) drug therapy: Secondary | ICD-10-CM | POA: Diagnosis not present

## 2022-08-20 DIAGNOSIS — M545 Low back pain, unspecified: Secondary | ICD-10-CM | POA: Diagnosis not present

## 2022-08-26 DIAGNOSIS — E113412 Type 2 diabetes mellitus with severe nonproliferative diabetic retinopathy with macular edema, left eye: Secondary | ICD-10-CM | POA: Diagnosis not present

## 2022-09-17 DIAGNOSIS — M79671 Pain in right foot: Secondary | ICD-10-CM | POA: Diagnosis not present

## 2022-09-17 DIAGNOSIS — M79672 Pain in left foot: Secondary | ICD-10-CM | POA: Diagnosis not present

## 2022-09-17 DIAGNOSIS — M25572 Pain in left ankle and joints of left foot: Secondary | ICD-10-CM | POA: Diagnosis not present

## 2022-09-17 DIAGNOSIS — M25571 Pain in right ankle and joints of right foot: Secondary | ICD-10-CM | POA: Diagnosis not present

## 2022-09-17 DIAGNOSIS — L4050 Arthropathic psoriasis, unspecified: Secondary | ICD-10-CM | POA: Diagnosis not present

## 2022-09-17 DIAGNOSIS — M545 Low back pain, unspecified: Secondary | ICD-10-CM | POA: Diagnosis not present

## 2022-09-23 DIAGNOSIS — M810 Age-related osteoporosis without current pathological fracture: Secondary | ICD-10-CM | POA: Diagnosis not present

## 2022-10-15 DIAGNOSIS — L4050 Arthropathic psoriasis, unspecified: Secondary | ICD-10-CM | POA: Diagnosis not present

## 2022-10-23 DIAGNOSIS — E113411 Type 2 diabetes mellitus with severe nonproliferative diabetic retinopathy with macular edema, right eye: Secondary | ICD-10-CM | POA: Diagnosis not present

## 2022-10-23 DIAGNOSIS — H359 Unspecified retinal disorder: Secondary | ICD-10-CM | POA: Diagnosis not present

## 2022-10-23 DIAGNOSIS — H35372 Puckering of macula, left eye: Secondary | ICD-10-CM | POA: Diagnosis not present

## 2022-10-23 DIAGNOSIS — E113412 Type 2 diabetes mellitus with severe nonproliferative diabetic retinopathy with macular edema, left eye: Secondary | ICD-10-CM | POA: Diagnosis not present

## 2022-10-23 DIAGNOSIS — H35031 Hypertensive retinopathy, right eye: Secondary | ICD-10-CM | POA: Diagnosis not present

## 2022-11-06 DIAGNOSIS — H359 Unspecified retinal disorder: Secondary | ICD-10-CM | POA: Diagnosis not present

## 2022-11-06 DIAGNOSIS — E113411 Type 2 diabetes mellitus with severe nonproliferative diabetic retinopathy with macular edema, right eye: Secondary | ICD-10-CM | POA: Diagnosis not present

## 2022-11-06 DIAGNOSIS — H35372 Puckering of macula, left eye: Secondary | ICD-10-CM | POA: Diagnosis not present

## 2022-11-06 DIAGNOSIS — E113412 Type 2 diabetes mellitus with severe nonproliferative diabetic retinopathy with macular edema, left eye: Secondary | ICD-10-CM | POA: Diagnosis not present

## 2022-11-06 DIAGNOSIS — H35031 Hypertensive retinopathy, right eye: Secondary | ICD-10-CM | POA: Diagnosis not present

## 2022-11-12 DIAGNOSIS — I10 Essential (primary) hypertension: Secondary | ICD-10-CM | POA: Diagnosis not present

## 2022-11-12 DIAGNOSIS — E118 Type 2 diabetes mellitus with unspecified complications: Secondary | ICD-10-CM | POA: Diagnosis not present

## 2022-11-12 DIAGNOSIS — I700 Atherosclerosis of aorta: Secondary | ICD-10-CM | POA: Diagnosis not present

## 2022-11-12 DIAGNOSIS — L4050 Arthropathic psoriasis, unspecified: Secondary | ICD-10-CM | POA: Diagnosis not present

## 2022-11-12 DIAGNOSIS — D696 Thrombocytopenia, unspecified: Secondary | ICD-10-CM | POA: Diagnosis not present

## 2022-11-12 DIAGNOSIS — E782 Mixed hyperlipidemia: Secondary | ICD-10-CM | POA: Diagnosis not present

## 2022-11-12 DIAGNOSIS — R5383 Other fatigue: Secondary | ICD-10-CM | POA: Diagnosis not present

## 2022-11-13 DIAGNOSIS — M150 Primary generalized (osteo)arthritis: Secondary | ICD-10-CM | POA: Diagnosis not present

## 2022-11-13 DIAGNOSIS — L4050 Arthropathic psoriasis, unspecified: Secondary | ICD-10-CM | POA: Diagnosis not present

## 2022-11-13 DIAGNOSIS — D696 Thrombocytopenia, unspecified: Secondary | ICD-10-CM | POA: Diagnosis not present

## 2022-11-13 DIAGNOSIS — M810 Age-related osteoporosis without current pathological fracture: Secondary | ICD-10-CM | POA: Diagnosis not present

## 2022-11-13 DIAGNOSIS — M545 Low back pain, unspecified: Secondary | ICD-10-CM | POA: Diagnosis not present

## 2022-11-13 DIAGNOSIS — Z23 Encounter for immunization: Secondary | ICD-10-CM | POA: Diagnosis not present

## 2022-11-13 DIAGNOSIS — Z79899 Other long term (current) drug therapy: Secondary | ICD-10-CM | POA: Diagnosis not present

## 2022-11-20 DIAGNOSIS — M79672 Pain in left foot: Secondary | ICD-10-CM | POA: Diagnosis not present

## 2022-11-28 DIAGNOSIS — I10 Essential (primary) hypertension: Secondary | ICD-10-CM | POA: Diagnosis not present

## 2022-11-28 DIAGNOSIS — E1165 Type 2 diabetes mellitus with hyperglycemia: Secondary | ICD-10-CM | POA: Diagnosis not present

## 2022-11-28 DIAGNOSIS — E782 Mixed hyperlipidemia: Secondary | ICD-10-CM | POA: Diagnosis not present

## 2022-11-28 DIAGNOSIS — Z9641 Presence of insulin pump (external) (internal): Secondary | ICD-10-CM | POA: Diagnosis not present

## 2022-11-28 DIAGNOSIS — Z794 Long term (current) use of insulin: Secondary | ICD-10-CM | POA: Diagnosis not present

## 2022-11-28 DIAGNOSIS — E118 Type 2 diabetes mellitus with unspecified complications: Secondary | ICD-10-CM | POA: Diagnosis not present

## 2022-11-28 DIAGNOSIS — M810 Age-related osteoporosis without current pathological fracture: Secondary | ICD-10-CM | POA: Diagnosis not present

## 2022-12-10 DIAGNOSIS — L4050 Arthropathic psoriasis, unspecified: Secondary | ICD-10-CM | POA: Diagnosis not present

## 2022-12-10 DIAGNOSIS — M5416 Radiculopathy, lumbar region: Secondary | ICD-10-CM | POA: Diagnosis not present

## 2022-12-18 DIAGNOSIS — H359 Unspecified retinal disorder: Secondary | ICD-10-CM | POA: Diagnosis not present

## 2022-12-18 DIAGNOSIS — H35372 Puckering of macula, left eye: Secondary | ICD-10-CM | POA: Diagnosis not present

## 2022-12-18 DIAGNOSIS — E113411 Type 2 diabetes mellitus with severe nonproliferative diabetic retinopathy with macular edema, right eye: Secondary | ICD-10-CM | POA: Diagnosis not present

## 2022-12-18 DIAGNOSIS — E113412 Type 2 diabetes mellitus with severe nonproliferative diabetic retinopathy with macular edema, left eye: Secondary | ICD-10-CM | POA: Diagnosis not present

## 2022-12-18 DIAGNOSIS — H35031 Hypertensive retinopathy, right eye: Secondary | ICD-10-CM | POA: Diagnosis not present

## 2022-12-22 ENCOUNTER — Other Ambulatory Visit (HOSPITAL_COMMUNITY): Payer: Self-pay

## 2022-12-22 MED ORDER — FIASP 100 UNIT/ML IJ SOLN
100.0000 [IU] | Freq: Every day | INTRAMUSCULAR | 5 refills | Status: DC
  Filled 2022-12-22 (×3): qty 30, 30d supply, fill #0
  Filled 2023-01-17: qty 30, 30d supply, fill #1
  Filled 2023-01-28 – 2023-02-16 (×2): qty 30, 30d supply, fill #2
  Filled 2023-03-18: qty 30, 30d supply, fill #3
  Filled 2023-04-16: qty 30, 30d supply, fill #4
  Filled 2023-05-16: qty 30, 30d supply, fill #5

## 2022-12-23 ENCOUNTER — Other Ambulatory Visit (HOSPITAL_COMMUNITY): Payer: Self-pay

## 2022-12-23 ENCOUNTER — Other Ambulatory Visit: Payer: Self-pay

## 2022-12-23 ENCOUNTER — Encounter (HOSPITAL_COMMUNITY): Payer: Self-pay | Admitting: Pharmacist

## 2022-12-24 ENCOUNTER — Other Ambulatory Visit: Payer: Self-pay

## 2022-12-25 ENCOUNTER — Other Ambulatory Visit (HOSPITAL_COMMUNITY): Payer: Self-pay

## 2022-12-30 DIAGNOSIS — I10 Essential (primary) hypertension: Secondary | ICD-10-CM | POA: Diagnosis not present

## 2022-12-30 DIAGNOSIS — Z23 Encounter for immunization: Secondary | ICD-10-CM | POA: Diagnosis not present

## 2022-12-30 DIAGNOSIS — E113413 Type 2 diabetes mellitus with severe nonproliferative diabetic retinopathy with macular edema, bilateral: Secondary | ICD-10-CM | POA: Diagnosis not present

## 2022-12-30 DIAGNOSIS — I700 Atherosclerosis of aorta: Secondary | ICD-10-CM | POA: Diagnosis not present

## 2022-12-30 DIAGNOSIS — N182 Chronic kidney disease, stage 2 (mild): Secondary | ICD-10-CM | POA: Diagnosis not present

## 2022-12-30 DIAGNOSIS — Z794 Long term (current) use of insulin: Secondary | ICD-10-CM | POA: Diagnosis not present

## 2022-12-30 DIAGNOSIS — E782 Mixed hyperlipidemia: Secondary | ICD-10-CM | POA: Diagnosis not present

## 2022-12-30 DIAGNOSIS — E1140 Type 2 diabetes mellitus with diabetic neuropathy, unspecified: Secondary | ICD-10-CM | POA: Diagnosis not present

## 2022-12-30 DIAGNOSIS — Z0000 Encounter for general adult medical examination without abnormal findings: Secondary | ICD-10-CM | POA: Diagnosis not present

## 2022-12-30 DIAGNOSIS — L4050 Arthropathic psoriasis, unspecified: Secondary | ICD-10-CM | POA: Diagnosis not present

## 2022-12-30 DIAGNOSIS — M810 Age-related osteoporosis without current pathological fracture: Secondary | ICD-10-CM | POA: Diagnosis not present

## 2023-01-01 DIAGNOSIS — E113412 Type 2 diabetes mellitus with severe nonproliferative diabetic retinopathy with macular edema, left eye: Secondary | ICD-10-CM | POA: Diagnosis not present

## 2023-01-01 DIAGNOSIS — M5416 Radiculopathy, lumbar region: Secondary | ICD-10-CM | POA: Diagnosis not present

## 2023-01-01 DIAGNOSIS — E113411 Type 2 diabetes mellitus with severe nonproliferative diabetic retinopathy with macular edema, right eye: Secondary | ICD-10-CM | POA: Diagnosis not present

## 2023-01-02 DIAGNOSIS — E1140 Type 2 diabetes mellitus with diabetic neuropathy, unspecified: Secondary | ICD-10-CM | POA: Diagnosis not present

## 2023-01-07 DIAGNOSIS — L4050 Arthropathic psoriasis, unspecified: Secondary | ICD-10-CM | POA: Diagnosis not present

## 2023-01-08 DIAGNOSIS — E113412 Type 2 diabetes mellitus with severe nonproliferative diabetic retinopathy with macular edema, left eye: Secondary | ICD-10-CM | POA: Diagnosis not present

## 2023-01-08 DIAGNOSIS — H35031 Hypertensive retinopathy, right eye: Secondary | ICD-10-CM | POA: Diagnosis not present

## 2023-01-08 DIAGNOSIS — H35372 Puckering of macula, left eye: Secondary | ICD-10-CM | POA: Diagnosis not present

## 2023-01-19 ENCOUNTER — Other Ambulatory Visit: Payer: Self-pay

## 2023-01-19 ENCOUNTER — Encounter: Payer: Self-pay | Admitting: Pharmacist

## 2023-01-28 ENCOUNTER — Other Ambulatory Visit: Payer: Self-pay

## 2023-01-28 ENCOUNTER — Other Ambulatory Visit (HOSPITAL_COMMUNITY): Payer: Self-pay

## 2023-01-28 DIAGNOSIS — I10 Essential (primary) hypertension: Secondary | ICD-10-CM | POA: Diagnosis not present

## 2023-01-28 DIAGNOSIS — E1140 Type 2 diabetes mellitus with diabetic neuropathy, unspecified: Secondary | ICD-10-CM | POA: Diagnosis not present

## 2023-01-28 DIAGNOSIS — M810 Age-related osteoporosis without current pathological fracture: Secondary | ICD-10-CM | POA: Diagnosis not present

## 2023-01-28 DIAGNOSIS — E118 Type 2 diabetes mellitus with unspecified complications: Secondary | ICD-10-CM | POA: Diagnosis not present

## 2023-01-28 DIAGNOSIS — Z9641 Presence of insulin pump (external) (internal): Secondary | ICD-10-CM | POA: Diagnosis not present

## 2023-01-28 DIAGNOSIS — E782 Mixed hyperlipidemia: Secondary | ICD-10-CM | POA: Diagnosis not present

## 2023-01-28 MED ORDER — OMNIPOD 5 DEXG7G6 PODS GEN 5 MISC
5 refills | Status: DC
  Filled 2023-01-28 (×2): qty 45, 90d supply, fill #0
  Filled 2023-04-21: qty 15, 30d supply, fill #1
  Filled 2023-04-28: qty 25, 25d supply, fill #1
  Filled 2023-04-28: qty 15, 30d supply, fill #1

## 2023-01-28 MED ORDER — DEXCOM G7 SENSOR MISC
0 refills | Status: DC
  Filled 2023-01-28 – 2023-01-29 (×2): qty 9, 90d supply, fill #0

## 2023-01-29 ENCOUNTER — Other Ambulatory Visit: Payer: Self-pay

## 2023-02-04 DIAGNOSIS — L4050 Arthropathic psoriasis, unspecified: Secondary | ICD-10-CM | POA: Diagnosis not present

## 2023-02-09 DIAGNOSIS — E1140 Type 2 diabetes mellitus with diabetic neuropathy, unspecified: Secondary | ICD-10-CM | POA: Diagnosis not present

## 2023-03-05 DIAGNOSIS — L4050 Arthropathic psoriasis, unspecified: Secondary | ICD-10-CM | POA: Diagnosis not present

## 2023-03-06 ENCOUNTER — Other Ambulatory Visit: Payer: Self-pay

## 2023-03-08 DIAGNOSIS — E1165 Type 2 diabetes mellitus with hyperglycemia: Secondary | ICD-10-CM | POA: Diagnosis not present

## 2023-03-11 DIAGNOSIS — H35031 Hypertensive retinopathy, right eye: Secondary | ICD-10-CM | POA: Diagnosis not present

## 2023-03-11 DIAGNOSIS — E113411 Type 2 diabetes mellitus with severe nonproliferative diabetic retinopathy with macular edema, right eye: Secondary | ICD-10-CM | POA: Diagnosis not present

## 2023-03-11 DIAGNOSIS — E113412 Type 2 diabetes mellitus with severe nonproliferative diabetic retinopathy with macular edema, left eye: Secondary | ICD-10-CM | POA: Diagnosis not present

## 2023-03-11 DIAGNOSIS — H35372 Puckering of macula, left eye: Secondary | ICD-10-CM | POA: Diagnosis not present

## 2023-03-18 ENCOUNTER — Other Ambulatory Visit (HOSPITAL_COMMUNITY): Payer: Self-pay

## 2023-03-19 ENCOUNTER — Other Ambulatory Visit: Payer: Self-pay

## 2023-03-20 ENCOUNTER — Other Ambulatory Visit: Payer: Self-pay

## 2023-03-20 ENCOUNTER — Other Ambulatory Visit (HOSPITAL_COMMUNITY): Payer: Self-pay

## 2023-03-23 ENCOUNTER — Other Ambulatory Visit: Payer: Self-pay

## 2023-04-02 DIAGNOSIS — L4050 Arthropathic psoriasis, unspecified: Secondary | ICD-10-CM | POA: Diagnosis not present

## 2023-04-15 DIAGNOSIS — J069 Acute upper respiratory infection, unspecified: Secondary | ICD-10-CM | POA: Diagnosis not present

## 2023-04-15 DIAGNOSIS — R509 Fever, unspecified: Secondary | ICD-10-CM | POA: Diagnosis not present

## 2023-04-15 DIAGNOSIS — R051 Acute cough: Secondary | ICD-10-CM | POA: Diagnosis not present

## 2023-04-15 DIAGNOSIS — R0981 Nasal congestion: Secondary | ICD-10-CM | POA: Diagnosis not present

## 2023-04-21 ENCOUNTER — Encounter (HOSPITAL_COMMUNITY): Payer: Self-pay

## 2023-04-21 ENCOUNTER — Other Ambulatory Visit: Payer: Self-pay

## 2023-04-21 ENCOUNTER — Other Ambulatory Visit (HOSPITAL_COMMUNITY): Payer: Self-pay

## 2023-04-21 DIAGNOSIS — E118 Type 2 diabetes mellitus with unspecified complications: Secondary | ICD-10-CM | POA: Diagnosis not present

## 2023-04-21 DIAGNOSIS — E782 Mixed hyperlipidemia: Secondary | ICD-10-CM | POA: Diagnosis not present

## 2023-04-22 ENCOUNTER — Other Ambulatory Visit: Payer: Self-pay

## 2023-04-22 ENCOUNTER — Other Ambulatory Visit (HOSPITAL_COMMUNITY): Payer: Self-pay

## 2023-04-22 DIAGNOSIS — E113412 Type 2 diabetes mellitus with severe nonproliferative diabetic retinopathy with macular edema, left eye: Secondary | ICD-10-CM | POA: Diagnosis not present

## 2023-04-22 DIAGNOSIS — H35031 Hypertensive retinopathy, right eye: Secondary | ICD-10-CM | POA: Diagnosis not present

## 2023-04-22 DIAGNOSIS — E113411 Type 2 diabetes mellitus with severe nonproliferative diabetic retinopathy with macular edema, right eye: Secondary | ICD-10-CM | POA: Diagnosis not present

## 2023-04-22 DIAGNOSIS — H35372 Puckering of macula, left eye: Secondary | ICD-10-CM | POA: Diagnosis not present

## 2023-04-22 DIAGNOSIS — H359 Unspecified retinal disorder: Secondary | ICD-10-CM | POA: Diagnosis not present

## 2023-04-28 ENCOUNTER — Other Ambulatory Visit (HOSPITAL_COMMUNITY): Payer: Self-pay

## 2023-04-28 DIAGNOSIS — Z9641 Presence of insulin pump (external) (internal): Secondary | ICD-10-CM | POA: Diagnosis not present

## 2023-04-28 DIAGNOSIS — I10 Essential (primary) hypertension: Secondary | ICD-10-CM | POA: Diagnosis not present

## 2023-04-28 DIAGNOSIS — E118 Type 2 diabetes mellitus with unspecified complications: Secondary | ICD-10-CM | POA: Diagnosis not present

## 2023-04-28 DIAGNOSIS — E782 Mixed hyperlipidemia: Secondary | ICD-10-CM | POA: Diagnosis not present

## 2023-04-28 DIAGNOSIS — E1140 Type 2 diabetes mellitus with diabetic neuropathy, unspecified: Secondary | ICD-10-CM | POA: Diagnosis not present

## 2023-04-29 DIAGNOSIS — H35031 Hypertensive retinopathy, right eye: Secondary | ICD-10-CM | POA: Diagnosis not present

## 2023-04-29 DIAGNOSIS — E113411 Type 2 diabetes mellitus with severe nonproliferative diabetic retinopathy with macular edema, right eye: Secondary | ICD-10-CM | POA: Diagnosis not present

## 2023-04-29 DIAGNOSIS — E113412 Type 2 diabetes mellitus with severe nonproliferative diabetic retinopathy with macular edema, left eye: Secondary | ICD-10-CM | POA: Diagnosis not present

## 2023-04-29 DIAGNOSIS — H35372 Puckering of macula, left eye: Secondary | ICD-10-CM | POA: Diagnosis not present

## 2023-04-29 DIAGNOSIS — H359 Unspecified retinal disorder: Secondary | ICD-10-CM | POA: Diagnosis not present

## 2023-04-30 DIAGNOSIS — D696 Thrombocytopenia, unspecified: Secondary | ICD-10-CM | POA: Diagnosis not present

## 2023-04-30 DIAGNOSIS — Z79899 Other long term (current) drug therapy: Secondary | ICD-10-CM | POA: Diagnosis not present

## 2023-04-30 DIAGNOSIS — M810 Age-related osteoporosis without current pathological fracture: Secondary | ICD-10-CM | POA: Diagnosis not present

## 2023-04-30 DIAGNOSIS — M545 Low back pain, unspecified: Secondary | ICD-10-CM | POA: Diagnosis not present

## 2023-04-30 DIAGNOSIS — M150 Primary generalized (osteo)arthritis: Secondary | ICD-10-CM | POA: Diagnosis not present

## 2023-04-30 DIAGNOSIS — L4050 Arthropathic psoriasis, unspecified: Secondary | ICD-10-CM | POA: Diagnosis not present

## 2023-05-06 DIAGNOSIS — Z1231 Encounter for screening mammogram for malignant neoplasm of breast: Secondary | ICD-10-CM | POA: Diagnosis not present

## 2023-05-12 DIAGNOSIS — Z860100 Personal history of colon polyps, unspecified: Secondary | ICD-10-CM | POA: Diagnosis not present

## 2023-05-12 DIAGNOSIS — Z09 Encounter for follow-up examination after completed treatment for conditions other than malignant neoplasm: Secondary | ICD-10-CM | POA: Diagnosis not present

## 2023-05-12 DIAGNOSIS — D123 Benign neoplasm of transverse colon: Secondary | ICD-10-CM | POA: Diagnosis not present

## 2023-05-12 DIAGNOSIS — K5730 Diverticulosis of large intestine without perforation or abscess without bleeding: Secondary | ICD-10-CM | POA: Diagnosis not present

## 2023-05-14 DIAGNOSIS — D123 Benign neoplasm of transverse colon: Secondary | ICD-10-CM | POA: Diagnosis not present

## 2023-05-27 DIAGNOSIS — H35031 Hypertensive retinopathy, right eye: Secondary | ICD-10-CM | POA: Diagnosis not present

## 2023-05-27 DIAGNOSIS — E113412 Type 2 diabetes mellitus with severe nonproliferative diabetic retinopathy with macular edema, left eye: Secondary | ICD-10-CM | POA: Diagnosis not present

## 2023-05-27 DIAGNOSIS — E113411 Type 2 diabetes mellitus with severe nonproliferative diabetic retinopathy with macular edema, right eye: Secondary | ICD-10-CM | POA: Diagnosis not present

## 2023-05-27 DIAGNOSIS — H35372 Puckering of macula, left eye: Secondary | ICD-10-CM | POA: Diagnosis not present

## 2023-05-27 DIAGNOSIS — H35351 Cystoid macular degeneration, right eye: Secondary | ICD-10-CM | POA: Diagnosis not present

## 2023-05-27 DIAGNOSIS — H359 Unspecified retinal disorder: Secondary | ICD-10-CM | POA: Diagnosis not present

## 2023-05-28 DIAGNOSIS — E1140 Type 2 diabetes mellitus with diabetic neuropathy, unspecified: Secondary | ICD-10-CM | POA: Diagnosis not present

## 2023-05-28 DIAGNOSIS — L4050 Arthropathic psoriasis, unspecified: Secondary | ICD-10-CM | POA: Diagnosis not present

## 2023-05-28 DIAGNOSIS — E782 Mixed hyperlipidemia: Secondary | ICD-10-CM | POA: Diagnosis not present

## 2023-05-28 DIAGNOSIS — I10 Essential (primary) hypertension: Secondary | ICD-10-CM | POA: Diagnosis not present

## 2023-05-28 DIAGNOSIS — E118 Type 2 diabetes mellitus with unspecified complications: Secondary | ICD-10-CM | POA: Diagnosis not present

## 2023-06-10 DIAGNOSIS — H35351 Cystoid macular degeneration, right eye: Secondary | ICD-10-CM | POA: Diagnosis not present

## 2023-06-10 DIAGNOSIS — E113411 Type 2 diabetes mellitus with severe nonproliferative diabetic retinopathy with macular edema, right eye: Secondary | ICD-10-CM | POA: Diagnosis not present

## 2023-06-10 DIAGNOSIS — H35031 Hypertensive retinopathy, right eye: Secondary | ICD-10-CM | POA: Diagnosis not present

## 2023-06-10 DIAGNOSIS — H35372 Puckering of macula, left eye: Secondary | ICD-10-CM | POA: Diagnosis not present

## 2023-06-11 DIAGNOSIS — E1165 Type 2 diabetes mellitus with hyperglycemia: Secondary | ICD-10-CM | POA: Diagnosis not present

## 2023-06-29 DIAGNOSIS — L4050 Arthropathic psoriasis, unspecified: Secondary | ICD-10-CM | POA: Diagnosis not present

## 2023-07-01 DIAGNOSIS — H35351 Cystoid macular degeneration, right eye: Secondary | ICD-10-CM | POA: Diagnosis not present

## 2023-07-01 DIAGNOSIS — H359 Unspecified retinal disorder: Secondary | ICD-10-CM | POA: Diagnosis not present

## 2023-07-01 DIAGNOSIS — E113411 Type 2 diabetes mellitus with severe nonproliferative diabetic retinopathy with macular edema, right eye: Secondary | ICD-10-CM | POA: Diagnosis not present

## 2023-07-01 DIAGNOSIS — E113412 Type 2 diabetes mellitus with severe nonproliferative diabetic retinopathy with macular edema, left eye: Secondary | ICD-10-CM | POA: Diagnosis not present

## 2023-07-01 DIAGNOSIS — H35031 Hypertensive retinopathy, right eye: Secondary | ICD-10-CM | POA: Diagnosis not present

## 2023-07-01 DIAGNOSIS — H35372 Puckering of macula, left eye: Secondary | ICD-10-CM | POA: Diagnosis not present

## 2023-07-07 DIAGNOSIS — E113413 Type 2 diabetes mellitus with severe nonproliferative diabetic retinopathy with macular edema, bilateral: Secondary | ICD-10-CM | POA: Diagnosis not present

## 2023-07-07 DIAGNOSIS — E782 Mixed hyperlipidemia: Secondary | ICD-10-CM | POA: Diagnosis not present

## 2023-07-07 DIAGNOSIS — I10 Essential (primary) hypertension: Secondary | ICD-10-CM | POA: Diagnosis not present

## 2023-07-07 DIAGNOSIS — L4050 Arthropathic psoriasis, unspecified: Secondary | ICD-10-CM | POA: Diagnosis not present

## 2023-07-10 DIAGNOSIS — I10 Essential (primary) hypertension: Secondary | ICD-10-CM | POA: Diagnosis not present

## 2023-07-10 DIAGNOSIS — M810 Age-related osteoporosis without current pathological fracture: Secondary | ICD-10-CM | POA: Diagnosis not present

## 2023-07-10 DIAGNOSIS — Z4681 Encounter for fitting and adjustment of insulin pump: Secondary | ICD-10-CM | POA: Diagnosis not present

## 2023-07-10 DIAGNOSIS — Z9641 Presence of insulin pump (external) (internal): Secondary | ICD-10-CM | POA: Diagnosis not present

## 2023-07-10 DIAGNOSIS — E118 Type 2 diabetes mellitus with unspecified complications: Secondary | ICD-10-CM | POA: Diagnosis not present

## 2023-07-10 DIAGNOSIS — E782 Mixed hyperlipidemia: Secondary | ICD-10-CM | POA: Diagnosis not present

## 2023-07-14 DIAGNOSIS — M810 Age-related osteoporosis without current pathological fracture: Secondary | ICD-10-CM | POA: Diagnosis not present

## 2023-07-14 DIAGNOSIS — L4050 Arthropathic psoriasis, unspecified: Secondary | ICD-10-CM | POA: Diagnosis not present

## 2023-07-14 DIAGNOSIS — I10 Essential (primary) hypertension: Secondary | ICD-10-CM | POA: Diagnosis not present

## 2023-07-14 DIAGNOSIS — N182 Chronic kidney disease, stage 2 (mild): Secondary | ICD-10-CM | POA: Diagnosis not present

## 2023-07-14 DIAGNOSIS — E1140 Type 2 diabetes mellitus with diabetic neuropathy, unspecified: Secondary | ICD-10-CM | POA: Diagnosis not present

## 2023-07-14 DIAGNOSIS — E113413 Type 2 diabetes mellitus with severe nonproliferative diabetic retinopathy with macular edema, bilateral: Secondary | ICD-10-CM | POA: Diagnosis not present

## 2023-07-14 DIAGNOSIS — E782 Mixed hyperlipidemia: Secondary | ICD-10-CM | POA: Diagnosis not present

## 2023-07-14 DIAGNOSIS — Z794 Long term (current) use of insulin: Secondary | ICD-10-CM | POA: Diagnosis not present

## 2023-07-14 DIAGNOSIS — I700 Atherosclerosis of aorta: Secondary | ICD-10-CM | POA: Diagnosis not present

## 2023-07-26 ENCOUNTER — Other Ambulatory Visit (HOSPITAL_COMMUNITY): Payer: Self-pay

## 2023-07-27 ENCOUNTER — Other Ambulatory Visit (HOSPITAL_COMMUNITY): Payer: Self-pay

## 2023-07-27 DIAGNOSIS — L4050 Arthropathic psoriasis, unspecified: Secondary | ICD-10-CM | POA: Diagnosis not present

## 2023-07-28 ENCOUNTER — Other Ambulatory Visit (HOSPITAL_COMMUNITY): Payer: Self-pay

## 2023-07-28 ENCOUNTER — Other Ambulatory Visit: Payer: Self-pay

## 2023-07-28 MED ORDER — NOVOLOG 100 UNIT/ML IJ SOLN
INTRAMUSCULAR | 5 refills | Status: DC
  Filled 2023-07-28: qty 60, 30d supply, fill #0
  Filled 2023-08-21: qty 60, 30d supply, fill #1
  Filled 2023-09-21: qty 60, 30d supply, fill #2
  Filled 2023-10-20: qty 60, 30d supply, fill #3
  Filled 2023-11-15: qty 60, 30d supply, fill #4
  Filled 2023-12-13: qty 60, 30d supply, fill #5

## 2023-07-29 DIAGNOSIS — E113411 Type 2 diabetes mellitus with severe nonproliferative diabetic retinopathy with macular edema, right eye: Secondary | ICD-10-CM | POA: Diagnosis not present

## 2023-07-29 DIAGNOSIS — H359 Unspecified retinal disorder: Secondary | ICD-10-CM | POA: Diagnosis not present

## 2023-07-29 DIAGNOSIS — H35372 Puckering of macula, left eye: Secondary | ICD-10-CM | POA: Diagnosis not present

## 2023-07-29 DIAGNOSIS — E113412 Type 2 diabetes mellitus with severe nonproliferative diabetic retinopathy with macular edema, left eye: Secondary | ICD-10-CM | POA: Diagnosis not present

## 2023-07-29 DIAGNOSIS — H35031 Hypertensive retinopathy, right eye: Secondary | ICD-10-CM | POA: Diagnosis not present

## 2023-07-29 DIAGNOSIS — H35351 Cystoid macular degeneration, right eye: Secondary | ICD-10-CM | POA: Diagnosis not present

## 2023-07-30 ENCOUNTER — Other Ambulatory Visit (HOSPITAL_COMMUNITY): Payer: Self-pay

## 2023-07-30 MED ORDER — NOVOLOG 100 UNIT/ML IJ SOLN
200.0000 [IU] | Freq: Every day | INTRAMUSCULAR | 5 refills | Status: DC
  Filled 2023-07-30: qty 60, 30d supply, fill #0

## 2023-07-30 MED ORDER — NOVOLOG 100 UNIT/ML IJ SOLN
200.0000 [IU] | Freq: Every day | INTRAMUSCULAR | 5 refills | Status: DC
  Filled 2023-07-31 – 2024-02-04 (×12): qty 60, 30d supply, fill #0

## 2023-07-31 ENCOUNTER — Other Ambulatory Visit: Payer: Self-pay

## 2023-07-31 ENCOUNTER — Other Ambulatory Visit (HOSPITAL_BASED_OUTPATIENT_CLINIC_OR_DEPARTMENT_OTHER): Payer: Self-pay

## 2023-08-01 ENCOUNTER — Other Ambulatory Visit (HOSPITAL_COMMUNITY): Payer: Self-pay

## 2023-08-03 ENCOUNTER — Other Ambulatory Visit: Payer: Self-pay

## 2023-08-04 ENCOUNTER — Other Ambulatory Visit: Payer: Self-pay

## 2023-08-04 ENCOUNTER — Other Ambulatory Visit (HOSPITAL_COMMUNITY): Payer: Self-pay

## 2023-08-04 MED ORDER — DEXCOM G7 SENSOR MISC
90 refills | Status: AC
  Filled 2023-08-04 (×2): qty 9, 90d supply, fill #0
  Filled 2023-10-16: qty 9, 90d supply, fill #1
  Filled 2024-01-24: qty 9, 90d supply, fill #2

## 2023-08-05 ENCOUNTER — Other Ambulatory Visit: Payer: Self-pay

## 2023-08-06 ENCOUNTER — Other Ambulatory Visit: Payer: Self-pay

## 2023-08-07 ENCOUNTER — Other Ambulatory Visit (HOSPITAL_COMMUNITY): Payer: Self-pay

## 2023-08-08 ENCOUNTER — Other Ambulatory Visit (HOSPITAL_COMMUNITY): Payer: Self-pay

## 2023-08-10 ENCOUNTER — Other Ambulatory Visit (HOSPITAL_COMMUNITY): Payer: Self-pay

## 2023-08-11 ENCOUNTER — Other Ambulatory Visit: Payer: Self-pay

## 2023-08-12 ENCOUNTER — Other Ambulatory Visit: Payer: Self-pay

## 2023-08-19 DIAGNOSIS — H35372 Puckering of macula, left eye: Secondary | ICD-10-CM | POA: Diagnosis not present

## 2023-08-19 DIAGNOSIS — E113411 Type 2 diabetes mellitus with severe nonproliferative diabetic retinopathy with macular edema, right eye: Secondary | ICD-10-CM | POA: Diagnosis not present

## 2023-08-19 DIAGNOSIS — H359 Unspecified retinal disorder: Secondary | ICD-10-CM | POA: Diagnosis not present

## 2023-08-19 DIAGNOSIS — H35031 Hypertensive retinopathy, right eye: Secondary | ICD-10-CM | POA: Diagnosis not present

## 2023-08-19 DIAGNOSIS — H35351 Cystoid macular degeneration, right eye: Secondary | ICD-10-CM | POA: Diagnosis not present

## 2023-08-19 DIAGNOSIS — E113412 Type 2 diabetes mellitus with severe nonproliferative diabetic retinopathy with macular edema, left eye: Secondary | ICD-10-CM | POA: Diagnosis not present

## 2023-08-20 ENCOUNTER — Other Ambulatory Visit (HOSPITAL_COMMUNITY): Payer: Self-pay

## 2023-08-24 DIAGNOSIS — E782 Mixed hyperlipidemia: Secondary | ICD-10-CM | POA: Diagnosis not present

## 2023-08-24 DIAGNOSIS — L4050 Arthropathic psoriasis, unspecified: Secondary | ICD-10-CM | POA: Diagnosis not present

## 2023-08-24 DIAGNOSIS — N182 Chronic kidney disease, stage 2 (mild): Secondary | ICD-10-CM | POA: Diagnosis not present

## 2023-08-24 DIAGNOSIS — M150 Primary generalized (osteo)arthritis: Secondary | ICD-10-CM | POA: Diagnosis not present

## 2023-08-24 DIAGNOSIS — M545 Low back pain, unspecified: Secondary | ICD-10-CM | POA: Diagnosis not present

## 2023-08-24 DIAGNOSIS — D696 Thrombocytopenia, unspecified: Secondary | ICD-10-CM | POA: Diagnosis not present

## 2023-08-24 DIAGNOSIS — I10 Essential (primary) hypertension: Secondary | ICD-10-CM | POA: Diagnosis not present

## 2023-08-24 DIAGNOSIS — E113413 Type 2 diabetes mellitus with severe nonproliferative diabetic retinopathy with macular edema, bilateral: Secondary | ICD-10-CM | POA: Diagnosis not present

## 2023-08-24 DIAGNOSIS — Z79899 Other long term (current) drug therapy: Secondary | ICD-10-CM | POA: Diagnosis not present

## 2023-08-24 DIAGNOSIS — E1140 Type 2 diabetes mellitus with diabetic neuropathy, unspecified: Secondary | ICD-10-CM | POA: Diagnosis not present

## 2023-08-24 DIAGNOSIS — M810 Age-related osteoporosis without current pathological fracture: Secondary | ICD-10-CM | POA: Diagnosis not present

## 2023-08-24 DIAGNOSIS — I700 Atherosclerosis of aorta: Secondary | ICD-10-CM | POA: Diagnosis not present

## 2023-08-26 DIAGNOSIS — E113411 Type 2 diabetes mellitus with severe nonproliferative diabetic retinopathy with macular edema, right eye: Secondary | ICD-10-CM | POA: Diagnosis not present

## 2023-08-26 DIAGNOSIS — E113412 Type 2 diabetes mellitus with severe nonproliferative diabetic retinopathy with macular edema, left eye: Secondary | ICD-10-CM | POA: Diagnosis not present

## 2023-09-10 DIAGNOSIS — E1165 Type 2 diabetes mellitus with hyperglycemia: Secondary | ICD-10-CM | POA: Diagnosis not present

## 2023-09-22 DIAGNOSIS — H35031 Hypertensive retinopathy, right eye: Secondary | ICD-10-CM | POA: Diagnosis not present

## 2023-09-22 DIAGNOSIS — H35351 Cystoid macular degeneration, right eye: Secondary | ICD-10-CM | POA: Diagnosis not present

## 2023-09-22 DIAGNOSIS — E113412 Type 2 diabetes mellitus with severe nonproliferative diabetic retinopathy with macular edema, left eye: Secondary | ICD-10-CM | POA: Diagnosis not present

## 2023-09-22 DIAGNOSIS — H35372 Puckering of macula, left eye: Secondary | ICD-10-CM | POA: Diagnosis not present

## 2023-09-23 DIAGNOSIS — R600 Localized edema: Secondary | ICD-10-CM | POA: Diagnosis not present

## 2023-09-23 DIAGNOSIS — M545 Low back pain, unspecified: Secondary | ICD-10-CM | POA: Diagnosis not present

## 2023-09-23 DIAGNOSIS — L4050 Arthropathic psoriasis, unspecified: Secondary | ICD-10-CM | POA: Diagnosis not present

## 2023-09-23 DIAGNOSIS — M810 Age-related osteoporosis without current pathological fracture: Secondary | ICD-10-CM | POA: Diagnosis not present

## 2023-09-23 DIAGNOSIS — M150 Primary generalized (osteo)arthritis: Secondary | ICD-10-CM | POA: Diagnosis not present

## 2023-09-23 DIAGNOSIS — Z79899 Other long term (current) drug therapy: Secondary | ICD-10-CM | POA: Diagnosis not present

## 2023-10-01 DIAGNOSIS — M810 Age-related osteoporosis without current pathological fracture: Secondary | ICD-10-CM | POA: Diagnosis not present

## 2023-10-08 DIAGNOSIS — E118 Type 2 diabetes mellitus with unspecified complications: Secondary | ICD-10-CM | POA: Diagnosis not present

## 2023-10-08 DIAGNOSIS — E782 Mixed hyperlipidemia: Secondary | ICD-10-CM | POA: Diagnosis not present

## 2023-10-15 ENCOUNTER — Other Ambulatory Visit (HOSPITAL_COMMUNITY): Payer: Self-pay | Admitting: Endocrinology

## 2023-10-15 DIAGNOSIS — I10 Essential (primary) hypertension: Secondary | ICD-10-CM | POA: Diagnosis not present

## 2023-10-15 DIAGNOSIS — E1140 Type 2 diabetes mellitus with diabetic neuropathy, unspecified: Secondary | ICD-10-CM | POA: Diagnosis not present

## 2023-10-15 DIAGNOSIS — E1165 Type 2 diabetes mellitus with hyperglycemia: Secondary | ICD-10-CM | POA: Diagnosis not present

## 2023-10-15 DIAGNOSIS — E782 Mixed hyperlipidemia: Secondary | ICD-10-CM | POA: Diagnosis not present

## 2023-10-15 DIAGNOSIS — D3501 Benign neoplasm of right adrenal gland: Secondary | ICD-10-CM | POA: Diagnosis not present

## 2023-10-15 DIAGNOSIS — M810 Age-related osteoporosis without current pathological fracture: Secondary | ICD-10-CM | POA: Diagnosis not present

## 2023-10-15 DIAGNOSIS — Z794 Long term (current) use of insulin: Secondary | ICD-10-CM | POA: Diagnosis not present

## 2023-10-15 DIAGNOSIS — E118 Type 2 diabetes mellitus with unspecified complications: Secondary | ICD-10-CM

## 2023-10-15 DIAGNOSIS — E113413 Type 2 diabetes mellitus with severe nonproliferative diabetic retinopathy with macular edema, bilateral: Secondary | ICD-10-CM | POA: Diagnosis not present

## 2023-10-15 DIAGNOSIS — E559 Vitamin D deficiency, unspecified: Secondary | ICD-10-CM | POA: Diagnosis not present

## 2023-10-15 DIAGNOSIS — Z9641 Presence of insulin pump (external) (internal): Secondary | ICD-10-CM | POA: Diagnosis not present

## 2023-10-16 ENCOUNTER — Other Ambulatory Visit (HOSPITAL_COMMUNITY): Payer: Self-pay

## 2023-10-19 ENCOUNTER — Other Ambulatory Visit (HOSPITAL_COMMUNITY): Payer: Self-pay

## 2023-10-20 ENCOUNTER — Other Ambulatory Visit: Payer: Self-pay

## 2023-10-21 DIAGNOSIS — L4050 Arthropathic psoriasis, unspecified: Secondary | ICD-10-CM | POA: Diagnosis not present

## 2023-10-29 ENCOUNTER — Ambulatory Visit (HOSPITAL_BASED_OUTPATIENT_CLINIC_OR_DEPARTMENT_OTHER)
Admission: RE | Admit: 2023-10-29 | Discharge: 2023-10-29 | Disposition: A | Discharge status: Home / Self Care | Payer: Self-pay | Source: Ambulatory Visit | Attending: Endocrinology | Admitting: Endocrinology

## 2023-10-29 DIAGNOSIS — E118 Type 2 diabetes mellitus with unspecified complications: Secondary | ICD-10-CM | POA: Insufficient documentation

## 2023-11-03 DIAGNOSIS — E113411 Type 2 diabetes mellitus with severe nonproliferative diabetic retinopathy with macular edema, right eye: Secondary | ICD-10-CM | POA: Diagnosis not present

## 2023-11-03 DIAGNOSIS — H35351 Cystoid macular degeneration, right eye: Secondary | ICD-10-CM | POA: Diagnosis not present

## 2023-11-03 DIAGNOSIS — E113412 Type 2 diabetes mellitus with severe nonproliferative diabetic retinopathy with macular edema, left eye: Secondary | ICD-10-CM | POA: Diagnosis not present

## 2023-11-03 DIAGNOSIS — H359 Unspecified retinal disorder: Secondary | ICD-10-CM | POA: Diagnosis not present

## 2023-11-03 DIAGNOSIS — H04122 Dry eye syndrome of left lacrimal gland: Secondary | ICD-10-CM | POA: Diagnosis not present

## 2023-11-03 DIAGNOSIS — H35372 Puckering of macula, left eye: Secondary | ICD-10-CM | POA: Diagnosis not present

## 2023-11-03 DIAGNOSIS — H35031 Hypertensive retinopathy, right eye: Secondary | ICD-10-CM | POA: Diagnosis not present

## 2023-11-12 ENCOUNTER — Ambulatory Visit (HOSPITAL_BASED_OUTPATIENT_CLINIC_OR_DEPARTMENT_OTHER): Admitting: Nurse Practitioner

## 2023-11-12 ENCOUNTER — Encounter (HOSPITAL_BASED_OUTPATIENT_CLINIC_OR_DEPARTMENT_OTHER): Payer: Self-pay | Admitting: Nurse Practitioner

## 2023-11-12 VITALS — BP 160/84 | HR 73 | Ht <= 58 in | Wt 182.2 lb

## 2023-11-12 DIAGNOSIS — Z7189 Other specified counseling: Secondary | ICD-10-CM

## 2023-11-12 DIAGNOSIS — Z794 Long term (current) use of insulin: Secondary | ICD-10-CM | POA: Diagnosis not present

## 2023-11-12 DIAGNOSIS — R9431 Abnormal electrocardiogram [ECG] [EKG]: Secondary | ICD-10-CM

## 2023-11-12 DIAGNOSIS — E785 Hyperlipidemia, unspecified: Secondary | ICD-10-CM | POA: Diagnosis not present

## 2023-11-12 DIAGNOSIS — R931 Abnormal findings on diagnostic imaging of heart and coronary circulation: Secondary | ICD-10-CM | POA: Diagnosis not present

## 2023-11-12 DIAGNOSIS — R002 Palpitations: Secondary | ICD-10-CM

## 2023-11-12 DIAGNOSIS — R0609 Other forms of dyspnea: Secondary | ICD-10-CM | POA: Diagnosis not present

## 2023-11-12 DIAGNOSIS — E1169 Type 2 diabetes mellitus with other specified complication: Secondary | ICD-10-CM | POA: Diagnosis not present

## 2023-11-12 DIAGNOSIS — I10 Essential (primary) hypertension: Secondary | ICD-10-CM | POA: Diagnosis not present

## 2023-11-12 MED ORDER — DIPHENHYDRAMINE HCL 50 MG PO CAPS: ORAL_CAPSULE | ORAL | 0 refills | Status: DC

## 2023-11-12 MED ORDER — DILTIAZEM HCL ER COATED BEADS 240 MG PO CP24: 240.0000 mg | ORAL_CAPSULE | Freq: Every day | ORAL | 3 refills | Status: DC

## 2023-11-12 MED ORDER — PREDNISONE 50 MG PO TABS: ORAL_TABLET | ORAL | 0 refills | Status: DC

## 2023-11-12 MED ORDER — METOPROLOL TARTRATE 50 MG PO TABS: 50.0000 mg | ORAL_TABLET | Freq: Once | ORAL | 0 refills | Status: DC

## 2023-11-12 NOTE — Progress Notes (Unsigned)
 Cardiology Office Note:  .   Date:  11/13/2023 ID:  Shannon Craig, DOB 1950-03-10, MRN [id number] PCP: Verdia Lombard, MD [facility] Providers Cardiologist:  None   Patient Profile: .      PMH Type 2 diabetes on insulin  pump Hyperlipidemia Rheumatoid arthritis Psoriatic arthritis Osteoarthritis Hypertension Coronary artery calcification CT Calcium score 10/29/23 CAC score 1505 (98th percentile) LM 105, LAD 873, LCx 264, RCA 263     History of Present Illness: .    Discussed the use of AI scribe software for clinical note transcription with the patient, who gave verbal consent to proceed.  History of Present Illness Shannon Craig is a very pleasant 73 year old female who presents for new patient evaluation for elevated coronary calcium score. She experiences exertional dyspnea, especially when walking uphill or doing yard work. She often needs to stop to catch her breath and sometimes feels her heart beating hard, though not fast. She occasionally hears her heartbeat while lying down.  She denies chest pain, orthopnea, PND. Her calcium score is 1505 with calcification in all 4 major coronary arteries, the bulk of which is in the left anterior descending artery.  She is on an insulin  pump for management of her type 2 diabetes as well as Ozempic.  Her A1c recently improved from 8% to 6.8%.  She was recently started on Repatha by her endocrinologist for management of hyperlipidemia.  She reports cholesterol was not as high in the past and had been managed with rosuvastatin.  It spiked recently.  Family history includes heart disease and stroke. Her brother has factor V Leiden, her father died from a heart attack following a blood clot, and her mother had strokes and TIAs. Her diet includes red meat from a local farmer along with fruits and vegetables. She drinks whole milk and she admits she is a 'carb addict.'  She generally eats three meals a day with occasional snacks. She does not  consume alcohol. She reports occasional high blood pressure readings at home as well as in clinic prior to injections for RA.  Particularly during Cimzia  injections, but does not regularly monitor her blood pressure at home.  She does not participate in any formal exercise but is active at home helping her husband maintain 3 acres of land.    Family history: Her family history includes Breast cancer in her mother; Diabetes in her brother and father; Heart attack in her maternal grandfather; Prostate cancer in her maternal uncle; Stroke in her maternal grandmother.   Discussed the use of AI scribe software for clinical note transcription with the patient, who gave verbal consent to proceed.   The 10-year ASCVD risk score (Arnett DK, et al., 2019) is: 40.3%   Values used to calculate the score:     Age: 29 years     Clincally relevant sex: Female     Is Non-Hispanic African American: No     Diabetic: Yes     Tobacco smoker: No     Systolic Blood Pressure: 160 mmHg     Is BP treated: Yes     HDL Cholesterol: 45 mg/dL     Total Cholesterol: 188 mg/dL    Diet: Red meat often, but from a local farmer I'm a carb addict  Eats 3 meals per day No Etoh  Activity: Helps her husband manage 3 acres of land   No results found for: LIPOA    ROS: See HPI  Studies Reviewed: SABRA   EKG Interpretation Date/Time:  Thursday November 12 2023 14:01:44 EDT Ventricular Rate:  73 PR Interval:  156 QRS Duration:  74 QT Interval:  364 QTC Calculation: 401 R Axis:   -41  Text Interpretation: Normal sinus rhythm Left axis deviation Inferior infarct , age undetermined Possible Anterior infarct , age undetermined No previous ECGs available Confirmed by Percy Browning [phone]) on 11/12/2023 2:14:17 PM      Risk Assessment/Calculations:     HYPERTENSION CONTROL Vitals:   11/12/23 1357 11/12/23 1444  BP: (!) 192/94 (!) 160/84    The patient's blood pressure is elevated above target  today.  In order to address the patient's elevated BP: A current anti-hypertensive medication was adjusted today.          Physical Exam:   VS: BP (!) 160/84   Pulse 73   Ht 4' 7 (1.397 m)   Wt 182 lb 3.2 oz (82.6 kg)   SpO2 96%   BMI 42.35 kg/m   Wt Readings from Last 3 Encounters:  11/12/23 182 lb 3.2 oz (82.6 kg)  07/25/19 160 lb (72.6 kg)  04/09/17 138 lb 14.2 oz (63 kg)     GEN: Obese, well developed in no acute distress NECK: No JVD; No carotid bruits CARDIAC: RRR, no murmurs, rubs, gallops RESPIRATORY:  Clear to auscultation without rales, wheezing or rhonchi  ABDOMEN: Soft, non-tender, non-distended EXTREMITIES:  No edema; No deformity     ASSESSMENT AND PLAN: .    Assessment & Plan Coronary artery calcification   Abnormal EKG Cardiac risk CT calcium score of 1505 (98th percentile) with 4 major coronary arteries, bulk of which is in LAD.  She is having exertional dyspnea and palpitations.  ASCVD risk score is significantly elevated at 40.3%.  She has additional risk factors of insulin -dependent type 2 diabetes, hyperlipidemia, and hypertension.  She also has family history of ASCVD.  No chest pain.  EKG today reveals NSR at 73 bpm, inferior infarct age undetermined, possible anterior infarct.  No prior EKG for comparison.  We will get further ischemia evaluation. LDL is not at goal, she recently started Repatha for management of hyperlipidemia. We discussed ways for her to reduce simple carbohydrates. -Start aspirin 81 mg daily -Order coronary CTA for ischemia evaluation -Lopressor  50 mg 2 hours prior to CT -Continue Repatha, rosuvastatin, ramipril - Recommend heart healthy diet avoiding saturated fat, processed foods, sugar, and other simple carbohydrates -Maintain a healthy lifestyle  Palpitations Exertional dyspnea   Symptoms of palpitations and DOE that occur with minimal to moderate exertion.  She denies chest pain, orthopnea, PND, edema, presyncope, or  syncope. Body habitus makes it difficult to assess volume but she denies weight gain.  - We will get coronary CTA to evaluate for ischemia - Consider echocardiogram if CT is unrevealing  Hypertension   BP is elevated in clinic with slight improvement on my recheck.  She admits BP has been elevated at recent clinic appointments and at home. Management is crucial due to high cardiovascular risk profile.  -We will increase diltiazem  to 240 mg daily -Continue ramipril - Monitor home blood pressure for goal < 140/80 mmHg  Hyperlipidemia LDL goal < 70   Lipid panel completed 10/08/2023 with total cholesterol 188, HDL, LDL 116, and triglycerides 153.  She reports cholesterol has been well-controlled on rosuvastatin for many years, but unfortunately has been elevated recently.  She was started on Repatha by her endocrinologist.  She is tolerating Repatha without any  concerning side effects. She is going to notify me if repeat lab testing is not ordered by endocrinologist.  -Continue Repatha and rosuvastatin -Obtain lipoprotein a test with upcoming lab work - Re-evaluate lipid levels after four Repatha injections  Type 2 diabetes mellitus, on insulin  therapy   Glycemic control improved recently with HbA1c reduced to 6.8% from 8%. No acute concerns today.  -Dietary modifications discussed -Management per endocrinology        Dispo: 3-4 months with me  Signed, Rosaline Bane, NP-C

## 2023-11-12 NOTE — Patient Instructions (Signed)
 Medication Instructions:   INCREASE Diltiazem  one (1) tablet by mouth ( 240 mg) every evening.    *If you need a refill on your cardiac medications before your next appointment, please call your pharmacy*  Lab Work:  Check when you get home to see if Dr. Tommas is having a fasting lipid panel and will they add a LPa to your fasting labs.   If you have labs (blood work) drawn today and your tests are completely normal, you will receive your results only by: MyChart Message (if you have MyChart) OR A paper copy in the mail If you have any lab test that is abnormal or we need to change your treatment, we will call you to review the results.  Testing/Procedures:    Your cardiac CT will be scheduled at one of the below locations:   [facility] [address] [place], [place] [address] [phone] (Severe contrast allergies only)  If scheduled at the [facility] at [address], please enter the parking lot using the [address] entrance and use the FREE valet service at the patient drop-off area. Enter the building and check-in with registration on the main floor.   Please follow these instructions carefully (unless otherwise directed):   On the Night Before the Test: Be sure to Drink plenty of water. Do not consume any caffeinated/decaffeinated beverages or chocolate 12 hours prior to your test. Do not take any antihistamines 12 hours prior to your test.   On the Day of the Test: Drink plenty of water until 1 hour prior to the test. Do not eat any food 1 hour prior to test. You may take your regular medications prior to the test.  Take metoprolol  (Lopressor ) two hours prior to test. Patients who wear a continuous glucose monitor MUST remove the device prior to scanning. FEMALES- please wear underwire-free bra if available, avoid dresses & tight clothing      After the Test: Drink plenty of water. After receiving IV contrast, you may  experience a mild flushed feeling. This is normal. On occasion, you may experience a mild rash up to 24 hours after the test. This is not dangerous. If this occurs, you can take Benadryl  25 mg, Zyrtec, Claritin, or Allegra and increase your fluid intake. (Patients taking Tikosyn should avoid Benadryl , and may take Zyrtec, Claritin, or Allegra) If you experience trouble breathing, this can be serious. If it is severe call 911 IMMEDIATELY. If it is mild, please call our office.  We will call to schedule your test 2-4 weeks out understanding that some insurance companies will need an authorization prior to the service being performed.   For more information and frequently asked questions, please visit our website : [email or web address]  For non-scheduling related questions, please contact the cardiac imaging nurse navigator should you have any questions/concerns: Cardiac Imaging Nurse Navigators Direct Office Dial: [phone]   For scheduling needs, including cancellations and rescheduling, please call [place], [phone].   Follow-Up: At [facility], you and your health needs are our priority.  As part of our continuing mission to provide you with exceptional heart care, our providers are all part of one team.  This team includes your primary Cardiologist (physician) and Advanced Practice Providers or APPs (Physician Assistants and Nurse Practitioners) who all work together to provide you with the care you need, when you need it.  Your next appointment:   3 month(s)  Provider:   Rosaline Bane, NP    We  recommend signing up for the patient portal called MyChart.  Sign up information is provided on this After Visit Summary.  MyChart is used to connect with patients for Virtual Visits (Telemedicine).  Patients are able to view lab/test results, encounter notes, upcoming appointments, etc.  Non-urgent messages can be sent to your provider as well.   To learn more  about what you can do with MyChart, go to [email or web address].   Other Instructions  Adopting a Healthy Lifestyle.   Weight: Know what a healthy weight is for you (roughly BMI <25) and aim to maintain this. You can calculate your body mass index on your smart phone. Unfortunately, this is not the most accurate measure of healthy weight, but it is the simplest measurement to use. A more accurate measurement involves body scanning which measures lean muscle, fat tissue and bony density. We do not have this equipment at [facility].    Diet: Aim for 7+ servings of fruits and vegetables daily Limit animal fats in diet for cholesterol and heart health - choose grass fed whenever available Avoid highly processed foods (fast food burgers, tacos, fried chicken, pizza, hot dogs, french fries)  Saturated fat comes in the form of butter, lard, coconut oil, margarine, partially hydrogenated oils, and fat in meat. These increase your risk of cardiovascular disease.  Use healthy plant oils, such as olive, canola, soy, corn, sunflower and peanut.  Whole foods such as fruits, vegetables and whole grains have fiber  Men need > 38 grams of fiber per day Women need > 25 grams of fiber per day  Load up on vegetables and fruits - one-half of your plate: Aim for color and variety, and remember that potatoes dont count. Go for whole grains - one-quarter of your plate: Whole wheat, barley, wheat berries, quinoa, oats, brown rice, and foods made with them. If you want pasta, go with whole wheat pasta. Protein power - one-quarter of your plate: Fish, chicken, beans, and nuts are all healthy, versatile protein sources. Limit red meat. You need carbohydrates for energy! The type of carbohydrate is more important than the amount. Choose carbohydrates such as vegetables, fruits, whole grains, beans, and nuts in the place of white rice, white pasta, potatoes (baked or fried), macaroni and cheese, cakes, cookies, and donuts.   If youre thirsty, drink water. Coffee and tea are good in moderation, but skip sugary drinks and limit milk and dairy products to one or two daily servings. Keep sugar intake at 6 teaspoons or 24 grams or LESS       Exercise: Aim for 150 min of moderate intensity exercise weekly for heart health, and weights twice weekly for bone health Stay active - any steps are better than no steps! Aim for 7-9 hours of sleep daily      Tackling Obesity with Lifestyle Changes  Obesity- What is it? And What can we do about it?  Obesity is a chronic complex disease defined as excessive fat deposits that can have a negative effect on our health. It can lead to many other diseases including type 2 diabetes.  Weight gain occurs when the amount of energy (calories) we consume is greater than the amount we use.  When our energy output is greater than our energy input we lose weight. The basic concept is simple, but in reality, it's much more complicated.  Unfortunately, in some people, our bodies have many ways it can compensate when we try to eat less and move more which can prevent us   from changing our weight. This can lead to some people having a much more difficult time losing weight even when they put healthy habits into practice. This can be frustrating. We want to focus on healthy habits, physical activity and how we feel, and less the number on the scale.  Food As Energy  Calories  Calories is just a unit of measurement for energy.  Counting calories is not required to lose weight but counting for a short period of time can:   help you learn good portion sizes   Learn what your true energy needs are.   Help you be more aware of your snacking or grazing habits  To help calculate how many calories you should be eating, the NIH has a great body weight planner calculator at BeverageBuggy.si  Types of Energy Expenditure  Basal Metabolic Rate (BMR) Energy that our bodies use to  preform everyday tasks. More muscle mass through resistance training can increase this a small amount  Thermic Effect of Food The amount of energy that it takes to breakdown the food we eat. This will be highest when we eat protein and fiber rich foods  Exercise Energy Expenditure The amount of energy used during formal exercise (walking, biking, weightlifting)  Non-exercise activity thermogenesis (NEAT) The amount of energy spent on activities that are not formal exercise (standing, fidgeting). Therefore, it is not only important to do formal exercise but also move around throughout the day.  Managing The Meal  Macro nutrients (carbohydrates, fats and protein, fiber, water)  Micronutrients (vitamins, minerals)  Dietary Fiber  Benefits Examples Cautions  Soluble fiber  Decreases cholesterol  improve blood sugar control,  Feeds our gut bacteria  Allows us  to feel fuller for longer so we eat less  fruits  oats  barley  legumes  peas  Beans  vegetables (broccoli) and root vegetables (carrots) Add fiber into your diet slowly and be sure to drink at least 8 cups of water a day. This will help limit gas, bloating, diarrhea, or constipation.  Insoluble Fiber  Improves digestive health by making stool easier to pass  Allows us  to feel fuller for longer so we eat less  whole grains  nuts  seeds  skin of fruit  vegetables (green beans, zucchini, cauliflower)  Tricks to add more fiber to your diet   Add beans (pinto, kidney, lima, navy and garbanzo) to salads, ground meat or brown rice   Add nuts or seeds and or fresh/frozen fruit to yogurt, cottage cheese, salads or steel cut oats   Cut up vegetables and eat with hummus   Look for unsweetened whole grain cereals with at least 5g of fiber per serving   Switch to whole grain bread. Look for bread that has whole grain flour as the first ingredient and has more fiber than carbs if you were to multiple the fiber x 10.   Try  bulgar, barely, quinoa, buckwheat, brown rice wild rice instead of white rice   Keep frozen vegetables on hand to add to dishes or soups  Meal Planning:  Meal planning is the key to setting you up for success. Here are some examples of healthy meal options.  Breakfast  Option 1: Omelette with vegetables (1 egg, spinach, mushrooms, or other vegetable of your choice), 2 slices whole-grain toast, tip of thumb size butter or soft margarine,  cup low-fat milk or yogurt  Option 2: steel-cut rolled oats (? cup dry), 1 tbsp peanut butter added to cooked oats,  cup low-fat milk.  Option 3: 2 slices whole-grain or rye toast with avocado spread ( small avocado mased with herbs and pepper to taste), 1 poached egg or [place] up (cooked to your liking)  Option 4:  cup plain 0% [place] yogurt topped with  cup berries and  cup walnuts or almonds, 2 slices whole-grain or rye toast, tip of thumb size soft margarine/butter  Lunch:  Option 1: 2 cups red lentil soup, green salad with 1 tbsp homemade vinaigrette (extra virgin olive oil and vinegar of choice plus spices)  Option 2: 3 oz. roasted chicken, 2 slices whole-grain bread, 2 tsp mayonnaise, mustard, lettuce, tomato if desired, 1 fruit (example: medium-sized apple or small pear)  Option 3: 3 oz. tuna packed in water, 1 whole-wheat pita (6 inch), 2 tsp mayonnaise, lettuce, tomato, or other non-starchy vegetable of your choice, 1 fruit (example: medium-sized apple or small pear)  Option 4: 1 serving of garden veggie buddha bowl with lentils and tahini sauce and 1 cup berries topped with  cup plain 0% Greek yogurt  Dinner:  Option 1: 1 serving roasted cauliflower salad, 3-4 oz. grilled or baked pork loin chop, 1/2 cup mashed potato, or brown rice or quinoa  Option 2: 1 serving fish (baked, grilled or air fried), green salad, 1 tbsp homemade vinaigrette,  cup cooked couscous  Option 3: 1 cup cooked whole grained pasta (example: spaghetti,  spirals, macaroni),  cup favorite pasta sauce (preferably homemade), 3-4 oz. grilled or baked chicken, green salad, 1 tbsp homemade vinaigrette  Option 4: 1 serving oven roasted salmon,  cup mashed sweet potato or couscous or brown rice or quinoa, broccoli (steamed or roasted)  Healthy snacks:   Carrots or celery with 1 tbsp of hummus   1 medium-sized fruit (apple or orange)   1 cup plain 0% [place] yogurt with  cup berries   Half apple, sliced, with 1 tbsp (15 mL) peanut or almond butter  Dining out:  Eating away from home has become a part of many people's lifestyle. Making healthy choices when you are eating out is important too. Portion size is an important part of healthy choices. Most branded fast-food places provide calories, sodium, and fat content for their menu items. www.calorieking.com would be great resource to find nutrition facts for your favorite brands and fast-food restaurants. Company specific website can be Chief Technology Officer for nutrition information for their items. (e.g. www.mcdonalds.com or www.nutritionix.com/[place]/menu/premium)  Here are some tips to help you make wise food choices when you are dining out.  Chose more often Avoid  Beverages   Choose more often: Water, low fat milk  Sugar-free/diet drinks  Unsweet tea or coffee    Avoid: Milkshakes, fruit drinks, regular pop  Alcohol, specialty drinks (e.g. iced cappuccino)  Fast food  Choose more often:  Garden salad  Mini subs, pita sandwiches ect with extra vegetables  plain burgers, grilled chicken  Vegetarian or cheese pizza with whole-grain crust    Avoid: Burgers/sandwiches with bacon, cheese, and high-fat sauces  [place] fries, fried chicken, fried fish, poutine, hash browns  Pizza with processed meats  Starters   Choose more often: Raw vegetables, salads (garden, spinach, fruit)  clear or vegetable soups  Seafood cocktail  Whole-grain breads and rolls    Avoid: Salads with  high-fat dressings or toppings  Creamy soups  Wings, egg rolls  onion rings, nachos  White or garlic bread  Main courses Grains & Starches (amount equal to  of your plate)  Choose  more often:  Oatmeal, high-fiber/lower-sugar cereals  Whole-grain breads, rice, pasta, barley, couscous  Sweet potatoes    Avoid: Sugary, low-fiber cereals  Large bagels, muffins, croissants, white bread  [place] fries, hash browns, fried rice   Meat and alternative (amount equal to  of your plate)  Choose more often:  Lean meats, poultry, fish, eggs, low-fat cheese  Tofu, vegetable protein Legumes (e.g. lentils, chickpeas, beans)    Avoid: High-salt and/or high-fat meats (e.g. ribs, wings, sausages, wieners, processed lunch meats, imposter meats)   Vegetables (amount equal to  of your plate)  Choose more often:  Salads ([place], garden, spinach), plain vegetables   Avoid:  Salads with creamy, high-fat dressings and   Vegetables on sandwiches ect toppings like bacon bits, croutons, cheese  Desserts  Choose more often:  Fresh fruit, frozen yogourt, skim milk latte    Avoid: Cakes, pies, pastries, ice cream, cheesecake

## 2023-11-13 ENCOUNTER — Encounter (HOSPITAL_BASED_OUTPATIENT_CLINIC_OR_DEPARTMENT_OTHER): Payer: Self-pay | Admitting: Nurse Practitioner

## 2023-11-13 ENCOUNTER — Encounter (HOSPITAL_BASED_OUTPATIENT_CLINIC_OR_DEPARTMENT_OTHER): Payer: Self-pay

## 2023-11-16 ENCOUNTER — Encounter (HOSPITAL_COMMUNITY): Payer: Self-pay

## 2023-11-17 DIAGNOSIS — E113413 Type 2 diabetes mellitus with severe nonproliferative diabetic retinopathy with macular edema, bilateral: Secondary | ICD-10-CM | POA: Diagnosis not present

## 2023-11-17 DIAGNOSIS — H04122 Dry eye syndrome of left lacrimal gland: Secondary | ICD-10-CM | POA: Diagnosis not present

## 2023-11-17 DIAGNOSIS — H35372 Puckering of macula, left eye: Secondary | ICD-10-CM | POA: Diagnosis not present

## 2023-11-17 DIAGNOSIS — H35031 Hypertensive retinopathy, right eye: Secondary | ICD-10-CM | POA: Diagnosis not present

## 2023-11-17 DIAGNOSIS — H359 Unspecified retinal disorder: Secondary | ICD-10-CM | POA: Diagnosis not present

## 2023-11-17 DIAGNOSIS — H35351 Cystoid macular degeneration, right eye: Secondary | ICD-10-CM | POA: Diagnosis not present

## 2023-11-18 ENCOUNTER — Ambulatory Visit (HOSPITAL_COMMUNITY)
Admission: RE | Admit: 2023-11-18 | Discharge: 2023-11-18 | Disposition: A | Discharge status: Home / Self Care | Source: Ambulatory Visit | Attending: Nurse Practitioner | Admitting: Nurse Practitioner

## 2023-11-18 DIAGNOSIS — R931 Abnormal findings on diagnostic imaging of heart and coronary circulation: Secondary | ICD-10-CM | POA: Insufficient documentation

## 2023-11-18 DIAGNOSIS — I2510 Atherosclerotic heart disease of native coronary artery without angina pectoris: Secondary | ICD-10-CM | POA: Diagnosis not present

## 2023-11-18 MED ORDER — NITROGLYCERIN 0.4 MG SL SUBL
0.8000 mg | SUBLINGUAL_TABLET | Freq: Once | SUBLINGUAL | Status: AC
  Administered 2023-11-18: 0.8 mg via SUBLINGUAL

## 2023-11-18 MED ORDER — IOHEXOL 350 MG/ML SOLN
100.0000 mL | Freq: Once | INTRAVENOUS | Status: AC | PRN
  Administered 2023-11-18: 100 mL via INTRAVENOUS

## 2023-11-19 ENCOUNTER — Ambulatory Visit (HOSPITAL_COMMUNITY)
Admission: RE | Admit: 2023-11-19 | Discharge: 2023-11-19 | Disposition: A | Discharge status: Home / Self Care | Source: Ambulatory Visit | Attending: Cardiology | Admitting: Cardiology

## 2023-11-19 ENCOUNTER — Ambulatory Visit: Payer: Self-pay | Admitting: Nurse Practitioner

## 2023-11-19 ENCOUNTER — Other Ambulatory Visit: Payer: Self-pay | Admitting: Cardiology

## 2023-11-19 DIAGNOSIS — I2510 Atherosclerotic heart disease of native coronary artery without angina pectoris: Secondary | ICD-10-CM | POA: Insufficient documentation

## 2023-11-19 DIAGNOSIS — R931 Abnormal findings on diagnostic imaging of heart and coronary circulation: Secondary | ICD-10-CM | POA: Insufficient documentation

## 2023-11-19 DIAGNOSIS — L4050 Arthropathic psoriasis, unspecified: Secondary | ICD-10-CM | POA: Diagnosis not present

## 2023-12-03 ENCOUNTER — Encounter (HOSPITAL_BASED_OUTPATIENT_CLINIC_OR_DEPARTMENT_OTHER): Payer: Self-pay

## 2023-12-03 DIAGNOSIS — I10 Essential (primary) hypertension: Secondary | ICD-10-CM

## 2023-12-03 MED ORDER — OLMESARTAN MEDOXOMIL 20 MG PO TABS: 20.0000 mg | ORAL_TABLET | Freq: Every day | ORAL | 1 refills | Status: DC

## 2023-12-03 MED ORDER — AMLODIPINE BESYLATE 5 MG PO TABS
5.0000 mg | ORAL_TABLET | Freq: Every day | ORAL | 1 refills | Status: AC
Start: 2023-12-03 — End: ?

## 2023-12-03 NOTE — Addendum Note (Signed)
 Addended by: PERCY ROSALINE HERO on: 12/03/2023 05:08 PM   Modules accepted: Orders

## 2023-12-11 DIAGNOSIS — E1165 Type 2 diabetes mellitus with hyperglycemia: Secondary | ICD-10-CM | POA: Diagnosis not present

## 2023-12-15 DIAGNOSIS — H04122 Dry eye syndrome of left lacrimal gland: Secondary | ICD-10-CM | POA: Diagnosis not present

## 2023-12-15 DIAGNOSIS — E113412 Type 2 diabetes mellitus with severe nonproliferative diabetic retinopathy with macular edema, left eye: Secondary | ICD-10-CM | POA: Diagnosis not present

## 2023-12-15 DIAGNOSIS — H359 Unspecified retinal disorder: Secondary | ICD-10-CM | POA: Diagnosis not present

## 2023-12-15 DIAGNOSIS — H35351 Cystoid macular degeneration, right eye: Secondary | ICD-10-CM | POA: Diagnosis not present

## 2023-12-15 DIAGNOSIS — H35031 Hypertensive retinopathy, right eye: Secondary | ICD-10-CM | POA: Diagnosis not present

## 2023-12-15 DIAGNOSIS — H35372 Puckering of macula, left eye: Secondary | ICD-10-CM | POA: Diagnosis not present

## 2023-12-15 DIAGNOSIS — E113411 Type 2 diabetes mellitus with severe nonproliferative diabetic retinopathy with macular edema, right eye: Secondary | ICD-10-CM | POA: Diagnosis not present

## 2023-12-16 DIAGNOSIS — E113411 Type 2 diabetes mellitus with severe nonproliferative diabetic retinopathy with macular edema, right eye: Secondary | ICD-10-CM | POA: Diagnosis not present

## 2023-12-17 DIAGNOSIS — L4050 Arthropathic psoriasis, unspecified: Secondary | ICD-10-CM | POA: Diagnosis not present

## 2023-12-18 ENCOUNTER — Ambulatory Visit: Payer: Self-pay | Admitting: Nurse Practitioner

## 2023-12-18 LAB — BASIC METABOLIC PANEL WITH GFR
BUN/Creatinine Ratio: 15 (ref 12–28)
BUN: 11 mg/dL (ref 8–27)
CO2: 25 mmol/L (ref 20–29)
Calcium: 9.1 mg/dL (ref 8.7–10.3)
Chloride: 101 mmol/L (ref 96–106)
Creatinine, Ser: 0.73 mg/dL (ref 0.57–1.00)
Glucose: 213 mg/dL — ABNORMAL HIGH (ref 70–99)
Potassium: 4.2 mmol/L (ref 3.5–5.2)
Sodium: 139 mmol/L (ref 134–144)
eGFR: 87 mL/min/1.73 (ref >59)

## 2024-01-05 DIAGNOSIS — N182 Chronic kidney disease, stage 2 (mild): Secondary | ICD-10-CM | POA: Diagnosis not present

## 2024-01-05 DIAGNOSIS — E113413 Type 2 diabetes mellitus with severe nonproliferative diabetic retinopathy with macular edema, bilateral: Secondary | ICD-10-CM | POA: Diagnosis not present

## 2024-01-05 DIAGNOSIS — E1140 Type 2 diabetes mellitus with diabetic neuropathy, unspecified: Secondary | ICD-10-CM | POA: Diagnosis not present

## 2024-01-05 DIAGNOSIS — M810 Age-related osteoporosis without current pathological fracture: Secondary | ICD-10-CM | POA: Diagnosis not present

## 2024-01-05 DIAGNOSIS — I10 Essential (primary) hypertension: Secondary | ICD-10-CM | POA: Diagnosis not present

## 2024-01-05 DIAGNOSIS — R5383 Other fatigue: Secondary | ICD-10-CM | POA: Diagnosis not present

## 2024-01-05 DIAGNOSIS — L4050 Arthropathic psoriasis, unspecified: Secondary | ICD-10-CM | POA: Diagnosis not present

## 2024-01-05 DIAGNOSIS — E782 Mixed hyperlipidemia: Secondary | ICD-10-CM | POA: Diagnosis not present

## 2024-01-05 DIAGNOSIS — I700 Atherosclerosis of aorta: Secondary | ICD-10-CM | POA: Diagnosis not present

## 2024-01-05 DIAGNOSIS — Z794 Long term (current) use of insulin: Secondary | ICD-10-CM | POA: Diagnosis not present

## 2024-01-11 ENCOUNTER — Other Ambulatory Visit: Payer: Self-pay

## 2024-01-11 ENCOUNTER — Other Ambulatory Visit (HOSPITAL_COMMUNITY): Payer: Self-pay

## 2024-01-11 MED ORDER — NOVOLOG 100 UNIT/ML IJ SOLN
200.0000 [IU] | Freq: Every day | INTRAMUSCULAR | 5 refills | Status: AC
  Filled 2024-01-11: qty 60, 30d supply, fill #0
  Filled 2024-03-05: qty 60, 30d supply, fill #1

## 2024-01-12 DIAGNOSIS — I10 Essential (primary) hypertension: Secondary | ICD-10-CM | POA: Diagnosis not present

## 2024-01-12 DIAGNOSIS — M810 Age-related osteoporosis without current pathological fracture: Secondary | ICD-10-CM | POA: Diagnosis not present

## 2024-01-12 DIAGNOSIS — L4050 Arthropathic psoriasis, unspecified: Secondary | ICD-10-CM | POA: Diagnosis not present

## 2024-01-12 DIAGNOSIS — E1140 Type 2 diabetes mellitus with diabetic neuropathy, unspecified: Secondary | ICD-10-CM | POA: Diagnosis not present

## 2024-01-12 DIAGNOSIS — E113413 Type 2 diabetes mellitus with severe nonproliferative diabetic retinopathy with macular edema, bilateral: Secondary | ICD-10-CM | POA: Diagnosis not present

## 2024-01-12 DIAGNOSIS — Z0000 Encounter for general adult medical examination without abnormal findings: Secondary | ICD-10-CM | POA: Diagnosis not present

## 2024-01-12 DIAGNOSIS — Z23 Encounter for immunization: Secondary | ICD-10-CM | POA: Diagnosis not present

## 2024-01-12 DIAGNOSIS — N182 Chronic kidney disease, stage 2 (mild): Secondary | ICD-10-CM | POA: Diagnosis not present

## 2024-01-12 DIAGNOSIS — Z794 Long term (current) use of insulin: Secondary | ICD-10-CM | POA: Diagnosis not present

## 2024-01-12 DIAGNOSIS — E782 Mixed hyperlipidemia: Secondary | ICD-10-CM | POA: Diagnosis not present

## 2024-01-15 DIAGNOSIS — E559 Vitamin D deficiency, unspecified: Secondary | ICD-10-CM | POA: Diagnosis not present

## 2024-01-15 DIAGNOSIS — E1140 Type 2 diabetes mellitus with diabetic neuropathy, unspecified: Secondary | ICD-10-CM | POA: Diagnosis not present

## 2024-01-15 DIAGNOSIS — Z4681 Encounter for fitting and adjustment of insulin pump: Secondary | ICD-10-CM | POA: Diagnosis not present

## 2024-01-15 DIAGNOSIS — E782 Mixed hyperlipidemia: Secondary | ICD-10-CM | POA: Diagnosis not present

## 2024-01-15 DIAGNOSIS — Z9641 Presence of insulin pump (external) (internal): Secondary | ICD-10-CM | POA: Diagnosis not present

## 2024-01-15 DIAGNOSIS — M810 Age-related osteoporosis without current pathological fracture: Secondary | ICD-10-CM | POA: Diagnosis not present

## 2024-01-15 DIAGNOSIS — L4050 Arthropathic psoriasis, unspecified: Secondary | ICD-10-CM | POA: Diagnosis not present

## 2024-01-15 DIAGNOSIS — I10 Essential (primary) hypertension: Secondary | ICD-10-CM | POA: Diagnosis not present

## 2024-01-15 DIAGNOSIS — E1165 Type 2 diabetes mellitus with hyperglycemia: Secondary | ICD-10-CM | POA: Diagnosis not present

## 2024-01-15 DIAGNOSIS — Z794 Long term (current) use of insulin: Secondary | ICD-10-CM | POA: Diagnosis not present

## 2024-01-19 DIAGNOSIS — E113412 Type 2 diabetes mellitus with severe nonproliferative diabetic retinopathy with macular edema, left eye: Secondary | ICD-10-CM | POA: Diagnosis not present

## 2024-01-19 DIAGNOSIS — H35351 Cystoid macular degeneration, right eye: Secondary | ICD-10-CM | POA: Diagnosis not present

## 2024-01-19 DIAGNOSIS — H04122 Dry eye syndrome of left lacrimal gland: Secondary | ICD-10-CM | POA: Diagnosis not present

## 2024-01-19 DIAGNOSIS — H35372 Puckering of macula, left eye: Secondary | ICD-10-CM | POA: Diagnosis not present

## 2024-01-19 DIAGNOSIS — H35031 Hypertensive retinopathy, right eye: Secondary | ICD-10-CM | POA: Diagnosis not present

## 2024-01-19 DIAGNOSIS — E113411 Type 2 diabetes mellitus with severe nonproliferative diabetic retinopathy with macular edema, right eye: Secondary | ICD-10-CM | POA: Diagnosis not present

## 2024-01-19 DIAGNOSIS — H353131 Nonexudative age-related macular degeneration, bilateral, early dry stage: Secondary | ICD-10-CM | POA: Diagnosis not present

## 2024-01-28 DIAGNOSIS — M810 Age-related osteoporosis without current pathological fracture: Secondary | ICD-10-CM | POA: Diagnosis not present

## 2024-01-28 DIAGNOSIS — L4050 Arthropathic psoriasis, unspecified: Secondary | ICD-10-CM | POA: Diagnosis not present

## 2024-01-28 DIAGNOSIS — Z79899 Other long term (current) drug therapy: Secondary | ICD-10-CM | POA: Diagnosis not present

## 2024-01-28 DIAGNOSIS — R600 Localized edema: Secondary | ICD-10-CM | POA: Diagnosis not present

## 2024-01-28 DIAGNOSIS — M150 Primary generalized (osteo)arthritis: Secondary | ICD-10-CM | POA: Diagnosis not present

## 2024-01-28 DIAGNOSIS — M545 Low back pain, unspecified: Secondary | ICD-10-CM | POA: Diagnosis not present

## 2024-02-04 ENCOUNTER — Other Ambulatory Visit: Payer: Self-pay

## 2024-02-22 ENCOUNTER — Encounter (HOSPITAL_BASED_OUTPATIENT_CLINIC_OR_DEPARTMENT_OTHER): Payer: Self-pay | Admitting: Nurse Practitioner

## 2024-02-22 ENCOUNTER — Ambulatory Visit (INDEPENDENT_AMBULATORY_CARE_PROVIDER_SITE_OTHER): Admitting: Nurse Practitioner

## 2024-02-22 VITALS — BP 132/68 | HR 77 | Ht <= 58 in | Wt 183.5 lb

## 2024-02-22 DIAGNOSIS — I2510 Atherosclerotic heart disease of native coronary artery without angina pectoris: Secondary | ICD-10-CM | POA: Diagnosis not present

## 2024-02-22 DIAGNOSIS — E1169 Type 2 diabetes mellitus with other specified complication: Secondary | ICD-10-CM

## 2024-02-22 DIAGNOSIS — Z7189 Other specified counseling: Secondary | ICD-10-CM | POA: Diagnosis not present

## 2024-02-22 DIAGNOSIS — Z794 Long term (current) use of insulin: Secondary | ICD-10-CM | POA: Diagnosis not present

## 2024-02-22 DIAGNOSIS — R911 Solitary pulmonary nodule: Secondary | ICD-10-CM | POA: Diagnosis not present

## 2024-02-22 DIAGNOSIS — R931 Abnormal findings on diagnostic imaging of heart and coronary circulation: Secondary | ICD-10-CM | POA: Diagnosis not present

## 2024-02-22 DIAGNOSIS — I10 Essential (primary) hypertension: Secondary | ICD-10-CM

## 2024-02-22 DIAGNOSIS — E785 Hyperlipidemia, unspecified: Secondary | ICD-10-CM

## 2024-02-22 DIAGNOSIS — R9431 Abnormal electrocardiogram [ECG] [EKG]: Secondary | ICD-10-CM

## 2024-02-22 NOTE — Patient Instructions (Signed)
 Medication Instructions:  Your physician recommends that you continue on your current medications as directed. Please refer to the Current Medication list given to you today.  *If you need a refill on your cardiac medications before your next appointment, please call your pharmacy*  Lab Work: NONE  Testing/Procedures: NONE  Follow-Up: At [facility], you and your health needs are our priority.  As part of our continuing mission to provide you with exceptional heart care, we have created designated Provider Care Teams.  These Care Teams include your primary Cardiologist (physician) and Advanced Practice Providers (APPs -  Physician Assistants and Nurse Practitioners) who all work together to provide you with the care you need, when you need it.  We recommend signing up for the patient portal called MyChart.  Sign up information is provided on this After Visit Summary.  MyChart is used to connect with patients for Virtual Visits (Telemedicine).  Patients are able to view lab/test results, encounter notes, upcoming appointments, etc.  Non-urgent messages can be sent to your provider as well.   To learn more about what you can do with MyChart, go to [email or web address].    Your next appointment:   6 month(s)  The format for your next appointment:   In Person  Provider:   ROSALINE RAMAN NP

## 2024-02-22 NOTE — Progress Notes (Signed)
 " Cardiology Office Note:  .   Date:  02/22/2024 ID:  Shannon Craig, DOB 09-12-1950, MRN [id number] PCP: Verdia Lombard, MD [facility] Providers Cardiologist:  None   Patient Profile: .      PMH Type 2 diabetes on insulin  pump Hyperlipidemia Rheumatoid arthritis Psoriatic arthritis Osteoarthritis Hypertension Aortic atherosclerosis Coronary artery calcification CT Calcium score 10/29/23 CAC score 1505 (98th percentile) LM 105, LAD 873, LCx 264, RCA 263   Referred to cardiology for evaluation of elevated coronary calcium score and seen by me on 11/12/2023. She was experiencing exertional dyspnea, especially when walking uphill or doing yard work, often needing to stop to catch her breath and sometimes feels her heart beating hard, though not fast. Occasionally hears her heartbeat while lying down. No chest pain, orthopnea, PND. Calcium score is 1505 with calcification in all 4 major coronary arteries, the bulk of which is in the left anterior descending artery.  Is on an insulin  pump for management of her type 2 diabetes as well as Ozempic.  Her A1c recently improved from 8% to 6.8%.  She was recently started on Repatha by her endocrinologist for management of hyperlipidemia.  Cholesterol had been well-managed for years with rosuvastatin but had increased recently. Family history includes heart disease and stroke. Her brother has factor V Leiden, her father died from a heart attack following a blood clot, and her mother had strokes and TIAs. Diet includes red meat from a local farmer along with fruits and vegetables. She drinks whole milk and she admits she is a 'carb addict.'  She generally eats three meals a day with occasional snacks, and does not consume alcohol. Occasional high blood pressure readings at home as well as in clinic prior to injections for RA.  Particularly during Cimzia  injections, but does not regularly monitor BP at home. No formal exercise but is active at home  helping her husband maintain 3 acres of land.  ASCVD risk score 51.5%.  Coronary CTA 11/18/2023 revealed mild to moderate CAD, with no significant stenosis (50 to 69% in proximal LAD, mid to distal LAD with mild focal mixed plaque, no significant stenosis by CT FFR. She sent elevated BP readings on 12/03/23 and diltiazem  and ramipril were stopped. She was advised to start amlodipine  5 mg daily and olmesartan  20 mg daily with improved BP readings 2 weeks later and stable kidney function.     History of Present Illness: .    Discussed the use of AI scribe software for clinical note transcription with the patient, who gave verbal consent to proceed.  History of Present Illness Shannon Craig is a very pleasant 73 year old female who is here today for follow-up of coronary artery disease. She continues to have exertional shortness of breath, particularly when walking fast such as from the parking lot but this has improved since the last visit.  She does not become is breathless with bending over.  She denies chest pain, orthopnea, PND, or edema. She recalls sharp pleuritic chest pain several times in the more distant past, but not recently. CT scan showed a stable 6 mm right middle lobe lung nodule, unchanged from prior imaging. She asks if this could be related to past secondhand smoke exposure.  Her first husband smoked 3 to 4 packs of cigarettes per day and passed away from cardiac arrest in 1990. She previously had episodes of very forceful heartbeats that have resolved with medication adjustment. Recent home blood pressures were in the 130s  over high 60s and better controlled than before, though she has not checked since before Christmas.  She is not having any concerning side effects with Repatha.  She has also been working on eating less simple carbohydrates and saturated fat.  We reviewed cardiac CT in detail.   No results found for: LIPOA    ROS: See HPI       Studies Reviewed: .           Risk Assessment/Calculations:             Physical Exam:   VS: BP 132/68 (BP Location: Right Arm, Patient Position: Sitting, Cuff Size: Normal)   Pulse 77   Ht 4' 7 (1.397 m)   Wt 183 lb 8 oz (83.2 kg)   SpO2 97%   BMI 42.65 kg/m   Wt Readings from Last 3 Encounters:  02/22/24 183 lb 8 oz (83.2 kg)  11/12/23 182 lb 3.2 oz (82.6 kg)  07/25/19 160 lb (72.6 kg)     GEN: Obese, well developed in no acute distress NECK: No JVD; No carotid bruits CARDIAC: RRR, no murmurs, rubs, gallops RESPIRATORY:  Clear to auscultation without rales, wheezing or rhonchi  ABDOMEN: Soft, non-tender, non-distended EXTREMITIES:  No edema; No deformity     ASSESSMENT AND PLAN: .    Assessment & Plan Coronary artery disease Abnormal EKG Cardiac risk CT calcium score of 1505 (98th percentile) with 4 major coronary arteries, bulk of which is in LAD.  She is having exertional dyspnea and palpitations. Prior EKG revealed possible anterior infarct. Coronary CTA  revealed moderate calcified plaque in the left anterior descending artery (50-69% stenosis) with no significant stenosis by CT FFR. She denies chest pain.  Palpitations have improved.  Continues to have DOE but symptoms are stable. No symptoms concerning for unstable angina. No indication for further ischemic evaluation at this time. Continue current medications to manage heart function and blood pressure.  Lipids are well-controlled on rosuvastatin and Repatha.  BP is well-controlled.  No bleeding concerns on aspirin. - We discussed red flag symptoms to report  -Continue Repatha, rosuvastatin, aspirin, amlodipine , olmesartan  - Heart healthy diet avoiding processed foods, saturated fat, sugar, and other simple carbohydrates encouraged - Be as physically active as possible every day and aim for at least 150 minutes of moderate intensity exercise each week  Hypertension   BP is well-controlled.  She reports improved BP readings at home since  medication adjustments were made in October 2025 with discontinuation of diltiazem  and ramipril and starting amlodipine  and olmesartan .  She is overall feeling well and no longer has palpitations which may have been secondary to higher BP.  No change in antihypertensive therapy today.  Renal function stable on labs completed 01/04/2024 - Low-sodium heart healthy diet avoiding saturated fat, processed foods recommended - Be as physically active as possible every day and aim for at least 150 minutes of moderate intensity exercise each week - Monitor home blood pressure for goal < 140/80 mmHg  Hyperlipidemia LDL goal < 55 LP(a) is not elevated.  Lipid panel completed 01/04/2024 with total cholesterol 94, HDL 49, triglycerides 106, and LDL 25.  Significant improvement in LDL from 116 on 10/08/2023.  She is not having any concerning side effects on Repatha and rosuvastatin. - Continue Repatha and rosuvastatin - Heart healthy diet avoiding processed foods, saturated fat, sugar, and other simple carbohydrates encouraged - Be as physically active as possible every day and aim for at least 150 minutes of moderate  intensity exercise each week  Type 2 diabetes mellitus, on insulin  therapy   Glycemic control improved recently with HbA1c reduced to 6.8% from 8%. No acute concerns today.  -Dietary modifications discussed -Management per endocrinology  Pulmonary nodule, right middle lobe   Reviewed CT findings that revealed 6 mm nodule remains unchanged from previous CT. noted to be benign by the radiologist.  No acute concerns today. - Monitor on future imaging        Dispo:6 months with me  Signed, Rosaline Bane, NP-C "

## 2024-03-06 ENCOUNTER — Other Ambulatory Visit: Payer: Self-pay

## 2024-03-07 ENCOUNTER — Other Ambulatory Visit: Payer: Self-pay

## 2024-03-08 ENCOUNTER — Other Ambulatory Visit: Payer: Self-pay

## 2024-03-13 ENCOUNTER — Other Ambulatory Visit (HOSPITAL_BASED_OUTPATIENT_CLINIC_OR_DEPARTMENT_OTHER): Payer: Self-pay | Admitting: Nurse Practitioner
